# Patient Record
Sex: Female | Born: 1985 | Race: Black or African American | Hispanic: No | Marital: Single | Smoking: Never smoker
Health system: Southern US, Community
[De-identification: ages and names within clinical notes are randomized; demographics above are authoritative.]

## PROBLEM LIST (undated history)

## (undated) DIAGNOSIS — F419 Anxiety disorder, unspecified: Secondary | ICD-10-CM

## (undated) DIAGNOSIS — R269 Unspecified abnormalities of gait and mobility: Secondary | ICD-10-CM

## (undated) DIAGNOSIS — R7303 Prediabetes: Secondary | ICD-10-CM

## (undated) DIAGNOSIS — R569 Unspecified convulsions: Secondary | ICD-10-CM

## (undated) DIAGNOSIS — G259 Extrapyramidal and movement disorder, unspecified: Secondary | ICD-10-CM

## (undated) DIAGNOSIS — G809 Cerebral palsy, unspecified: Secondary | ICD-10-CM

## (undated) HISTORY — DX: Extrapyramidal and movement disorder, unspecified: G25.9

## (undated) HISTORY — PX: CYSTECTOMY: SUR359

## (undated) HISTORY — DX: Unspecified abnormalities of gait and mobility: R26.9

---
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
## 2011-02-28 DIAGNOSIS — E282 Polycystic ovarian syndrome: Secondary | ICD-10-CM | POA: Insufficient documentation

## 2011-03-30 ENCOUNTER — Ambulatory Visit: Payer: Medicaid Other | Attending: Physical Medicine and Rehabilitation | Admitting: Physical Therapy

## 2011-03-30 DIAGNOSIS — R269 Unspecified abnormalities of gait and mobility: Secondary | ICD-10-CM | POA: Insufficient documentation

## 2011-03-30 DIAGNOSIS — IMO0001 Reserved for inherently not codable concepts without codable children: Secondary | ICD-10-CM | POA: Insufficient documentation

## 2011-04-19 ENCOUNTER — Ambulatory Visit: Payer: Medicaid Other | Attending: Physical Medicine and Rehabilitation | Admitting: Physical Therapy

## 2011-04-19 DIAGNOSIS — IMO0001 Reserved for inherently not codable concepts without codable children: Secondary | ICD-10-CM | POA: Insufficient documentation

## 2011-04-19 DIAGNOSIS — R269 Unspecified abnormalities of gait and mobility: Secondary | ICD-10-CM | POA: Insufficient documentation

## 2011-05-02 DIAGNOSIS — G250 Essential tremor: Secondary | ICD-10-CM | POA: Insufficient documentation

## 2011-05-02 DIAGNOSIS — G809 Cerebral palsy, unspecified: Secondary | ICD-10-CM | POA: Insufficient documentation

## 2011-05-03 ENCOUNTER — Ambulatory Visit: Payer: Medicaid Other | Admitting: Physical Therapy

## 2011-05-15 DIAGNOSIS — Z8659 Personal history of other mental and behavioral disorders: Secondary | ICD-10-CM | POA: Insufficient documentation

## 2011-05-15 DIAGNOSIS — F411 Generalized anxiety disorder: Secondary | ICD-10-CM | POA: Insufficient documentation

## 2011-05-15 DIAGNOSIS — G40A09 Absence epileptic syndrome, not intractable, without status epilepticus: Secondary | ICD-10-CM | POA: Insufficient documentation

## 2011-05-17 ENCOUNTER — Encounter: Payer: Medicaid Other | Admitting: Physical Therapy

## 2011-05-30 ENCOUNTER — Ambulatory Visit: Payer: Medicaid Other | Attending: Physical Medicine and Rehabilitation | Admitting: Physical Therapy

## 2011-05-30 DIAGNOSIS — IMO0001 Reserved for inherently not codable concepts without codable children: Secondary | ICD-10-CM | POA: Insufficient documentation

## 2011-05-30 DIAGNOSIS — R269 Unspecified abnormalities of gait and mobility: Secondary | ICD-10-CM | POA: Insufficient documentation

## 2012-10-23 ENCOUNTER — Emergency Department (HOSPITAL_COMMUNITY)
Admission: EM | Admit: 2012-10-23 | Discharge: 2012-10-23 | Disposition: A | Discharge status: Home / Self Care | Payer: Medicaid Other | Attending: Emergency Medicine | Admitting: Emergency Medicine

## 2012-10-23 ENCOUNTER — Emergency Department (HOSPITAL_COMMUNITY): Payer: Medicaid Other

## 2012-10-23 ENCOUNTER — Encounter (HOSPITAL_COMMUNITY): Payer: Self-pay | Admitting: Emergency Medicine

## 2012-10-23 DIAGNOSIS — Y9301 Activity, walking, marching and hiking: Secondary | ICD-10-CM | POA: Insufficient documentation

## 2012-10-23 DIAGNOSIS — S79919A Unspecified injury of unspecified hip, initial encounter: Secondary | ICD-10-CM | POA: Insufficient documentation

## 2012-10-23 DIAGNOSIS — X500XXA Overexertion from strenuous movement or load, initial encounter: Secondary | ICD-10-CM | POA: Insufficient documentation

## 2012-10-23 DIAGNOSIS — M79652 Pain in left thigh: Secondary | ICD-10-CM

## 2012-10-23 DIAGNOSIS — Y9229 Other specified public building as the place of occurrence of the external cause: Secondary | ICD-10-CM | POA: Insufficient documentation

## 2012-10-23 DIAGNOSIS — Z8669 Personal history of other diseases of the nervous system and sense organs: Secondary | ICD-10-CM | POA: Insufficient documentation

## 2012-10-23 HISTORY — DX: Unspecified convulsions: R56.9

## 2012-10-23 NOTE — ED Notes (Signed)
Notified RN of CBG 79

## 2012-10-23 NOTE — ED Provider Notes (Signed)
History     CSN: [id number]  Arrival date & time 10/23/12  1131   First MD Initiated Contact with Patient 10/23/12 1137      Chief Complaint  Patient presents with  . Fall    (Consider location/radiation/quality/duration/timing/severity/associated sxs/prior treatment) HPI Comments: Patient is a 27 year old female who presents with left thigh pain that started prior to arrival. Patient reports walking down the hall at school and having sudden onset of sharp, severe left thigh pain without radiation. She reports feeling a "pop" and her leg buckled. She caught herself and did not fall. She denies any known injury. No aggravating/alleviating factors. She did not take anything for pain. She denies current pain. Patient denies LOC or head trauma.    Past Medical History  Diagnosis Date  . Seizures     Past Surgical History  Procedure Laterality Date  . Cystectomy Left     leg    History reviewed. No pertinent family history.  History  Substance Use Topics  . Smoking status: Never Smoker   . Smokeless tobacco: Not on file  . Alcohol Use: No    OB History   Grav Para Term Preterm Abortions TAB SAB Ect Mult Living                  Review of Systems  Musculoskeletal: Positive for myalgias.  All other systems reviewed and are negative.    Allergies  Review of patient's allergies indicates no known allergies.  Home Medications  No current outpatient prescriptions on file.  BP 103/63  Pulse 51  Temp(Src) 98.3 F (36.8 C) (Oral)  Resp 18  SpO2 96%  Physical Exam  Nursing note and vitals reviewed. Constitutional: She is oriented to person, place, and time. She appears well-developed and well-nourished. No distress.  HENT:  Head: Normocephalic and atraumatic.  Eyes: Conjunctivae are normal.  Neck: Normal range of motion.  Cardiovascular: Normal rate, regular rhythm and intact distal pulses.  Exam reveals no gallop and no friction rub.   No murmur  heard. Pulmonary/Chest: Effort normal and breath sounds normal. She has no wheezes. She has no rales. She exhibits no tenderness.  Abdominal: Soft. She exhibits no distension. There is no tenderness. There is no rebound and no guarding.  Musculoskeletal: Normal range of motion.  No left thigh tenderness to palpation or obvious deformity.   Neurological: She is alert and oriented to person, place, and time. Coordination normal.  Strength and sensation equal and intact bilaterally. Speech is goal-oriented. Moves limbs without ataxia.   Skin: Skin is warm and dry.  Psychiatric: She has a normal mood and affect. Her behavior is normal.    ED Course  Procedures (including critical care time)  Labs Reviewed - No data to display Dg Hip Complete Left  10/23/2012  *RADIOLOGY REPORT*  Clinical Data: Fall, pain.  LEFT HIP - COMPLETE 2+ VIEW  Comparison: None.  Findings: Imaged bones, joints and soft tissues appear normal.  IMPRESSION: Negative exam.   Original Report Authenticated By: Holley Dexter, M.D.    Dg Femur Left  10/23/2012  *RADIOLOGY REPORT*  Clinical Data: Fall, pain.  LEFT FEMUR - 2 VIEW  Comparison: None.  Findings: Imaged bones, joints and soft tissues appear normal.  IMPRESSION: Normal study.   Original Report Authenticated By: Holley Dexter, M.D.      1. Left thigh pain       MDM  12:03 PM Xray pending. Patient declines pain medication at this time.  1:44 PM Xrays unremarkable for acute changes. Patient will be discharged without further evaluation. Patient denies current pain. Patient instructed to follow up with PCP for further evaluation as needed. Patient instructed to return to the ED with worsening or concerning symptoms. No signs of neurovascular compromise, no calf or popliteal tenderness.         Emilia Beck, PA-C 10/23/12 1356

## 2012-10-23 NOTE — ED Notes (Signed)
Patient transported to X-ray 

## 2012-10-23 NOTE — ED Notes (Signed)
BIB [facility]. Patient was at school [employer]), walking out of restroom when she felt her left leg "pop", causing her to buckle and loose her gait. Patient did not completely fall to ground. Hx of seizures (on Keppra), patient states she does not feel like she had a seizure.

## 2012-10-25 NOTE — ED Provider Notes (Signed)
Medical screening examination/treatment/procedure(s) were performed by non-physician practitioner and as supervising physician I was immediately available for consultation/collaboration.   Laray Anger, DO 10/25/12 1159

## 2012-12-03 ENCOUNTER — Ambulatory Visit (INDEPENDENT_AMBULATORY_CARE_PROVIDER_SITE_OTHER): Payer: Medicaid Other | Admitting: Nurse Practitioner

## 2012-12-03 ENCOUNTER — Encounter: Payer: Self-pay | Admitting: Nurse Practitioner

## 2012-12-03 VITALS — BP 111/76 | HR 87 | Ht 67.75 in | Wt 268.0 lb

## 2012-12-03 DIAGNOSIS — G250 Essential tremor: Secondary | ICD-10-CM | POA: Insufficient documentation

## 2012-12-03 DIAGNOSIS — G40209 Localization-related (focal) (partial) symptomatic epilepsy and epileptic syndromes with complex partial seizures, not intractable, without status epilepticus: Secondary | ICD-10-CM | POA: Insufficient documentation

## 2012-12-03 DIAGNOSIS — R269 Unspecified abnormalities of gait and mobility: Secondary | ICD-10-CM | POA: Insufficient documentation

## 2012-12-03 DIAGNOSIS — G252 Other specified forms of tremor: Secondary | ICD-10-CM

## 2012-12-03 MED ORDER — LEVETIRACETAM 1000 MG PO TABS: 1000.0000 mg | ORAL_TABLET | Freq: Two times a day (BID) | ORAL | Status: DC

## 2012-12-03 NOTE — Progress Notes (Signed)
HPI: Patient returns for followup after last visit with Dr. Anne Hahn 06/06/2012. She has a history of gait disorder and some cognitive slowing. EEG studies have been abnormal suggesting partial complex seizures. Patient is presently on Keppra thousand milligrams twice daily tolerating the medication without side effects or irritability. Patient is still somewhat tremulous, particularly of the right arm. Patient is not operating a motor vehicle. She had an episode of severe pain in the left leg back in April, went to the emergency room and x-ray of the hip and femur were both normal. She has not had recurrent episodes. She has no new neurologic complaints    ROS:   joint pain, tremor   Physical Exam General: well developed, obese female, seated, in no evident distress Head: head normocephalic and atraumatic. Oropharynx benign Neck: supple with no carotid  bruits Cardiovascular: regular rate and rhythm, no murmurs  Neurologic Exam Mental Status: Awake and fully alert. Oriented to place and time. Follows all commands. Speech is normal no aphasia or dysarthria is noted .  Cranial Nerves: Pupils equal, briskly reactive to light. Extraocular movements full without nystagmus. Visual fields full to confrontation. Hearing intact and symmetric to finger snap. Facial sensation intact. Face, tongue, palate move normally and symmetrically. Neck flexion and extension normal.  Motor: Normal bulk and tone. Normal strength in all tested extremity muscles. Sensory.: intact to touch and pinprick and vibratory.  Coordination: Rapid alternating movements normal in all extremities. Finger-to-nose and heel-to-shin performed accurately bilaterally. The patient is somewhat tremulous with arms Gait and Station: Arises from chair without difficulty. Stance is normal.  Able to heel, toe and slightly unsteady with tandem walk   Reflexes: 2+ and symmetric. Toes downgoing.     ASSESSMENT: Seizure disorder well  controlled Mild cognitive slowing     PLAN: Continue Keppra thousand twice daily Will renew. The patient is not operating a vehicle Call for any seizure activity Encouraged weight loss, nutritious diet, and exercise program for overall general health Followup in 6 months  Nilda Riggs, GNP-BC APRN

## 2012-12-03 NOTE — Progress Notes (Signed)
I have read the note, and I agree with the clinical assessment and plan.  

## 2012-12-03 NOTE — Patient Instructions (Addendum)
Will continue Keppra 1000 mg twice daily, we will renew Followup in 6 months Call for any seizure activity

## 2013-06-10 ENCOUNTER — Encounter (INDEPENDENT_AMBULATORY_CARE_PROVIDER_SITE_OTHER): Payer: Self-pay

## 2013-06-10 ENCOUNTER — Ambulatory Visit (INDEPENDENT_AMBULATORY_CARE_PROVIDER_SITE_OTHER): Payer: Medicaid Other | Admitting: Nurse Practitioner

## 2013-06-10 ENCOUNTER — Encounter: Payer: Self-pay | Admitting: Nurse Practitioner

## 2013-06-10 VITALS — BP 110/75 | HR 86 | Ht 68.0 in | Wt 255.0 lb

## 2013-06-10 DIAGNOSIS — G250 Essential tremor: Secondary | ICD-10-CM

## 2013-06-10 DIAGNOSIS — G40209 Localization-related (focal) (partial) symptomatic epilepsy and epileptic syndromes with complex partial seizures, not intractable, without status epilepticus: Secondary | ICD-10-CM

## 2013-06-10 DIAGNOSIS — R269 Unspecified abnormalities of gait and mobility: Secondary | ICD-10-CM

## 2013-06-10 MED ORDER — LEVETIRACETAM 1000 MG PO TABS: 1000.0000 mg | ORAL_TABLET | Freq: Two times a day (BID) | ORAL | Status: DC

## 2013-06-10 NOTE — Progress Notes (Signed)
I have read the note, and I agree with the clinical assessment and plan.  WILLIS,CHARLES KEITH   

## 2013-06-10 NOTE — Progress Notes (Signed)
[facility]  PATIENT: Shannon Travis DOB: [birth year]   REASON FOR VISIT: follow up for seizure disorder    HISTORY OF PRESENT ILLNESS:Shannon Travis, 27 year old returns for followup. She was last seen in the office 5/ 21/ 2014 areas it is no more than a year since she had seizure activity. She is currently well controlled on Keppra thousand milligrams twice daily. Patient is not operating a motor vehicle and has never learned to drive. She has no new neurologic complaints   HISTORY: She has a history of gait disorder and some cognitive slowing. EEG studies have been abnormal suggesting partial complex seizures. Patient is presently on Keppra thousand milligrams twice daily tolerating the medication without side effects or irritability. Patient is still somewhat tremulous, particularly of the right arm. Patient is not operating a motor vehicle. She had an episode of severe pain in the left leg back in April, went to the emergency room and x-ray of the hip and femur were both normal. She has not had recurrent episodes. She has no new neurologic complaints. 2012  EEG recording is abnormal secondary to bursts of sharp and slow-wave complexes consistent with generalized seizure discharges. 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     REVIEW OF SYSTEMS: Full 14 system review of systems performed and notable only for: all negative Constitutional: N/A  Cardiovascular: N/A  Ear/Nose/Throat: N/A  Skin: N/A  Eyes: N/A  Respiratory: N/A  Gastroitestinal: N/A  Hematology/Lymphatic: N/A  Endocrine: N/A Musculoskeletal:N/A  Allergy/Immunology: N/A  Neurological: N/A Psychiatric: N/A   ALLERGIES: No Known Allergies  HOME MEDICATIONS: Outpatient Prescriptions Prior to Visit  Medication Sig Dispense Refill  . levETIRAcetam  (KEPPRA) 1000 MG tablet Take 1 tablet (1,000 mg total) by mouth 2 (two) times daily.  60 tablet  6  . MedroxyPROGESTERone Acetate (DEPO-PROVERA IM) Inject into the muscle every 3 (three) months.       No facility-administered medications prior to visit.    PAST MEDICAL HISTORY: Past Medical History  Diagnosis Date  . Seizures   . Abnormality of gait   . Movement disorder     PAST SURGICAL HISTORY: Past Surgical History  Procedure Laterality Date  . Cystectomy Left     leg    FAMILY HISTORY: History reviewed. No pertinent family history.  SOCIAL HISTORY: History   Social History  . Marital Status: Single    Spouse Name: N/A    Number of Children: 0  . Years of Education: 12   Occupational History  . Not on file.   Social History Main Topics  . Smoking status: Never Smoker   . Smokeless tobacco: Never Used  . Alcohol Use: No  . Drug Use: No  . Sexual Activity: Not on file   Other Topics Concern  . Not on file   Social History Narrative   Patient lives at home with her mother Shannon Travis.    Patient is currently working part time.    Patient is single.    Patient has no children.    Patient has high school education.      PHYSICAL EXAM  Filed Vitals:   06/10/13 0938  BP: 110/75  Pulse: 86  Height: 5\' 8"  (1.727 m)  Weight: 255 lb (115.667 kg)   Body mass index is 38.78 kg/(m^2).  Generalized: Well developed, in no  acute distress   Neurological examination   Mentation: Alert oriented to time, place, history taking. Follows all commands speech and language fluent  Cranial nerve II-XII: Pupils were equal round reactive to light extraocular movements were full, visual field were full on confrontational test. Facial sensation and strength were normal. hearing was intact to finger rubbing bilaterally. Uvula tongue midline. head turning and shoulder shrug and were normal and symmetric.Tongue protrusion into cheek strength was normal. Motor: normal  bulk and tone, full strength in the BUE, BLE, fine finger movements normal, no pronator drift. No focal weakness Sensory: normal and symmetric to light touch, pinprick, and  vibration  Coordination: finger-nose-finger, heel-to-shin bilaterally, she is somewhat tremulous with the use of her right arm Reflexes: Brachioradialis 2/2, biceps 2/2, triceps 2/2, patellar 2/2, Achilles 2/2, plantar responses were flexor bilaterally. Gait and Station: Rising up from seated position without assistance, normal stance,  moderate stride, good arm swing, smooth turning, able to perform tiptoe, and heel walking without difficulty. Slightly unsteady with tandem  DIAGNOSTIC DATA (LABS, IMAGING, TESTING)  none to review  ASSESSMENT AND PLAN  27 y.o. year old female  has a past medical history of Seizures; Abnormality of gait; and Movement disorder. here to followup for seizure disorder. Currently well controlled on Keppra thousand twice daily.  Continue Keppra at current dose, will refill for 1 year F/U yearly and prn Shannon Travis, [facility], [facility], APRN  [facility] [address] [place], [place] [address] [phone]

## 2013-06-10 NOTE — Patient Instructions (Signed)
Continue Keppra at current dose, will refill for 1 year F/U yearly and prn

## 2013-09-07 ENCOUNTER — Telehealth: Payer: Self-pay | Admitting: Nurse Practitioner

## 2013-09-07 NOTE — Telephone Encounter (Signed)
[facility] year [facility]  I called back.  Mom said she did nt speak with the pharmacy, but will call them now and have them call us back if needed.

## 2013-09-07 NOTE — Telephone Encounter (Signed)
Pt's mom called, pt needs her levETIRAcetam (KEPPRA) 1000 MG tablet refilled please contact mom when called in pt is in school per mom.

## 2014-06-11 ENCOUNTER — Ambulatory Visit: Payer: Medicaid Other | Admitting: Nurse Practitioner

## 2014-06-17 ENCOUNTER — Encounter: Payer: Self-pay | Admitting: Nurse Practitioner

## 2014-06-28 ENCOUNTER — Other Ambulatory Visit: Payer: Self-pay | Admitting: Nurse Practitioner

## 2014-06-28 NOTE — Telephone Encounter (Signed)
Has appt in Feb

## 2014-08-11 ENCOUNTER — Ambulatory Visit: Payer: Medicaid Other | Admitting: Nurse Practitioner

## 2014-08-26 ENCOUNTER — Ambulatory Visit (INDEPENDENT_AMBULATORY_CARE_PROVIDER_SITE_OTHER): Payer: Medicaid Other | Admitting: Nurse Practitioner

## 2014-08-26 ENCOUNTER — Encounter: Payer: Self-pay | Admitting: Nurse Practitioner

## 2014-08-26 VITALS — BP 133/84 | HR 105 | Ht 68.0 in | Wt 275.0 lb

## 2014-08-26 DIAGNOSIS — R269 Unspecified abnormalities of gait and mobility: Secondary | ICD-10-CM

## 2014-08-26 DIAGNOSIS — G40209 Localization-related (focal) (partial) symptomatic epilepsy and epileptic syndromes with complex partial seizures, not intractable, without status epilepticus: Secondary | ICD-10-CM

## 2014-08-26 MED ORDER — LEVETIRACETAM 1000 MG PO TABS: 1000.0000 mg | ORAL_TABLET | Freq: Two times a day (BID) | ORAL | Status: DC

## 2014-08-26 NOTE — Patient Instructions (Signed)
Continue Keppra at current dose will refill for 1 year F/U yearly and when necessary

## 2014-08-26 NOTE — Progress Notes (Signed)
[facility]  PATIENT: Shannon Travis DOB: 04/16/1986   REASON FOR VISIT: follow up for seizure disorder HISTORY FROM:patient, MOM    HISTORY OF PRESENT ILLNESS:Shannon Travis, 29 year old returns for followup. She was last seen in the office 06/10/13. She has a history of a seizure disorder but has not had seizures in several years.  She is currently well controlled on Keppra thousand milligrams twice daily. Patient is not operating a motor vehicle and has never learned to drive. She has no new neurologic complaints   HISTORY: She has a history of gait disorder and some cognitive slowing. EEG studies have been abnormal suggesting partial complex seizures. Patient is presently on Keppra thousand milligrams twice daily tolerating the medication without side effects or irritability. Patient is still somewhat tremulous, particularly of the right arm. Patient is not operating a motor vehicle. She had an episode of severe pain in the left leg back in April, went to the emergency room and x-ray of the hip and femur were both normal. She has not had recurrent episodes. She has no new neurologic complaints. 2012 EEG recording is abnormal secondary to bursts of sharp and slow-wave complexes consistent with generalized seizure discharges. 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   REVIEW OF SYSTEMS: Full 14 system review of systems performed and notable only for those listed, all others are neg:  Constitutional: neg  Cardiovascular: neg Ear/Nose/Throat: neg  Skin: neg Eyes: neg Respiratory: neg Gastroitestinal: neg  Hematology/Lymphatic: neg  Endocrine: neg Musculo skeletal Walking difficulty Allergy/Immunology: neg Neurological Seizure disorder, tremors Psychiatric: neg Sleep : neg   ALLERGIES: No Known Allergies  HOME  MEDICATIONS: Outpatient Prescriptions Prior to Visit  Medication Sig Dispense Refill  . levETIRAcetam (KEPPRA) 1000 MG tablet TAKE ONE TABLET BY MOUTH TWICE DAILY 60 tablet 1  . MedroxyPROGESTERone Acetate (DEPO-PROVERA IM) Inject into the muscle every 3 (three) months.     No facility-administered medications prior to visit.    PAST MEDICAL HISTORY: Past Medical History  Diagnosis Date  . Seizures   . Abnormality of gait   . Movement disorder     PAST SURGICAL HISTORY: Past Surgical History  Procedure Laterality Date  . Cystectomy Left     leg    FAMILY HISTORY: Family History  Problem Relation Age of Onset  . Healthy Mother     SOCIAL HISTORY: History   Social History  . Marital Status: Single    Spouse Name: N/A  . Number of Children: 0  . Years of Education: 12   Occupational History  . Not on file.   Social History Main Topics  . Smoking status: Never Smoker   . Smokeless tobacco: Never Used  . Alcohol Use: No  . Drug Use: No  . Sexual Activity: Not on file   Other Topics Concern  . Not on file   Social History Narrative   Patient lives at home with her mother Shannon Travis.    Patient is currently working part time.    Patient is single.    Patient has no children.    Patient has high school education.      PHYSICAL EXAM  Filed Vitals:   08/26/14 1517  BP: 133/84  Pulse: 105  Height:  (1.727 m)  Weight: 275 lb (124.739 kg)   Body mass index is  41.82 kg/(m^2).  Generalized: Well developed, in no acute distress  Musculoskeletal: No deformity   Neurological examination  Mentation: Alert oriented to time, place, history taking. Attention span and concentration appropriate. Recent and remote memory intact.  Follows all commands speech and language fluent.  Cranial nerve II-XII:  Pupils were equal round reactive to light extraocular movements were full, visual field were full on confrontational test. Facial sensation and strength  were normal. hearing was intact to finger rubbing bilaterally. Uvula tongue midline. head turning and shoulder shrug were normal and symmetric.Tongue protrusion into cheek strength was normal. Motor: normal bulk and tone, full strength in the BUE, BLE, fine finger movements normal, no pronator drift. No focal weakness Sensory: normal and symmetric to light touch, pinprick, and  Vibration, proprioception  Coordination: finger-nose-finger, heel-to-shin bilaterally, no dysmetria, she is somewhat tremulous with the use of her right arm  Reflexes: Brachioradialis 2/2, biceps 2/2, triceps 2/2, patellar 2/2, Achilles 2/2, plantar responses were flexor bilaterally. Gait and Station: Rising up from seated position without assistance, normal stance,  moderate stride, good arm swing, smooth turning, able to perform tiptoe, and heel walking without difficulty. Tandem gait is mildly unsteady  DIAGNOSTIC DATA (LABS, IMAGING, TESTING) - ASSESSMENT AND PLAN  29 y.o. year old female  has a past medical history of Seizures; Abnormality of gait; and Movement disorder. here  to follow-up. Last seizure was several years ago currently well-controlled on Keppra thousand milligrams twice daily.  Continue Keppra at current dose will refill for 1 year F/U yearly and when necessary Nilda RiggsNancy Carolyn Martin, [facility]NP, [facility], APRN  [facility] [address], [address] [place], [place] [address] [phone]

## 2014-08-26 NOTE — Progress Notes (Signed)
I have read the note, and I agree with the clinical assessment and plan.  WILLIS,CHARLES KEITH   

## 2014-12-20 DIAGNOSIS — Z0289 Encounter for other administrative examinations: Secondary | ICD-10-CM | POA: Diagnosis not present

## 2015-08-23 ENCOUNTER — Telehealth: Payer: Self-pay | Admitting: *Deleted

## 2015-08-23 NOTE — Telephone Encounter (Signed)
Form,[place] [employer] assistant, radio received from front desk sent to [place] and Eber Jones 08/23/15.

## 2015-08-25 NOTE — Telephone Encounter (Signed)
Pt has appt 08-30-15 0800 will address when in.

## 2015-08-29 ENCOUNTER — Ambulatory Visit: Payer: Medicaid Other | Admitting: Nurse Practitioner

## 2015-08-30 ENCOUNTER — Ambulatory Visit (INDEPENDENT_AMBULATORY_CARE_PROVIDER_SITE_OTHER): Payer: Medicaid Other | Admitting: Nurse Practitioner

## 2015-08-30 ENCOUNTER — Encounter: Payer: Self-pay | Admitting: Nurse Practitioner

## 2015-08-30 VITALS — BP 116/80 | HR 92 | Ht 68.0 in | Wt 270.8 lb

## 2015-08-30 DIAGNOSIS — Z8659 Personal history of other mental and behavioral disorders: Secondary | ICD-10-CM | POA: Diagnosis not present

## 2015-08-30 DIAGNOSIS — G40209 Localization-related (focal) (partial) symptomatic epilepsy and epileptic syndromes with complex partial seizures, not intractable, without status epilepticus: Secondary | ICD-10-CM

## 2015-08-30 DIAGNOSIS — G809 Cerebral palsy, unspecified: Secondary | ICD-10-CM | POA: Diagnosis not present

## 2015-08-30 DIAGNOSIS — R269 Unspecified abnormalities of gait and mobility: Secondary | ICD-10-CM | POA: Diagnosis not present

## 2015-08-30 MED ORDER — LEVETIRACETAM 1000 MG PO TABS: 1000.0000 mg | ORAL_TABLET | Freq: Two times a day (BID) | ORAL | Status: DC

## 2015-08-30 NOTE — Progress Notes (Signed)
[facility]  PATIENT: Shannon Travis DOB: 1985/11/07   REASON FOR VISIT: Follow-up for epilepsy, abnormality of gait, cognitive slowing HISTORY FROM: Patient, mom    HISTORY OF PRESENT ILLNESS:Shannon Travis, 30 year old returns for followup. She was last seen in the office 08/31/14.  She has a history of a seizure disorder but has not had seizures in several years. She is currently well controlled on Keppra  twice daily. Patient is not operating a motor vehicle and has never learned to drive. Patient is somewhat tremulous intermittently. She has no new neurologic complaints   HISTORY: She has a history of gait disorder and some cognitive slowing. EEG studies have been abnormal suggesting partial complex seizures. Patient is presently on Keppra thousand milligrams twice daily tolerating the medication without side effects or irritability. Patient is still somewhat tremulous, particularly of the right arm. Patient is not operating a motor vehicle. She had an episode of severe pain in the left leg back in April, went to the emergency room and x-ray of the hip and femur were both normal. She has not had recurrent episodes. She has no new neurologic complaints. 2012 EEG recording is abnormal secondary to bursts of sharp and slow-wave complexes consistent with generalized seizure discharges. 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    REVIEW OF SYSTEMS: Full 14 system review of systems performed and notable only for those listed, all others are neg:  Constitutional: neg  Cardiovascular: neg Ear/Nose/Throat: neg  Skin: neg Eyes: neg Respiratory: neg Gastroitestinal: neg  Hematology/Lymphatic: neg  Endocrine: neg Musculoskeletal: Walking difficulty Allergy/Immunology: neg Neurological: History of seizure Psychiatric:  neg Sleep : Snoring   ALLERGIES: No Known Allergies  HOME MEDICATIONS: Outpatient Prescriptions Prior to Visit  Medication Sig Dispense Refill  . Cholecalciferol (VITAMIN D3) 400 UNITS CAPS Take 1 tablet by mouth daily.    Marland Kitchen levETIRAcetam (KEPPRA) 1000 MG tablet Take 1 tablet (1,000 mg total) by mouth 2 (two) times daily. 60 tablet 11  . MedroxyPROGESTERone Acetate (DEPO-PROVERA IM) Inject into the muscle every 3 (three) months.     No facility-administered medications prior to visit.    PAST MEDICAL HISTORY: Past Medical History  Diagnosis Date  . Seizures (HCC)   . Abnormality of gait   . Movement disorder     PAST SURGICAL HISTORY: Past Surgical History  Procedure Laterality Date  . Cystectomy Left     leg    FAMILY HISTORY: Family History  Problem Relation Age of Onset  . Healthy Mother     SOCIAL HISTORY: Social History   Social History  . Marital Status: Single    Spouse Name: N/A  . Number of Children: 0  . Years of Education: 12   Occupational History  . Not on file.   Social History Main Topics  . Smoking status: Never Smoker   . Smokeless tobacco: Never Used  . Alcohol Use: No  . Drug Use: No  . Sexual Activity: Not on file   Other Topics Concern  . Not on file   Social History Narrative   Patient lives at home with her mother Shannon Travis.    Patient is currently working part time.    Patient is single.    Patient has no children.    Patient has high school education.      PHYSICAL EXAM  Filed Vitals:   08/30/15 0757  BP: 116/80  Pulse: 92  Height:  (1.727 m)  Weight: 270 lb 12.8 oz (122.834 kg)   Body mass index is 41.18 kg/(m^2). Generalized: Well developed, in no acute distress  Musculoskeletal: No deformity   Neurological examination  Mentation: Alert oriented to time, place, history taking. Attention span and concentration appropriate. Recent and remote memory intact. Follows all commands speech and language  fluent.  Cranial nerve II-XII: Pupils were equal round reactive to light extraocular movements were full, visual field were full on confrontational test. Facial sensation and strength were normal. hearing was intact to finger rubbing bilaterally. Uvula tongue midline. head turning and shoulder shrug were normal and symmetric.Tongue protrusion into cheek strength was normal. Motor: normal bulk and tone, full strength in the BUE, BLE, fine finger movements normal, no pronator drift. No focal weakness Sensory: normal and symmetric to light touch, pinprick, and Vibration, proprioception  Coordination: finger-nose-finger, heel-to-shin bilaterally, no dysmetria, she is somewhat tremulous with the use of her right arm  Reflexes: Brachioradialis 2/2, biceps 2/2, triceps 2/2, patellar 2/2, Achilles 2/2, plantar responses were flexor bilaterally. Gait and Station: Rising up from seated position without assistance, normal stance, moderate stride, good arm swing, smooth turning, able to perform tiptoe, and heel walking without difficulty. Tandem gait is unsteady  DIAGNOSTIC DATA (LABS, IMAGING, TESTING) - ASSESSMENT AND PLAN 30 y.o. year old female  has a past medical history of Seizures; Abnormality of gait; and Movement disorder and cognitive slowing here to follow-up. Last seizure was several years ago currently well-controlled on Keppra  twice daily.  Continue Keppra at current dose will refill for 1 year Call for any seizure activity Labs are followed by primary care provider F/U yearly and when necessary SCAT form filled out Nilda Riggs, [place], [facility], APRN  [facility] [address], [address] [place], [place] [address] [phone]

## 2015-08-30 NOTE — Patient Instructions (Signed)
Continue Keppra at current dose will refill for 1 year F/U yearly and when necessary Call for seizure activity SCAT form filled out

## 2015-08-30 NOTE — Progress Notes (Signed)
I have read the note, and I agree with the clinical assessment and plan.  Chesney Klimaszewski KEITH   

## 2015-08-30 NOTE — Telephone Encounter (Signed)
Scat form completed.  Review and signed by CM/NP.   To MR.

## 2015-08-31 ENCOUNTER — Telehealth: Payer: Self-pay | Admitting: *Deleted

## 2015-08-31 NOTE — Telephone Encounter (Signed)
Form,[employer] received,completed by Eber Jones and Dex[place] she gave to patient at appointment 08/30/15.

## 2016-06-04 ENCOUNTER — Encounter: Payer: Self-pay | Admitting: Nurse Practitioner

## 2016-08-23 ENCOUNTER — Encounter: Payer: Self-pay | Admitting: Nurse Practitioner

## 2016-08-23 ENCOUNTER — Ambulatory Visit (INDEPENDENT_AMBULATORY_CARE_PROVIDER_SITE_OTHER): Payer: Medicaid Other | Admitting: Nurse Practitioner

## 2016-08-23 VITALS — BP 110/70 | Ht 68.0 in | Wt 260.6 lb

## 2016-08-23 DIAGNOSIS — G809 Cerebral palsy, unspecified: Secondary | ICD-10-CM | POA: Diagnosis not present

## 2016-08-23 DIAGNOSIS — R269 Unspecified abnormalities of gait and mobility: Secondary | ICD-10-CM

## 2016-08-23 DIAGNOSIS — G40209 Localization-related (focal) (partial) symptomatic epilepsy and epileptic syndromes with complex partial seizures, not intractable, without status epilepticus: Secondary | ICD-10-CM | POA: Diagnosis not present

## 2016-08-23 DIAGNOSIS — Z8659 Personal history of other mental and behavioral disorders: Secondary | ICD-10-CM

## 2016-08-23 MED ORDER — LEVETIRACETAM 1000 MG PO TABS: 1000.0000 mg | ORAL_TABLET | Freq: Two times a day (BID) | ORAL | 11 refills | Status: DC

## 2016-08-23 NOTE — Progress Notes (Signed)
[facility]  PATIENT: Shannon Travis DOB: [birth year]   REASON FOR VISIT: Follow-up for epilepsy, abnormality of gait, cognitive slowing HISTORY FROM: Patient, mom    HISTORY OF PRESENT ILLNESS:Shannon Travis, 31 year old female  returns for yearly followup.   She has a history of a seizure disorder but has not had seizures in several years. She is currently well controlled on Keppra 1000mg  twice daily. She denies any side effects to the drug any irritability Patient is not operating a motor vehicle and has never learned to drive. Patient is somewhat tremulous intermittently. 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 .MRI of the brain that shows punctate white matter changes that are chronic in nature.She has no new neurologic complaints  REVIEW OF SYSTEMS: Full 14 system review of systems performed and notable only for those listed, all others are neg:  Constitutional: neg  Cardiovascular: neg Ear/Nose/Throat: neg  Skin: neg Eyes: neg Respiratory: neg Gastroitestinal: neg  Hematology/Lymphatic: neg  Endocrine: neg Musculoskeletal: Walking difficulty Allergy/Immunology: neg Neurological: History of seizure Psychiatric: neg Sleep : Snoring   ALLERGIES: No Known Allergies  HOME MEDICATIONS: Outpatient Medications Prior to Visit  Medication Sig Dispense Refill  . Cholecalciferol (VITAMIN D3) 400 UNITS CAPS Take 1 tablet by mouth daily.    Marland Kitchen levETIRAcetam (KEPPRA) 1000 MG tablet Take 1 tablet (1,000 mg total) by mouth 2 (two) times daily. 60 tablet 11  . MedroxyPROGESTERone Acetate (DEPO-PROVERA IM) Inject into the muscle every 3 (three) months.     No facility-administered medications prior to visit.     PAST MEDICAL HISTORY: Past Medical History:  Diagnosis Date  . Abnormality of gait   . Movement disorder   . Seizures (HCC)     PAST SURGICAL  HISTORY: Past Surgical History:  Procedure Laterality Date  . CYSTECTOMY Left    leg    FAMILY HISTORY: Family History  Problem Relation Age of Onset  . Healthy Mother     SOCIAL HISTORY: Social History   Social History  . Marital status: Single    Spouse name: N/A  . Number of children: 0  . Years of education: 12   Occupational History  . Not on file.   Social History Main Topics  . Smoking status: Never Smoker  . Smokeless tobacco: Never Used  . Alcohol use No  . Drug use: No  . Sexual activity: Not on file   Other Topics Concern  . Not on file   Social History Narrative   Patient lives at home with her mother Hollie Beach.    Patient is currently working part time.    Patient is single.    Patient has no children.    Patient has high school education.      PHYSICAL EXAM  Vitals:   08/23/16 1338  Weight: 260 lb 9.6 oz (118.2 kg)  Height: 5\' 8"  (1.727 m)   Body mass index is 39.62 kg/m.  Generalized: Well developed,Obese female in no acute distress  Head: normocephalic and atraumatic,. Oropharynx benign  Neck: Supple,   Musculoskeletal: No deformity   Neurological examination   Mentation: Alert oriented to time, place, history taking. Attention span and concentration appropriate. Recent and remote memory intact.  Follows all commands speech and language fluent.   Cranial nerve II-XII:.Pupils were equal round reactive to light extraocular movements were full, visual field were full on confrontational  test. Facial sensation and strength were normal. hearing was intact to finger rubbing bilaterally. Uvula tongue midline. head turning and shoulder shrug were normal and symmetric.Tongue protrusion into cheek strength was normal. Motor: normal bulk and tone, full strength in the BUE, BLE, fine finger movements normal, no pronator drift. No focal weakness Sensory: normal and symmetric to light touch, pinprick, and  Vibration, in the upper and lower  extremities Coordination: finger-nose-finger, heel-to-shin bilaterally, no dysmetria, she is somewhat tremulous with the use of her right arm Reflexes: Brachioradialis 2/2, biceps 2/2, triceps 2/2, patellar 2/2, Achilles 2/2, plantar responses were flexor bilaterally. Gait and Station: Rising up from seated position without assistance, normal stance,  moderate stride, good arm swing, smooth turning, able to perform tiptoe, and heel walking without difficulty. Tandem gait is unsteady. No assistive device  DIAGNOSTIC DATA (LABS, IMAGING, TESTING) - ASSESSMENT AND PLAN 31 y.o. year old female  has a past medical history of Seizures; Abnormality of gait; and Movement disorder and cognitive slowing here to follow-up. Last seizure was more than 4 years ago according to mother.Currently well-controlled on Keppra 1000mg  twice daily.  Continue Keppra at current dose will refill for 1 year Call for any seizure activity Labs are followed by primary care provider F/U yearly and when necessary I spent 15 minutes in total face to face time with the patient more than 50% of which was spent counseling and coordination of care, reviewing test results reviewing medications and discussing and reviewing the diagnosis of seizure disorder and gait abnormality and answering additional questions Nilda RiggsNancy Carolyn Dierdra Salameh, [facility]NP, [facility], APRN  [facility] [address], [address] [place], [place] [address] [phone]

## 2016-08-23 NOTE — Patient Instructions (Signed)
Continue Keppra at current dose will refill for 1 year Call for any seizure activity Labs are followed by primary care provider F/U yearly and when necessary

## 2016-08-25 NOTE — Progress Notes (Signed)
I have read the note, and I agree with the clinical assessment and plan.  Odel Schmid KEITH   

## 2016-08-29 ENCOUNTER — Ambulatory Visit: Payer: Medicaid Other | Admitting: Nurse Practitioner

## 2017-08-05 ENCOUNTER — Encounter: Payer: Self-pay | Admitting: Nurse Practitioner

## 2017-08-23 ENCOUNTER — Ambulatory Visit: Payer: Medicaid Other | Admitting: Nurse Practitioner

## 2017-08-25 ENCOUNTER — Other Ambulatory Visit: Payer: Self-pay | Admitting: Nurse Practitioner

## 2017-10-21 NOTE — Progress Notes (Signed)
[facility]  PATIENT: Shannon Travis DOB: 1986/04/25   REASON FOR VISIT: Follow-up for epilepsy, abnormality of gait, cognitive slowing HISTORY FROM: Patient, mom    HISTORY OF PRESENT ILLNESS:Shannon Travis, 32 year old female  returns for yearly followup.   She has a history of a seizure disorder but has not had seizures in 5 years. She is currently well controlled on Keppra 1000mg  twice daily. She denies any side effects to the drug any irritability Patient is not operating a motor vehicle and has never learned to drive. Patient is somewhat tremulous intermittently. 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 She has no new neurologic complaints.   REVIEW OF SYSTEMS: Full 14 system review of systems performed and notable only for those listed, all others are neg:  Constitutional: neg  Cardiovascular: neg Ear/Nose/Throat: neg  Skin: neg Eyes: neg Respiratory: neg Gastroitestinal: neg  Hematology/Lymphatic: neg  Endocrine: neg Musculoskeletal: Walking difficulty Allergy/Immunology: neg Neurological: History of seizure, tremors Psychiatric: neg Sleep : Snoring   ALLERGIES: No Known Allergies  HOME MEDICATIONS: Outpatient Medications Prior to Visit  Medication Sig Dispense Refill  . Cholecalciferol (VITAMIN D3) 400 UNITS CAPS Take 1 tablet by mouth daily.    Marland Kitchen levETIRAcetam (KEPPRA) 1000 MG tablet TAKE 1 TABLET BY MOUTH TWICE DAILY 60 tablet 11  . MedroxyPROGESTERone Acetate (DEPO-PROVERA IM) Inject into the muscle every 3 (three) months.     No facility-administered medications prior to visit.     PAST MEDICAL HISTORY: Past Medical History:  Diagnosis Date  . Abnormality of gait   . Movement disorder   . Seizures (HCC)     PAST SURGICAL  HISTORY: Past Surgical History:  Procedure Laterality Date  . CYSTECTOMY Left    leg    FAMILY HISTORY: Family History  Problem Relation Age of Onset  . Healthy Mother     SOCIAL HISTORY: Social History   Socioeconomic History  . Marital status: Single    Spouse name: Not on file  . Number of children: 0  . Years of education: 51  . Highest education level: Not on file  Occupational History  . Not on file  Social Needs  . Financial resource strain: Not on file  . Food insecurity:    Worry: Not on file    Inability: Not on file  . Transportation needs:    Medical: Not on file    Non-medical: Not on file  Tobacco Use  . Smoking status: Never Smoker  . Smokeless tobacco: Never Used  Substance and Sexual Activity  . Alcohol use: No  . Drug use: No  . Sexual activity: Not on file  Lifestyle  . Physical activity:    Days per week: Not on file    Minutes per session: Not on file  . Stress: Not on file  Relationships  . Social connections:    Talks on phone: Not on file    Gets together: Not on file    Attends religious service: Not on file    Active member of club or organization: Not on file    Attends meetings of clubs or organizations: Not on file    Relationship status: Not on file  . Intimate partner violence:    Fear of current or ex partner:  Not on file    Emotionally abused: Not on file    Physically abused: Not on file    Forced sexual activity: Not on file  Other Topics Concern  . Not on file  Social History Narrative   Patient lives at home with her mother Shannon Travis.    Patient is currently working part time.    Patient is single.    Patient has no children.    Patient has high school education.      PHYSICAL EXAM  Vitals:   10/22/17 1021  BP: 120/81  Pulse: 91  Weight: 253 lb 12.8 oz (115.1 kg)  Height: 5\' 8"  (1.727 m)   Body mass index is 38.59 kg/m.  Generalized: Well developed,Obese female in no acute distress  Head:  normocephalic and atraumatic,. Oropharynx benign  Neck: Supple,   Musculoskeletal: No deformity   Neurological examination   Mentation: Alert oriented to time, place, history taking. Attention span and concentration appropriate. Recent and remote memory intact.  Follows all commands speech and language fluent.   Cranial nerve II-XII:.Pupils were equal round reactive to light extraocular movements were full, visual field were full on confrontational test. Facial sensation and strength were normal. hearing was intact to finger rubbing bilaterally. Uvula tongue midline. head turning and shoulder shrug were normal and symmetric.Tongue protrusion into cheek strength was normal. Motor: normal bulk and tone, full strength in the BUE, BLE, fine finger movements normal,  Sensory: normal and symmetric to light touch, pinprick, and  Vibration, in the upper and lower extremities Coordination: finger-nose-finger, heel-to-shin bilaterally, no dysmetria, she is somewhat tremulous with the use of her right arm Reflexes: Brachioradialis 2/2, biceps 2/2, triceps 2/2, patellar 2/2, Achilles 2/2, plantar responses were flexor bilaterally. Gait and Station: Rising up from seated position without assistance, normal stance,  moderate stride, good arm swing, smooth turning, able to perform tiptoe, and heel walking without difficulty. Tandem gait is unsteady. No assistive device  DIAGNOSTIC DATA (LABS, IMAGING, TESTING) - ASSESSMENT AND PLAN 32 y.o. year old female  has a past medical history of Seizures; Abnormality of gait; and Movement disorder and cognitive slowing here to follow-up. Last seizure was more than 5 years ago..Currently well-controlled on Keppra 1000mg  twice daily.  Continue Keppra at current dose will refill for 1 year Call for any seizure activity Labs are followed by primary care provider F/U yearly and when necessary I spent 15 minutes in total face to face time with the patient more than 50% of  which was spent counseling and coordination of care, reviewing test results reviewing medications and discussing and reviewing the diagnosis of seizure disorder and gait abnormality and answering additional questions Nilda RiggsNancy Carolyn Jerred Zaremba, [facility]GNP, [facility]BC, APRN  [facility] [address], [address] [place], [place] [address] [phone]

## 2017-10-22 ENCOUNTER — Encounter: Payer: Self-pay | Admitting: Nurse Practitioner

## 2017-10-22 ENCOUNTER — Ambulatory Visit: Payer: Medicaid Other | Admitting: Nurse Practitioner

## 2017-10-22 VITALS — BP 120/81 | HR 91 | Ht 68.0 in | Wt 253.8 lb

## 2017-10-22 DIAGNOSIS — G809 Cerebral palsy, unspecified: Secondary | ICD-10-CM | POA: Diagnosis not present

## 2017-10-22 DIAGNOSIS — G40209 Localization-related (focal) (partial) symptomatic epilepsy and epileptic syndromes with complex partial seizures, not intractable, without status epilepticus: Secondary | ICD-10-CM | POA: Diagnosis not present

## 2017-10-22 DIAGNOSIS — R269 Unspecified abnormalities of gait and mobility: Secondary | ICD-10-CM

## 2017-10-22 MED ORDER — LEVETIRACETAM 1000 MG PO TABS: 1000.0000 mg | ORAL_TABLET | Freq: Two times a day (BID) | ORAL | 3 refills | Status: DC

## 2017-10-22 NOTE — Progress Notes (Signed)
I have read the note, and I agree with the clinical assessment and plan.  Pleas Carneal K Tron Flythe   

## 2017-10-22 NOTE — Patient Instructions (Signed)
Continue Keppra at current dose will refill for 1 year Call for any seizure activity Labs are followed by primary care provider F/U yearly and when necessary

## 2018-09-10 ENCOUNTER — Other Ambulatory Visit: Payer: Self-pay | Admitting: Neurology

## 2018-09-17 ENCOUNTER — Telehealth: Payer: Self-pay | Admitting: Neurology

## 2018-09-17 MED ORDER — LEVETIRACETAM 1000 MG PO TABS: 1000.0000 mg | ORAL_TABLET | Freq: Two times a day (BID) | ORAL | 3 refills | Status: DC

## 2018-09-17 NOTE — Telephone Encounter (Signed)
I contacted pt per request and lvm advising rx has been sent.

## 2018-09-17 NOTE — Telephone Encounter (Signed)
Pt is needing a refill on her levETIRAcetam (KEPPRA) 1000 MG tablet sent to [facility] on [address]  Pt requested a call back to be informed that it has been called in.

## 2018-10-23 ENCOUNTER — Telehealth: Payer: Self-pay

## 2018-10-23 NOTE — Telephone Encounter (Signed)
I was able to speak with the patient to convert her office visit into a webex. Patient didn't want to make decision until she spoke with there mother.

## 2018-10-27 NOTE — Telephone Encounter (Signed)
10-27-2018 pt has called and given verbal consent for a virtual visit on04-14-2020 @8 :45am pt email: [email or web address] pt asked to download [employer] webex app/program [email or web address]

## 2018-10-27 NOTE — Progress Notes (Signed)
Virtual Visit via Video Note  I connected with Shannon Travis on 10/27/18 at  8:45 AM EDT by a video enabled telemedicine application and verified that I am speaking with the correct person using two identifiers.   I discussed the limitations of evaluation and management by telemedicine and the availability of in person appointments. The patient expressed understanding and agreed to proceed.  History of Present Illness: 10/28/2018 SS: Shannon Travis is a 33 year old female with a history of seizures, taking Keppra 1000 mg twice daily. She has history of gait disorder and cognitive slowing. Her seizures are under good control on current dose of Keppra. She does not operate a motor vehicle.  She has not had any recurrent seizures, does not remember when her last seizure was.  She currently lives at home with her mom, she has no work-related school.  She reports Monday she will watch TV, clean the house.  She denies any changes to her medications or medical history.  She is tolerating Keppra well, denies any episodes of irritability.  She reports a tremor occasionally in both hands when doing activity.  This does not affect her daily life.  She reports her gait has been doing well, she denies any falls.  She denies any problems or concerns. In the past she has had abnormal EEG suggesting complex partial seizures.   10/22/2017 CM: Shannon Travis, 33 year old female  returns for yearly followup.   She has a history of a seizure disorder but has not had seizures in 5 years. She is currently well controlled on Keppra 1000mg  twice daily. She denies any side effects to the drug any irritability Patient is not operating a motor vehicle and has never learned to drive. Patient is somewhat tremulous intermittently. 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 She has no new neurologic complaints.    Observations/Objective: Alert, speech is clear, answers questions, fair knowledge of health condition, cognitive slowing  Assessment and Plan: 1.  Seizures 2.  Abnormality of gait 3.  Cognitive slowing  Overall she appears to be doing well.  Her mom is at work during the visit.  She denies any recent seizures, does not remember last seizure.  She will continue taking Keppra 1000 mg twice daily.  She reports she does not need a refill.  She is tolerating medication well.  She will continue follow-up with primary care yearly, who checks routine lab work.  She should let us know of any recurrent seizure activity.  She does not drive a car.  She will come see [place] in the office in about 6 to 9 months.  Follow Up Instructions: 6-9 months for revisit    I discussed the assessment and treatment plan with the patient. The patient was provided an opportunity to ask questions and all were answered. The patient agreed with the plan and demonstrated an understanding of the instructions.   The patient was advised to call back or seek an in-person evaluation if the symptoms worsen or if the condition fails to improve as anticipated.  I provided 25 minutes of non-face-to-face time during this encounter.  Otila Kluver, DNP  [facility] [address], [address] [place], [place] [address] [phone]

## 2018-10-27 NOTE — Telephone Encounter (Signed)
Patient was sent a email with information on how to setup her webex meeting.

## 2018-10-28 ENCOUNTER — Other Ambulatory Visit: Payer: Self-pay

## 2018-10-28 ENCOUNTER — Ambulatory Visit (INDEPENDENT_AMBULATORY_CARE_PROVIDER_SITE_OTHER): Payer: Medicaid Other | Admitting: Neurology

## 2018-10-28 ENCOUNTER — Encounter: Payer: Self-pay | Admitting: Neurology

## 2018-10-28 DIAGNOSIS — G40209 Localization-related (focal) (partial) symptomatic epilepsy and epileptic syndromes with complex partial seizures, not intractable, without status epilepticus: Secondary | ICD-10-CM | POA: Diagnosis not present

## 2018-10-28 DIAGNOSIS — Z8659 Personal history of other mental and behavioral disorders: Secondary | ICD-10-CM

## 2018-10-28 DIAGNOSIS — R269 Unspecified abnormalities of gait and mobility: Secondary | ICD-10-CM | POA: Diagnosis not present

## 2018-10-28 NOTE — Progress Notes (Signed)
I have read the note, and I agree with the clinical assessment and plan.  Boneta Standre K Katina Remick   

## 2019-01-31 ENCOUNTER — Other Ambulatory Visit: Payer: Self-pay

## 2019-01-31 DIAGNOSIS — Z20822 Contact with and (suspected) exposure to covid-19: Secondary | ICD-10-CM

## 2019-02-04 LAB — NOVEL CORONAVIRUS, NAA: SARS-CoV-2, NAA: NOT DETECTED

## 2019-04-28 NOTE — Progress Notes (Deleted)
PATIENT: Shannon Travis DOB: 20-Jul-1985  REASON FOR VISIT: follow up HISTORY FROM: patient  HISTORY OF PRESENT ILLNESS: Today 04/28/19  HISTORY 10/28/2018 SS: Ms. Quevedo is a 33 year old female with a history of seizures, taking Keppra 1000 mg twice daily. She has history of gait disorder and cognitive slowing. Her seizures are under good control on current dose of Keppra. She does not operate a motor vehicle.  She has not had any recurrent seizures, does not remember when her last seizure was.  She currently lives at home with her mom, she has no work-related school.  She reports Monday she will watch TV, clean the house.  She denies any changes to her medications or medical history.  She is tolerating Keppra well, denies any episodes of irritability.  She reports a tremor occasionally in both hands when doing activity.  This does not affect her daily life.  She reports her gait has been doing well, she denies any falls.  She denies any problems or concerns. In the past she has had abnormal EEG suggesting complex partial seizures.    REVIEW OF SYSTEMS: Out of a complete 14 system review of symptoms, the patient complains only of the following symptoms, and all other reviewed systems are negative.  ALLERGIES: No Known Allergies  HOME MEDICATIONS: Outpatient Medications Prior to Visit  Medication Sig Dispense Refill  . Cholecalciferol (VITAMIN D3) 400 UNITS CAPS Take 1 tablet by mouth daily.    Marland Kitchen levETIRAcetam (KEPPRA) 1000 MG tablet Take 1 tablet (1,000 mg total) by mouth 2 (two) times daily. 180 tablet 3  . MedroxyPROGESTERone Acetate (DEPO-PROVERA IM) Inject into the muscle every 3 (three) months.     No facility-administered medications prior to visit.     PAST MEDICAL HISTORY: Past Medical History:  Diagnosis Date  . Abnormality of gait   . Movement disorder   . Seizures (HCC)     PAST SURGICAL HISTORY: Past Surgical History:  Procedure Laterality Date  . CYSTECTOMY  Left    leg    FAMILY HISTORY: Family History  Problem Relation Age of Onset  . Healthy Mother     SOCIAL HISTORY: Social History   Socioeconomic History  . Marital status: Single    Spouse name: Not on file  . Number of children: 0  . Years of education: 36  . Highest education level: Not on file  Occupational History  . Not on file  Social Needs  . Financial resource strain: Not on file  . Food insecurity    Worry: Not on file    Inability: Not on file  . Transportation needs    Medical: Not on file    Non-medical: Not on file  Tobacco Use  . Smoking status: Never Smoker  . Smokeless tobacco: Never Used  Substance and Sexual Activity  . Alcohol use: No  . Drug use: No  . Sexual activity: Not on file  Lifestyle  . Physical activity    Days per week: Not on file    Minutes per session: Not on file  . Stress: Not on file  Relationships  . Social Musician on phone: Not on file    Gets together: Not on file    Attends religious service: Not on file    Active member of club or organization: Not on file    Attends meetings of clubs or organizations: Not on file    Relationship status: Not on file  . Intimate partner  violence    Fear of current or ex partner: Not on file    Emotionally abused: Not on file    Physically abused: Not on file    Forced sexual activity: Not on file  Other Topics Concern  . Not on file  Social History Narrative   Patient lives at home with her mother Nicolette Bang.    Patient is currently working part time.    Patient is single.    Patient has no children.    Patient has high school education.       PHYSICAL EXAM  There were no vitals filed for this visit. There is no height or weight on file to calculate BMI.  Generalized: Well developed, in no acute distress   Neurological examination  Mentation: Alert oriented to time, place, history taking. Follows all commands speech and language fluent Cranial nerve  II-XII: Pupils were equal round reactive to light. Extraocular movements were full, visual field were full on confrontational test. Facial sensation and strength were normal. Uvula tongue midline. Head turning and shoulder shrug  were normal and symmetric. Motor: The motor testing reveals 5 over 5 strength of all 4 extremities. Good symmetric motor tone is noted throughout.  Sensory: Sensory testing is intact to soft touch on all 4 extremities. No evidence of extinction is noted.  Coordination: Cerebellar testing reveals good finger-nose-finger and heel-to-shin bilaterally.  Gait and station: Gait is normal. Tandem gait is normal. Romberg is negative. No drift is seen.  Reflexes: Deep tendon reflexes are symmetric and normal bilaterally.   DIAGNOSTIC DATA (LABS, IMAGING, TESTING) - I reviewed patient records, labs, notes, testing and imaging myself where available.  No results found for: WBC, HGB, HCT, MCV, PLT No results found for: NA, K, CL, CO2, GLUCOSE, BUN, CREATININE, CALCIUM, PROT, ALBUMIN, AST, ALT, ALKPHOS, BILITOT, GFRNONAA, GFRAA No results found for: CHOL, HDL, LDLCALC, LDLDIRECT, TRIG, CHOLHDL No results found for: HGBA1C No results found for: VITAMINB12 No results found for: TSH    ASSESSMENT AND PLAN 33 y.o. year old female  has a past medical history of Abnormality of gait, Movement disorder, and Seizures ([place]). here with ***   I spent 15 minutes with the patient. 50% of this time was spent   Butler Denmark, [place], DNP 04/28/2019, 4:29 PM [facility] [address] [place], [place] [address] [phone]

## 2019-04-29 ENCOUNTER — Telehealth: Payer: Self-pay | Admitting: Neurology

## 2019-04-29 ENCOUNTER — Encounter: Payer: Self-pay | Admitting: Neurology

## 2019-04-29 ENCOUNTER — Ambulatory Visit: Payer: Medicaid Other | Admitting: Neurology

## 2019-04-29 NOTE — Telephone Encounter (Signed)
Pt mother(on DPR)has called to inform that pt's legs locked up on her and she fell.  This is the 1st fall in a very long time.  Pt mother is asking for a call to discuss

## 2019-04-29 NOTE — Telephone Encounter (Signed)
I called the patient.  She reports this morning she was coming inside and her legs locked up, she fell on her foot.  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  She indicates her foot is swollen and she cannot bear weight.  I have suggested she apply ice, rest, and elevation.  She may need to seek evaluation by her primary care doctor or urgent care.  Please reschedule her appointment that she was not able to attend today.  She has not had recurrent seizure.

## 2019-04-29 NOTE — Telephone Encounter (Signed)
Spoke with the patient's mother and she stated that the patient's legs have been locking up a lot lately, she would be walking her daughter and next thing she knows her daughter would be standing there saying her legs aren't acting right. She mentioned that there are times were the patient almost most fell but didn't. She stated that fall happened yesterday when the patient was walking into the house. Please advise.

## 2019-04-30 NOTE — Telephone Encounter (Signed)
Spoke with the patient's mother and was able to schedule her an appt for 05-04-2019 at 10:45 am. Patient's mother is aware of appt date and time.

## 2019-05-03 NOTE — Progress Notes (Signed)
PATIENT: Shannon Travis DOB: Jun 24, 1986  REASON FOR VISIT: follow up HISTORY FROM: patient  HISTORY OF PRESENT ILLNESS: Today 05/04/19  Shannon Travis is a 33 year old female with history of seizures, gait disorder, and cognitive slowing.  Her mother indicates she has not had a seizure in many years.  Her last seizure was characterized by staring off.  She remains on Keppra 1000 mg twice daily and is tolerating medication without side effect.  She continues to live with her mother.  She is not very active.  She says during the day she likes to watch TV. She is not in any kind of work or school program. She also has underlying tremor to both hands that is worsened when she gets nervous.  She reports with her gait disorder, her legs will lock up.  This often happens if she is crossing the street or walking in a public place.  Her mother thinks this is likely related to anxiety and part of her cognitive slowing.  Last week, she had a fall coming inside at her home where her legs locked up and she landed on her right foot.  The swelling to her right foot has subsided, but it is still painful to bear weight.  She has had EEG in the past that has been suggestive of complex partial seizures.  Prior MRI of the brain has shown punctate white matter changes that are chronic in nature.  She presents today for follow-up accompanied by her mother.  HISTORY  10/28/2018 SS: Shannon Travis is a 33 year old female with a history of seizures, taking Keppra 1000 mg twice daily. She has history of gait disorder and cognitive slowing. Her seizures are under good control on current dose of Keppra. She does not operate a motor vehicle.  She has not had any recurrent seizures, does not remember when her last seizure was.  She currently lives at home with her mom, she has no work-related school.  She reports Monday she will watch TV, clean the house.  She denies any changes to her medications or medical history.  She is tolerating  Keppra well, denies any episodes of irritability.  She reports a tremor occasionally in both hands when doing activity.  This does not affect her daily life.  She reports her gait has been doing well, she denies any falls.  She denies any problems or concerns. In the past she has had abnormal EEG suggesting complex partial seizures.   10/22/2017 CM: Shannon Travis, 33year old female returns for yearly followup. She has a history of a seizure disorder but has not had seizures in 5years. She is currently well controlled on Keppra 1000mg  twice daily. She denies any side effects to the drug any irritability Patient is not operating a motor vehicle and has never learned to drive. Patient is somewhat tremulous intermittently. 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 She has no new neurologic complaints  REVIEW OF SYSTEMS: Out of a complete 14 system review of symptoms, the patient complains only of the following symptoms, and all other reviewed systems are negative.  Fall, seizures, gait abnormality  ALLERGIES: No Known Allergies  HOME MEDICATIONS: Outpatient Medications Prior to Visit  Medication Sig Dispense Refill  . Cholecalciferol (VITAMIN D3) 400 UNITS CAPS Take 1 tablet by  mouth daily.    Marland Kitchen levETIRAcetam (KEPPRA) 1000 MG tablet Take 1 tablet (1,000 mg total) by mouth 2 (two) times daily. 180 tablet 3  . MedroxyPROGESTERone Acetate (DEPO-PROVERA IM) Inject into the muscle every 3 (three) months.    Marland Kitchen VITAMIN D, CHOLECALCIFEROL, PO Take by mouth.     No facility-administered medications prior to visit.     PAST MEDICAL HISTORY: Past Medical History:  Diagnosis Date  . Abnormality of gait   . Movement disorder   . Seizures ([place])     PAST SURGICAL HISTORY: Past Surgical  History:  Procedure Laterality Date  . CYSTECTOMY Left    leg    FAMILY HISTORY: Family History  Problem Relation Age of Onset  . Healthy Mother     SOCIAL HISTORY: Social History   Socioeconomic History  . Marital status: Single    Spouse name: Not on file  . Number of children: 0  . Years of education: 79  . Highest education level: Not on file  Occupational History  . Not on file  Social Needs  . Financial resource strain: Not on file  . Food insecurity    Worry: Not on file    Inability: Not on file  . Transportation needs    Medical: Not on file    Non-medical: Not on file  Tobacco Use  . Smoking status: Never Smoker  . Smokeless tobacco: Never Used  Substance and Sexual Activity  . Alcohol use: No  . Drug use: No  . Sexual activity: Not on file  Lifestyle  . Physical activity    Days per week: Not on file    Minutes per session: Not on file  . Stress: Not on file  Relationships  . Social Herbalist on phone: Not on file    Gets together: Not on file    Attends religious service: Not on file    Active member of club or organization: Not on file    Attends meetings of clubs or organizations: Not on file    Relationship status: Not on file  . Intimate partner violence    Fear of current or ex partner: Not on file    Emotionally abused: Not on file    Physically abused: Not on file    Forced sexual activity: Not on file  Other Topics Concern  . Not on file  Social History Narrative   Patient lives at home with her mother Shannon Travis.    Patient is currently working part time.    Patient is single.    Patient has no children.    Patient has high school education.     PHYSICAL EXAM  Vitals:   05/04/19 1053  BP: (!) 178/89  Pulse: (!) 131  Temp: 97.8 F (36.6 C)  Weight: 297 lb 6.4 oz (134.9 kg)  Height: 5\' 7"  (1.702 m)   Body mass index is 46.58 kg/m.  Generalized: Well developed, in no acute distress, manual BP check was  130/70, HR 90  Neurological examination  Mentation: Alert oriented to time, place, history taking. Follows all commands speech and language fluent Cranial nerve II-XII: Pupils were equal round reactive to light. Extraocular movements were full, visual field were full on confrontational test. Facial sensation and strength were normal.  Head turning and shoulder shrug  were normal and symmetric. Motor: The motor testing reveals 5 over 5 strength of all 4 extremities. Good symmetric motor tone is noted throughout.  Sensory: Sensory  testing is intact to soft touch on all 4 extremities. No evidence of extinction is noted.  Coordination: Cerebellar testing reveals good finger-nose-finger and heel-to-shin bilaterally.  Intention tremor to bilateral hands noted Gait and station: Gait is antalgic on the right, slow to rise from seated position.  Reflexes: Deep tendon reflexes are symmetric and normal bilaterally.   DIAGNOSTIC DATA (LABS, IMAGING, TESTING) - I reviewed patient records, labs, notes, testing and imaging myself where available.  No results found for: WBC, HGB, HCT, MCV, PLT No results found for: NA, K, CL, CO2, GLUCOSE, BUN, CREATININE, CALCIUM, PROT, ALBUMIN, AST, ALT, ALKPHOS, BILITOT, GFRNONAA, GFRAA No results found for: CHOL, HDL, LDLCALC, LDLDIRECT, TRIG, CHOLHDL No results found for: [id number] No results found for: VITAMINB12 No results found for: TSH   ASSESSMENT AND PLAN 33 y.o. year old female  has a past medical history of Abnormality of gait, Movement disorder, and Seizures (HCC). here with:  1.  Seizure 2.  Gait abnormality  She has not had recurrent seizure in many years.  She will continue taking Keppra 1000 mg twice a day.  She had a fall last week that occurred while she was walking inside, her legs locked up, and she fell landing on her right foot.  She continues to have trouble bearing weight, along with bruising.  I suggested she see her primary doctor for further  evaluation.  She has a chronic gait disorder.  She says over time her gait has worsened, her legs frequently lock up, especially in public places, likely due to her cognitive slowing issues.  She has not been sent for physical therapy.  When she recovers from her foot injury, we may consider a referral to physical therapy to determine if any techniques may be helpful to promote gait stability.  She will follow-up in 6 months or sooner if needed.  I did advise her symptoms worsen or she develops any new symptoms she should let us know.  I spent 15 minutes with the patient. 50% of this time was spent discussing her plan of care.   Margie EgeSarah Doni Bacha, AGNP-C, DNP 05/04/2019, 11:56 AM [facility] [address], [address] [place], [place] [id number] [phone]

## 2019-05-04 ENCOUNTER — Ambulatory Visit: Payer: Medicaid Other | Admitting: Neurology

## 2019-05-04 ENCOUNTER — Other Ambulatory Visit: Payer: Self-pay

## 2019-05-04 ENCOUNTER — Encounter: Payer: Self-pay | Admitting: Neurology

## 2019-05-04 VITALS — BP 178/89 | HR 131 | Temp 97.8°F | Ht 67.0 in | Wt 297.4 lb

## 2019-05-04 DIAGNOSIS — G40209 Localization-related (focal) (partial) symptomatic epilepsy and epileptic syndromes with complex partial seizures, not intractable, without status epilepticus: Secondary | ICD-10-CM

## 2019-05-04 DIAGNOSIS — R269 Unspecified abnormalities of gait and mobility: Secondary | ICD-10-CM

## 2019-05-04 NOTE — Progress Notes (Signed)
I have read the note, and I agree with the clinical assessment and plan.  Jenille Laszlo K Yolette Hastings   

## 2019-05-04 NOTE — Patient Instructions (Signed)
1. Continue Keppra  2. Follow-up with PCP about right foot swelling  3. We can discuss PT in the future

## 2019-11-02 NOTE — Progress Notes (Signed)
PATIENT: Shannon Travis DOB: [birth year]  REASON FOR VISIT: follow up HISTORY FROM: patient  HISTORY OF PRESENT ILLNESS: Today 11/03/19  Shannon Travis 34 year old female with history of seizures, gait disorder, and cognitive slowing.  Her last seizure occurred many years ago, it was characterized as staring off.  She remains on Keppra.  She lives with her mother.  She continues to have difficulty with her balance, will freeze up when walking into stores if she looks at other cars, like her legs lock up. Could be part related to her anxiety. She has intermittent tremors in both hands, doesn't affect eating, has chronically been this way.  She had a fall a few weeks ago when she was coming back from the mailbox, was not injured.  She is not in any kind of work or school program.  She is not very active.  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  She presents today for follow-up accompanied by her mother.  HISTORY  05/04/2019 SS: Shannon Travis is a 34 year old female with history of seizures, gait disorder, and cognitive slowing.  Her mother indicates she has not had a seizure in many years.  Her last seizure was characterized by staring off.  She remains on Keppra 1000 mg twice daily and is tolerating medication without side effect.  She continues to live with her mother.  She is not very active.  She says during the day she likes to watch TV. She is not in any kind of work or school program. She also has underlying tremor to both hands that is worsened when she gets nervous.  She reports with her gait disorder, her legs will lock up.  This often happens if she is crossing the street or walking in a public place.  Her mother thinks this is likely related to anxiety and part of her cognitive slowing.  Last week, she had a fall coming inside at her home where her legs locked up and she landed on her right foot.  The swelling to her right foot has subsided, but it is still painful to  bear weight.  She has had EEG in the past that has been suggestive of complex partial seizures.  Prior MRI of the brain has shown punctate white matter changes that are chronic in nature.  She presents today for follow-up accompanied by her mother.  REVIEW OF SYSTEMS: Out of a complete 14 system review of symptoms, the patient complains only of the following symptoms, and all other reviewed systems are negative.  Recent fall, seizure, walking difficulty  ALLERGIES: No Known Allergies  HOME MEDICATIONS: Outpatient Medications Prior to Visit  Medication Sig Dispense Refill  . Cholecalciferol (VITAMIN D3) 400 UNITS CAPS Take 1 tablet by mouth daily.    . MedroxyPROGESTERone Acetate (DEPO-PROVERA IM) Inject into the muscle every 3 (three) months.    Marland Kitchen VITAMIN D, CHOLECALCIFEROL, PO Take by mouth.    . levETIRAcetam (KEPPRA) 1000 MG tablet Take 1 tablet (1,000 mg total) by mouth 2 (two) times daily. 180 tablet 3   No facility-administered medications prior to visit.    PAST MEDICAL HISTORY: Past Medical History:  Diagnosis Date  . Abnormality of gait   . Movement disorder   . Seizures ([place])     PAST SURGICAL HISTORY: Past Surgical History:  Procedure Laterality Date  . CYSTECTOMY Left    leg    FAMILY HISTORY: Family History  Problem Relation Age of Onset  . Healthy  Mother     SOCIAL HISTORY: Social History   Socioeconomic History  . Marital status: Single    Spouse name: Not on file  . Number of children: 0  . Years of education: 24  . Highest education level: Not on file  Occupational History  . Not on file  Tobacco Use  . Smoking status: Never Smoker  . Smokeless tobacco: Never Used  Substance and Sexual Activity  . Alcohol use: No  . Drug use: No  . Sexual activity: Not on file  Other Topics Concern  . Not on file  Social History Narrative   Patient lives at home with her mother Hollie Beach.    Patient is currently working part time.    Patient is  single.    Patient has no children.    Patient has high school education.    Social Determinants of Health   Financial Resource Strain:   . Difficulty of Paying Living Expenses:   Food Insecurity:   . Worried About Programme researcher, broadcasting/film/video in the Last Year:   . Barista in the Last Year:   Transportation Needs:   . Freight forwarder (Medical):   Marland Kitchen Lack of Transportation (Non-Medical):   Physical Activity:   . Days of Exercise per Week:   . Minutes of Exercise per Session:   Stress:   . Feeling of Stress :   Social Connections:   . Frequency of Communication with Friends and Family:   . Frequency of Social Gatherings with Friends and Family:   . Attends Religious Services:   . Active Member of Clubs or Organizations:   . Attends Banker Meetings:   Marland Kitchen Marital Status:   Intimate Partner Violence:   . Fear of Current or Ex-Partner:   . Emotionally Abused:   Marland Kitchen Physically Abused:   . Sexually Abused:    PHYSICAL EXAM  Vitals:   11/03/19 0801  BP: 134/84  Pulse: 100  Temp: (!) 97.5 F (36.4 C)  Weight: 291 lb (132 kg)  Height: 5\' 7"  (1.702 m)   Body mass index is 45.58 kg/m.  Generalized: Well developed, in no acute distress   Neurological examination  Mentation: Alert oriented to time, place, history is equally provided by the patient and her mother. Follows all commands speech and language fluent Cranial nerve II-XII: Pupils were equal round reactive to light. Extraocular movements were full, visual field were full on confrontational test. Facial sensation and strength were normal. Head turning and shoulder shrug were normal and symmetric. Motor: The motor testing reveals 5 over 5 strength of all 4 extremities. Good symmetric motor tone is noted throughout.  Postural tremor noted bilaterally in upper extremities. Sensory: Sensory testing is intact to soft touch on all 4 extremities. No evidence of extinction is noted.  Coordination: Cerebellar  testing reveals good finger-nose-finger and heel-to-shin bilaterally.  Mild intention tremor noted with finger-nose-finger bilaterally Gait and station: Has to push off from seated position, slow to rise, movements of positioning are slowed, gait is wide-based, cautious Reflexes: Deep tendon reflexes are symmetric and normal bilaterally.   DIAGNOSTIC DATA (LABS, IMAGING, TESTING) - I reviewed patient records, labs, notes, testing and imaging myself where available.  No results found for: WBC, HGB, HCT, MCV, PLT No results found for: NA, K, CL, CO2, GLUCOSE, BUN, CREATININE, CALCIUM, PROT, ALBUMIN, AST, ALT, ALKPHOS, BILITOT, GFRNONAA, GFRAA No results found for: CHOL, HDL, LDLCALC, LDLDIRECT, TRIG, CHOLHDL No results found for:  No results found for: VITAMINB12 No results found for: TSH    ASSESSMENT AND PLAN 34 y.o. year old female  has a past medical history of Abnormality of gait, Movement disorder, and Seizures (HCC). here with:  1.  Seizure 2.  Gait abnormality  She has not had recurrent seizure in many years.  She will remain on Keppra 1000 mg twice a day.  I will refer her for physical therapy for gait and balance training.  She will follow-up in 6 months or sooner if needed.  I spent 20 minutes of face-to-face and non-face-to-face time with patient.  This included previsit chart review, lab review, study review, order entry, electronic health record documentation, patient education.  Margie Ege, AGNP-C, DNP 11/03/2019, 8:38 AM [facility] [address] [place], [place] [address] [phone]

## 2019-11-03 ENCOUNTER — Encounter: Payer: Self-pay | Admitting: Neurology

## 2019-11-03 ENCOUNTER — Other Ambulatory Visit: Payer: Self-pay

## 2019-11-03 ENCOUNTER — Ambulatory Visit: Payer: Medicaid Other | Admitting: Neurology

## 2019-11-03 VITALS — BP 134/84 | HR 100 | Temp 97.5°F | Ht 67.0 in | Wt 291.0 lb

## 2019-11-03 DIAGNOSIS — G40209 Localization-related (focal) (partial) symptomatic epilepsy and epileptic syndromes with complex partial seizures, not intractable, without status epilepticus: Secondary | ICD-10-CM

## 2019-11-03 DIAGNOSIS — R269 Unspecified abnormalities of gait and mobility: Secondary | ICD-10-CM | POA: Diagnosis not present

## 2019-11-03 MED ORDER — LEVETIRACETAM 1000 MG PO TABS: 1000.0000 mg | ORAL_TABLET | Freq: Two times a day (BID) | ORAL | 3 refills | Status: DC

## 2019-11-03 NOTE — Patient Instructions (Signed)
It was nice to see you today! I'll send you for physical therapy for gait and balance training  See you back 6 months, or sooner if needed, call for seizure

## 2019-11-04 NOTE — Progress Notes (Signed)
I have read the note, and I agree with the clinical assessment and plan.  Nicci Vaughan K Jorryn Casagrande   

## 2019-11-16 DIAGNOSIS — M51369 Other intervertebral disc degeneration, lumbar region without mention of lumbar back pain or lower extremity pain: Secondary | ICD-10-CM | POA: Insufficient documentation

## 2019-11-16 DIAGNOSIS — M25551 Pain in right hip: Secondary | ICD-10-CM | POA: Insufficient documentation

## 2019-11-16 DIAGNOSIS — M5136 Other intervertebral disc degeneration, lumbar region: Secondary | ICD-10-CM | POA: Insufficient documentation

## 2019-11-24 DIAGNOSIS — M8589 Other specified disorders of bone density and structure, multiple sites: Secondary | ICD-10-CM | POA: Insufficient documentation

## 2019-11-25 DIAGNOSIS — Z8249 Family history of ischemic heart disease and other diseases of the circulatory system: Secondary | ICD-10-CM | POA: Insufficient documentation

## 2019-12-17 DIAGNOSIS — E782 Mixed hyperlipidemia: Secondary | ICD-10-CM | POA: Insufficient documentation

## 2019-12-17 DIAGNOSIS — Z3041 Encounter for surveillance of contraceptive pills: Secondary | ICD-10-CM | POA: Insufficient documentation

## 2020-02-02 ENCOUNTER — Encounter: Payer: Self-pay | Admitting: Neurology

## 2020-02-02 ENCOUNTER — Ambulatory Visit: Payer: Medicaid Other | Admitting: Neurology

## 2020-02-02 VITALS — BP 110/70 | Ht 67.0 in | Wt 292.0 lb

## 2020-02-02 DIAGNOSIS — G40209 Localization-related (focal) (partial) symptomatic epilepsy and epileptic syndromes with complex partial seizures, not intractable, without status epilepticus: Secondary | ICD-10-CM | POA: Diagnosis not present

## 2020-02-02 DIAGNOSIS — R269 Unspecified abnormalities of gait and mobility: Secondary | ICD-10-CM | POA: Diagnosis not present

## 2020-02-02 NOTE — Progress Notes (Signed)
I have read the note, and I agree with the clinical assessment and plan.  Mychal Durio K Elpidio Thielen   

## 2020-02-02 NOTE — Patient Instructions (Addendum)
Continue Keppra at current dosing Check keppra level today  Adjust dosing if necessary  Keep next appointment

## 2020-02-02 NOTE — Progress Notes (Signed)
PATIENT: Shannon Travis DOB: 1986-04-30  REASON FOR VISIT: follow up HISTORY FROM: patient  HISTORY OF PRESENT ILLNESS: Today 02/02/20  Shannon Travis is a 34 year old female with history of seizures, gait disorder, and cognitive slowing.  Her previous seizures have been characterized as staring off.  She is on Keppra, lives with her mother.  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  Reports on Saturday, was lying in the bed getting ready to go to sleep, says that she had possible seizure, described as her arms and legs shaking uncontrollably, she was aware the entire time, her mom heard her yelling, found her in the bed, alert but her arms and legs were shaking.  She was able to calm her down, and the shaking subsided.  There was no loss of consciousness, oral injury, or urinary incontinence.  She has been compliant with Keppra, no recent illness, nothing out of the ordinary for that day.  Her mother has not witnessed this type of benefit.  She is tremulous at baseline to her head, arms, and legs.  Has been taken off Depo due to low calcium levels, is now on oral birth control.  She has been on a stable dose of Keppra for several years.  She is in physical therapy, has been helpful for her gait.  Presents today for follow-up accompanied by her mother.  HISTORY 11/03/2019 SS: Shannon Travis 34 year old female with history of seizures, gait disorder, and cognitive slowing.  Her last seizure occurred many years ago, it was characterized as staring off.  She remains on Keppra.  She lives with her mother.  She continues to have difficulty with her balance, will freeze up when walking into stores if she looks at other cars, like her legs lock up. Could be part related to her anxiety. She has intermittent tremors in both hands, doesn't affect eating, has chronically been this way.  She had a fall a few weeks ago when she was coming back from the mailbox, was not injured.  She is not in  any kind of work or school program.  She is not very active.  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  She presents today for follow-up accompanied by her mother.   REVIEW OF SYSTEMS: Out of a complete 14 system review of symptoms, the patient complains only of the following symptoms, and all other reviewed systems are negative.  Seizure  ALLERGIES: No Known Allergies  HOME MEDICATIONS: Outpatient Medications Prior to Visit  Medication Sig Dispense Refill  . calcium carbonate (OS-CAL - DOSED IN MG OF ELEMENTAL CALCIUM) 1250 (500 Ca) MG tablet Take by mouth.    . Cholecalciferol (VITAMIN D3) 400 UNITS CAPS Take 1 tablet by mouth daily.    Marland Kitchen levETIRAcetam (KEPPRA) 1000 MG tablet Take 1 tablet (1,000 mg total) by mouth 2 (two) times daily. 180 tablet 3  . meloxicam (MOBIC) 15 MG tablet Take 15 mg by mouth as needed.    . norethindrone (MICRONOR) 0.35 MG tablet Take by mouth.    Marland Kitchen VITAMIN D, CHOLECALCIFEROL, PO Take by mouth.    . MedroxyPROGESTERone Acetate (DEPO-PROVERA IM) Inject into the muscle every 3 (three) months.     No facility-administered medications prior to visit.    PAST MEDICAL HISTORY: Past Medical History:  Diagnosis Date  . Abnormality of gait   . Movement disorder   . Seizures (HCC)     PAST SURGICAL HISTORY: Past Surgical History:  Procedure Laterality Date  . CYSTECTOMY Left    leg    FAMILY HISTORY: Family History  Problem Relation Age of Onset  . Healthy Mother     SOCIAL HISTORY: Social History   Socioeconomic History  . Marital status: Single    Spouse name: Not on file  . Number of children: 0  . Years of education: 30  . Highest education level: Not on file  Occupational History  . Not on file  Tobacco Use  . Smoking status: Never Smoker  . Smokeless tobacco: Never Used  Substance and Sexual Activity  . Alcohol use: No  . Drug use: No  . Sexual activity: Not on file  Other Topics Concern  . Not on file    Social History Narrative   Patient lives at home with her mother Hollie Beach.    Patient is currently working part time.    Patient is single.    Patient has no children.    Patient has high school education.    Social Determinants of Health   Financial Resource Strain:   . Difficulty of Paying Living Expenses:   Food Insecurity:   . Worried About Programme researcher, broadcasting/film/video in the Last Year:   . Barista in the Last Year:   Transportation Needs:   . Freight forwarder (Medical):   Marland Kitchen Lack of Transportation (Non-Medical):   Physical Activity:   . Days of Exercise per Week:   . Minutes of Exercise per Session:   Stress:   . Feeling of Stress :   Social Connections:   . Frequency of Communication with Friends and Family:   . Frequency of Social Gatherings with Friends and Family:   . Attends Religious Services:   . Active Member of Clubs or Organizations:   . Attends Banker Meetings:   Marland Kitchen Marital Status:   Intimate Partner Violence:   . Fear of Current or Ex-Partner:   . Emotionally Abused:   Marland Kitchen Physically Abused:   . Sexually Abused:    PHYSICAL EXAM  Vitals:   02/02/20 1016  Weight: 292 lb (132.5 kg)  Height: 5\' 7"  (1.702 m)   Body mass index is 45.73 kg/m.  Generalized: Well developed, in no acute distress   Neurological examination  Mentation: Alert oriented to time, place, history taking. Follows all commands speech and language fluent Cranial nerve II-XII: Pupils were equal round reactive to light. Extraocular movements were full, visual field were full on confrontational test. Facial sensation and strength were normal. Head turning and shoulder shrug  were normal and symmetric.  Head tremor noted Motor: The motor testing reveals 5 over 5 strength of all 4 extremities. Good symmetric motor tone is noted throughout.  Postural tremor to both hands mild to moderate. Sensory: Sensory testing is intact to soft touch on all 4 extremities. No  evidence of extinction is noted.  Coordination: Cerebellar testing reveals good finger-nose-finger and heel-to-shin bilaterally.  Tremor noted with finger-nose-finger mild to moderate. Gait and station: Gait is normal, somewhat wide-based. Reflexes: Deep tendon reflexes are symmetric and normal  DIAGNOSTIC DATA (LABS, IMAGING, TESTING) - I reviewed patient records, labs, notes, testing and imaging myself where available.  No results found for: WBC, HGB, HCT, MCV, PLT No results found for: NA, K, CL, CO2, GLUCOSE, BUN, CREATININE, CALCIUM, PROT, ALBUMIN, AST, ALT, ALKPHOS, BILITOT, GFRNONAA, GFRAA No results found for: CHOL, HDL, LDLCALC, LDLDIRECT, TRIG, CHOLHDL No results found for: No results found  for: VITAMINB12 No results found for: TSH  ASSESSMENT AND PLAN 34 y.o. year old female  has a past medical history of Abnormality of gait, Movement disorder, and Seizures (HCC). here with:  1.  Seizure 2.  Gait abnormality  Historically, her seizures have been quite well controlled, described as staring episodes.  She describes a recent incident of shaking in her arms and legs, with awareness during the entire time, subsided with her mother calming her down.  This does not sound like true seizure event, has been compliant with Keppra.  I will check Keppra level today.  EEG has previously been abnormal suggesting partial complex seizures.  If Keppra level is therapeutic, will continue to monitor.  She will keep her regular follow-up appointment in October.  I spent 30 minutes of face-to-face and non-face-to-face time with patient.  This included previsit chart review, lab review, study review, order entry, electronic health record documentation, patient education.  Margie Ege, AGNP-C, DNP 02/02/2020, 10:43 AM [facility] [address] [place], [place] [address] [phone]

## 2020-02-04 LAB — LEVETIRACETAM LEVEL: Levetiracetam Lvl: 29.8 ug/mL (ref 10.0–40.0)

## 2020-04-10 ENCOUNTER — Other Ambulatory Visit: Payer: Self-pay

## 2020-04-10 ENCOUNTER — Ambulatory Visit (HOSPITAL_COMMUNITY)
Admission: EM | Admit: 2020-04-10 | Discharge: 2020-04-10 | Disposition: A | Discharge status: Home / Self Care | Payer: Medicaid Other | Attending: Family Medicine | Admitting: Family Medicine

## 2020-04-10 ENCOUNTER — Encounter (HOSPITAL_COMMUNITY): Payer: Self-pay | Admitting: *Deleted

## 2020-04-10 DIAGNOSIS — R519 Headache, unspecified: Secondary | ICD-10-CM | POA: Insufficient documentation

## 2020-04-10 DIAGNOSIS — Z79899 Other long term (current) drug therapy: Secondary | ICD-10-CM | POA: Diagnosis not present

## 2020-04-10 DIAGNOSIS — Z791 Long term (current) use of non-steroidal anti-inflammatories (NSAID): Secondary | ICD-10-CM | POA: Diagnosis not present

## 2020-04-10 DIAGNOSIS — E282 Polycystic ovarian syndrome: Secondary | ICD-10-CM | POA: Insufficient documentation

## 2020-04-10 DIAGNOSIS — G40909 Epilepsy, unspecified, not intractable, without status epilepticus: Secondary | ICD-10-CM | POA: Diagnosis not present

## 2020-04-10 DIAGNOSIS — Z20822 Contact with and (suspected) exposure to covid-19: Secondary | ICD-10-CM | POA: Insufficient documentation

## 2020-04-10 MED ORDER — KETOROLAC TROMETHAMINE 30 MG/ML IJ SOLN
30.0000 mg | Freq: Once | INTRAMUSCULAR | Status: AC
Start: 2020-04-10 — End: 2020-04-10
  Administered 2020-04-10: 30 mg via INTRAMUSCULAR

## 2020-04-10 MED ORDER — KETOROLAC TROMETHAMINE 30 MG/ML IJ SOLN
INTRAMUSCULAR | Status: AC
  Filled 2020-04-10: qty 1

## 2020-04-10 NOTE — ED Notes (Signed)
Mother called to room per request of patient

## 2020-04-10 NOTE — ED Triage Notes (Signed)
PT reports she was in [place] a a sharp ,sever HA started . Pt denies any vision changes , no N/V , Pt  Reports Hx of seizures and took meds this AM. Pt reports forehead hurts LT to RT side . Pt observed hands shaking . Gaylyn Rong is 8/10 at this time.

## 2020-04-10 NOTE — ED Provider Notes (Signed)
[facility]    CSN: [id number] Arrival date & time: 04/10/20  1135      History   Chief Complaint Chief Complaint  Patient presents with  . Headache    HPI Shannon Travis is a 34 y.o. female.  Patient complains of frontal headache that occurred suddenly while she was in church today. She denies any visual symptoms or aura. She denies any weakness or numbness in her extremities. She does have a seizure disorder and is on anticonvulsant.  HPI  Past Medical History:  Diagnosis Date  . Abnormality of gait   . Movement disorder   . Seizures [facility])     Patient Active Problem List   Diagnosis Date Noted  . Partial epilepsy with impairment of consciousness (HCC) 12/03/2012  . Abnormality of gait 12/03/2012  . Essential and other specified forms of tremor 12/03/2012  . H/O mental disorder 05/15/2011  . Cerebral palsy (HCC) 05/02/2011  . Benign essential tremor 05/02/2011  . Bilateral polycystic ovarian syndrome 02/28/2011    Past Surgical History:  Procedure Laterality Date  . CYSTECTOMY Left    leg    OB History   No obstetric history on file.      Home Medications    Prior to Admission medications   Medication Sig Start Date End Date Taking? Authorizing Provider  calcium carbonate (OS-CAL - DOSED IN MG OF ELEMENTAL CALCIUM) 1250 (500 Ca) MG tablet Take by mouth. 12/18/19  Yes [provider]  Cholecalciferol (VITAMIN D3) 400 UNITS CAPS Take 1 tablet by mouth daily.   Yes [provider]  levETIRAcetam (KEPPRA) 1000 MG tablet Take 1 tablet (1,000 mg total) by mouth 2 (two) times daily. 11/03/19  Yes Glean Salvo, NP  norethindrone (MICRONOR) 0.35 MG tablet Take by mouth. 11/25/19  Yes [provider]  VITAMIN D, CHOLECALCIFEROL, PO Take by mouth.   Yes [provider]  meloxicam (MOBIC) 15 MG tablet Take 15 mg by mouth as needed. 12/17/19   [provider]    Family History Family History  Problem Relation  Age of Onset  . Healthy Mother     Social History Social History   Tobacco Use  . Smoking status: Never Smoker  . Smokeless tobacco: Never Used  Substance Use Topics  . Alcohol use: No  . Drug use: No     Allergies   Patient has no known allergies.   Review of Systems Review of Systems  Neurological: Positive for headaches.  All other systems reviewed and are negative.    Physical Exam Triage Vital Signs ED Triage Vitals  Enc Vitals Group     BP 04/10/20 1146 (!) 103/57     Pulse Rate 04/10/20 1146 88     Resp 04/10/20 1146 18     Temp 04/10/20 1146 98.8 F (37.1 C)     Temp Source 04/10/20 1146 Oral     SpO2 04/10/20 1146 99 %     Weight 04/10/20 1148 286 lb (129.7 kg)     Height 04/10/20 1148 5\' 7"  (1.702 m)     Head Circumference --      Peak Flow --      Pain Score 04/10/20 1147 8     Pain Loc --      Pain Edu? --      Excl. in GC? --    No data found.  Updated Vital Signs BP (!) 103/57 (BP Location: Left Arm)   Pulse 88  Temp 98.8 F (37.1 C) (Oral)   Resp 18   Ht 5\' 7"  (1.702 m)   Wt 129.7 kg   LMP 02/08/2020 Comment: PT started Birth control pill 2 months a go  and has not had a cycle yet.   SpO2 99%   BMI 44.79 kg/m   Visual Acuity Right Eye Distance:   Left Eye Distance:   Bilateral Distance:    Right Eye Near:   Left Eye Near:    Bilateral Near:     Physical Exam Vitals and nursing note reviewed.  Constitutional:      Appearance: She is well-developed. She is obese.  Eyes:     General: No visual field deficit.    Extraocular Movements: Extraocular movements intact.     Pupils: Pupils are equal, round, and reactive to light.  Cardiovascular:     Rate and Rhythm: Normal rate and regular rhythm.     Heart sounds: Normal heart sounds.  Pulmonary:     Effort: Pulmonary effort is normal.     Breath sounds: Normal breath sounds.  Musculoskeletal:     Cervical back: Normal range of motion.  Neurological:     Mental Status: She  is alert and oriented to person, place, and time.     Cranial Nerves: No cranial nerve deficit, dysarthria or facial asymmetry.     Sensory: No sensory deficit.     Motor: No weakness.     Coordination: Coordination abnormal.     Gait: Gait normal.     Deep Tendon Reflexes: Reflexes normal.  Psychiatric:        Mood and Affect: Mood is anxious.        Behavior: Behavior normal.      UC Treatments / Results  Labs (all labs ordered are listed, but only abnormal results are displayed) Labs Reviewed  SARS CORONAVIRUS 2 (TAT 6-24 HRS)    EKG   Radiology No results found.  Procedures Procedures (including critical care time)  Medications Ordered in UC Medications  ketorolac (TORADOL) 30 MG/ML injection 30 mg (has no administration in time range)    Initial Impression / Assessment and Plan / UC Course  I have reviewed the triage vital signs and the nursing notes.  Pertinent labs & imaging results that were available during my care of the patient were reviewed by me and considered in my medical decision making (see chart for details).     Headache of uncertain etiology. Clinically her there is no evidence of CVA. I discussed findings with patient and her mother. I think if symptoms persist she deserves a head scan. For now I will treat pain. Patient's mom also request Covid test although fully immunized Final Clinical Impressions(s) / UC Diagnoses   Final diagnoses:  None   Discharge Instructions   None    ED Prescriptions    None     PDMP not reviewed this encounter.   02/10/2020, MD 04/10/20 [phone]

## 2020-04-11 LAB — SARS CORONAVIRUS 2 (TAT 6-24 HRS): SARS Coronavirus 2: NEGATIVE

## 2020-05-04 ENCOUNTER — Ambulatory Visit: Payer: Medicaid Other | Admitting: Neurology

## 2020-05-04 ENCOUNTER — Encounter: Payer: Self-pay | Admitting: Neurology

## 2020-05-04 ENCOUNTER — Other Ambulatory Visit: Payer: Self-pay

## 2020-05-04 VITALS — BP 124/82 | HR 79 | Ht 67.0 in | Wt 289.6 lb

## 2020-05-04 DIAGNOSIS — G40209 Localization-related (focal) (partial) symptomatic epilepsy and epileptic syndromes with complex partial seizures, not intractable, without status epilepticus: Secondary | ICD-10-CM | POA: Diagnosis not present

## 2020-05-04 DIAGNOSIS — R269 Unspecified abnormalities of gait and mobility: Secondary | ICD-10-CM

## 2020-05-04 MED ORDER — LEVETIRACETAM 1000 MG PO TABS: 1000.0000 mg | ORAL_TABLET | Freq: Two times a day (BID) | ORAL | 3 refills | Status: DC

## 2020-05-04 NOTE — Progress Notes (Signed)
PATIENT: Shannon Travis DOB: 08-16-1985  REASON FOR VISIT: follow up HISTORY FROM: patient  HISTORY OF PRESENT ILLNESS: Today 05/04/20 Shannon Travis is a 34 year old female with history of seizures, gait disorder, and cognitive slowing.  Her seizures have been characterized as staring off.  She is on Keppra, lives with her mother.  On 9/26, went to urgent care complaining of left frontal headache occurred suddenly while at church, given Toradol shot, it went away.  No further headache.  No recent falls, still has fear of walking in crosswalks, sometimes freezes, but is doing better.  She is going to [employer] working on weight loss, considering getting a Psychologist, educational.  She is not very active, enjoys watching TV.  No recent seizure.  She manages Keppra, takes consistently every day.  Here today accompanied by her mother.  HISTORY 02/02/2020 SS: Shannon Travis is a 34 year old female with history of seizures, gait disorder, and cognitive slowing.  Her previous seizures have been characterized as staring off.  She is on Keppra, lives with her mother.  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  Reports on Saturday, was lying in the bed getting ready to go to sleep, says that she had possible seizure, described as her arms and legs shaking uncontrollably, she was aware the entire time, her mom heard her yelling, found her in the bed, alert but her arms and legs were shaking.  She was able to calm her down, and the shaking subsided.  There was no loss of consciousness, oral injury, or urinary incontinence.  She has been compliant with Keppra, no recent illness, nothing out of the ordinary for that day.  Her mother has not witnessed this type of benefit.  She is tremulous at baseline to her head, arms, and legs.  Has been taken off Depo due to low calcium levels, is now on oral birth control.  She has been on a stable dose of Keppra for several years.  She is in physical therapy, has been  helpful for her gait.  Presents today for follow-up accompanied by her mother.   REVIEW OF SYSTEMS: Out of a complete 14 system review of symptoms, the patient complains only of the following symptoms, and all other reviewed systems are negative.  N/A  ALLERGIES: No Known Allergies  HOME MEDICATIONS: Outpatient Medications Prior to Visit  Medication Sig Dispense Refill  . calcium carbonate (OS-CAL - DOSED IN MG OF ELEMENTAL CALCIUM) 1250 (500 Ca) MG tablet Take by mouth.    . Cholecalciferol (VITAMIN D3) 400 UNITS CAPS Take 1 tablet by mouth daily.    . meloxicam (MOBIC) 15 MG tablet Take 15 mg by mouth as needed.    . norethindrone (MICRONOR) 0.35 MG tablet Take by mouth.    Marland Kitchen VITAMIN D, CHOLECALCIFEROL, PO Take by mouth.    . levETIRAcetam (KEPPRA) 1000 MG tablet Take 1 tablet (1,000 mg total) by mouth 2 (two) times daily. 180 tablet 3   No facility-administered medications prior to visit.    PAST MEDICAL HISTORY: Past Medical History:  Diagnosis Date  . Abnormality of gait   . Movement disorder   . Seizures (HCC)     PAST SURGICAL HISTORY: Past Surgical History:  Procedure Laterality Date  . CYSTECTOMY Left    leg    FAMILY HISTORY: Family History  Problem Relation Age of Onset  . Healthy Mother     SOCIAL HISTORY: Social History   Socioeconomic History  . Marital  status: Single    Spouse name: Not on file  . Number of children: 0  . Years of education: 51  . Highest education level: Not on file  Occupational History  . Not on file  Tobacco Use  . Smoking status: Never Smoker  . Smokeless tobacco: Never Used  Substance and Sexual Activity  . Alcohol use: No  . Drug use: No  . Sexual activity: Not on file  Other Topics Concern  . Not on file  Social History Narrative   Patient lives at home with her mother Hollie Beach.    Patient is currently working part time.    Patient is single.    Patient has no children.    Patient has high school  education.    Social Determinants of Health   Financial Resource Strain:   . Difficulty of Paying Living Expenses: Not on file  Food Insecurity:   . Worried About Programme researcher, broadcasting/film/video in the Last Year: Not on file  . Ran Out of Food in the Last Year: Not on file  Transportation Needs:   . Lack of Transportation (Medical): Not on file  . Lack of Transportation (Non-Medical): Not on file  Physical Activity:   . Days of Exercise per Week: Not on file  . Minutes of Exercise per Session: Not on file  Stress:   . Feeling of Stress : Not on file  Social Connections:   . Frequency of Communication with Friends and Family: Not on file  . Frequency of Social Gatherings with Friends and Family: Not on file  . Attends Religious Services: Not on file  . Active Member of Clubs or Organizations: Not on file  . Attends Banker Meetings: Not on file  . Marital Status: Not on file  Intimate Partner Violence:   . Fear of Current or Ex-Partner: Not on file  . Emotionally Abused: Not on file  . Physically Abused: Not on file  . Sexually Abused: Not on file   PHYSICAL EXAM  Vitals:   05/04/20 0808  BP: 124/82  Pulse: 79  Weight: 289 lb 9.6 oz (131.4 kg)  Height: 5\' 7"  (1.702 m)   Body mass index is 45.36 kg/m.  Generalized: Well developed, in no acute distress  Neurological examination  Mentation: Alert oriented to time, place, history taking. Follows all commands speech and language fluent, cognitive slowing Cranial nerve II-XII: Pupils were equal round reactive to light. Extraocular movements were full, visual field were full on confrontational test. Facial sensation and strength were normal. Head turning and shoulder shrug were normal and symmetric. Motor: Good strength all extremities, has bilateral upper extremity postural tremor, mild Sensory: Sensory testing is intact to soft touch on all 4 extremities. No evidence of extinction is noted.  Coordination: Cerebellar testing  reveals good finger-nose-finger and heel-to-shin bilaterally.  Bilateral mild intention tremor Gait and station: Gait is wide-based, cautious Reflexes: Deep tendon reflexes are symmetric and normal bilaterally.   DIAGNOSTIC DATA (LABS, IMAGING, TESTING) - I reviewed patient records, labs, notes, testing and imaging myself where available.  No results found for: WBC, HGB, HCT, MCV, PLT No results found for: NA, K, CL, CO2, GLUCOSE, BUN, CREATININE, CALCIUM, PROT, ALBUMIN, AST, ALT, ALKPHOS, BILITOT, GFRNONAA, GFRAA No results found for: CHOL, HDL, LDLCALC, LDLDIRECT, TRIG, CHOLHDL No results found for: No results found for: VITAMINB12 No results found for: TSH  ASSESSMENT AND PLAN 34 y.o. year old female  has a past medical history of  Abnormality of gait, Movement disorder, and Seizures (HCC). here with:  1. Seizure  2.  Gait abnormality -No recurrent seizure -Continue Keppra 1000 mg twice a day -Call for seizure activity or headaches -Follow-up in 1 year or sooner if needed  I spent 20 minutes of face-to-face and non-face-to-face time with patient.  This included previsit chart review, lab review, study review, order entry, electronic health record documentation, patient education.  Margie Ege, AGNP-C, DNP 05/04/2020, 8:35 AM [facility] [address], [address] [place], [place] [address] [phone]

## 2020-05-04 NOTE — Patient Instructions (Signed)
Continue Keppra at current dosing Call for seizure activity  See you back in 1 year  

## 2020-05-05 NOTE — Progress Notes (Signed)
I have read the note, and I agree with the clinical assessment and plan.  Ethelwyn Gilbertson K Anupama Piehl   

## 2020-07-19 NOTE — Progress Notes (Signed)
PATIENT: Shannon Travis DOB: 02-18-1986  REASON FOR VISIT: follow up HISTORY FROM: patient  HISTORY OF PRESENT ILLNESS: Today 07/20/20  Shannon Travis is a 35 year old female with history of seizures, gait disorder, and cognitive slowing.  Her seizures have been characterized as staring off.  She is on Keppra, lives with her mother. 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 C/o for "seizure activity" events.   In October, on the couch asleep, woke up, her whole body was shaking, she called her, when her mom talked to her, calmed her down, it resolved.  She was anxious.  1st episode in December: [place] beach, coming [place] of an elevator, her legs froze, started shaking all over, she did not fall, she was nervous.  2nd episode in December: Most recent, was sitting in the pew at church, rocked back and forth, then shaking all over, church members got her mother, called 911, she was alert the whole time, no oral injury, or incontinence, complained of chest pain, EMS checked her out, everything was fine.  She was not transported to the hospital.  Still has spells with walking, where her legs will lock up.  Gets very anxious. Did PT in the summer was helpful.  Is currently seeing a counselor for anxiety once a week.  Is going to [employer] working on weight loss, is a slow process.  She is tremulous at baseline in her hands, since she was a child, worsens when nervous.  Here today for evaluation accompanied by her mother.  HISTORY  05/04/2020 SS: [name] is a 35 year old female with history of seizures, gait disorder, and cognitive slowing.  Her seizures have been characterized as staring off.  She is on Keppra, lives with her mother.  On 9/26, went to urgent care complaining of left frontal headache occurred suddenly while at church, given Toradol shot, it went away.  No further headache.  No recent falls, still has fear of walking in crosswalks, sometimes freezes, but is  doing better.  She is going to [employer] working on weight loss, considering getting a Psychologist, educational.  She is not very active, enjoys watching TV.  No recent seizure.  She manages Keppra, takes consistently every day.  Here today accompanied by her mother.  REVIEW OF SYSTEMS: Out of a complete 14 system review of symptoms, the patient complains only of the following symptoms, and all other reviewed systems are negative.  Walking difficulty, anxiety  ALLERGIES: No Known Allergies  HOME MEDICATIONS: Outpatient Medications Prior to Visit  Medication Sig Dispense Refill  . calcium carbonate (OS-CAL - DOSED IN MG OF ELEMENTAL CALCIUM) 1250 (500 Ca) MG tablet Take by mouth.    . levETIRAcetam (KEPPRA) 1000 MG tablet Take 1 tablet (1,000 mg total) by mouth 2 (two) times daily. 180 tablet 3  . meloxicam (MOBIC) 15 MG tablet Take 15 mg by mouth as needed.    . metFORMIN (GLUCOPHAGE) 500 MG tablet Take 500 mg by mouth daily.    . norethindrone (MICRONOR) 0.35 MG tablet Take by mouth.    Marland Kitchen VITAMIN D, CHOLECALCIFEROL, PO Take by mouth.    . Cholecalciferol (VITAMIN D3) 400 UNITS CAPS Take 1 tablet by mouth daily. (Patient not taking: Reported on 07/20/2020)     No facility-administered medications prior to visit.    PAST MEDICAL HISTORY: Past Medical History:  Diagnosis Date  . Abnormality of gait   . Movement disorder   . Seizures (HCC)  PAST SURGICAL HISTORY: Past Surgical History:  Procedure Laterality Date  . CYSTECTOMY Left    leg    FAMILY HISTORY: Family History  Problem Relation Age of Onset  . Healthy Mother     SOCIAL HISTORY: Social History   Socioeconomic History  . Marital status: Single    Spouse name: Not on file  . Number of children: 0  . Years of education: 59  . Highest education level: Not on file  Occupational History  . Not on file  Tobacco Use  . Smoking status: Never Smoker  . Smokeless tobacco: Never Used  Substance and Sexual Activity  . Alcohol  use: No  . Drug use: No  . Sexual activity: Not on file  Other Topics Concern  . Not on file  Social History Narrative   Patient lives at home with her mother Hollie Beach.    Patient is currently working part time.    Patient is single.    Patient has no children.    Patient has high school education.    Social Determinants of Health   Financial Resource Strain: Not on file  Food Insecurity: Not on file  Transportation Needs: Not on file  Physical Activity: Not on file  Stress: Not on file  Social Connections: Not on file  Intimate Partner Violence: Not on file   PHYSICAL EXAM  Vitals:   07/20/20 0755  BP: (!) 142/92  Pulse: 99  Weight: 288 lb 9.6 oz (130.9 kg)  Height: 5\' 7"  (1.702 m)   Body mass index is 45.2 kg/m.  Generalized: Well developed, in no acute distress   Neurological examination  Mentation: Alert oriented to time, place, history taking. Follows all commands speech and language fluent but cognitive slowing Cranial nerve II-XII: Pupils were equal round reactive to light. Extraocular movements were full, visual field were full on confrontational test. Facial sensation and strength were normal. Head turning and shoulder shrug  were normal and symmetric. Motor: Good strength all extremities Sensory: Sensory testing is intact to soft touch on all 4 extremities. No evidence of extinction is noted.  Coordination: Cerebellar testing reveals good finger-nose-finger and heel-to-shin bilaterally. Mild to moderate tremor with finger-nose-finger bilaterally. Gait and station: Gait is normal, but is cautious, is very slow intentional when getting up on the exam table, navigating the stool Reflexes: Deep tendon reflexes are symmetric and normal bilaterally.   DIAGNOSTIC DATA (LABS, IMAGING, TESTING) - I reviewed patient records, labs, notes, testing and imaging myself where available.  No results found for: WBC, HGB, HCT, MCV, PLT No results found for: NA, K, CL,  CO2, GLUCOSE, BUN, CREATININE, CALCIUM, PROT, ALBUMIN, AST, ALT, ALKPHOS, BILITOT, GFRNONAA, GFRAA No results found for: CHOL, HDL, LDLCALC, LDLDIRECT, TRIG, CHOLHDL No results found for: No results found for: VITAMINB12 No results found for: TSH  ASSESSMENT AND PLAN 35 y.o. year old female  has a past medical history of Abnormality of gait, Movement disorder, and Seizures (HCC). here with:  1.  Seizure 2.  Gait abnormality 3. Tremor  4. Cognitive Delay   I am not clear that the recent reported " seizure-like" events represent true seizures.  Historically, her seizures have been characterized as staring off.  During the most recent reported events, she is alert, able to converse, and improved when consoled.  There is never any oral injury or incontinence.  I will however go ahead and recheck an EEG.  For now, remain on Keppra 1000 mg twice a day.  We could potentially do a trial of a dose increase.  In July, Keppra level was therapeutic.  Suggest she discuss better management of her anxiety with psychiatry/counselor. Will see her back in 6 months, let me know if spells continue. Scat bus papers were filled out today.   I spent 30 minutes of face-to-face and non-face-to-face time with patient.  This included previsit chart review, lab review, study review, order entry, electronic health record documentation, patient education.  Butler Denmark, AGNP-C, DNP 07/20/2020, 8:48 AM [facility] [address] [place], [place] [address] [phone]

## 2020-07-20 ENCOUNTER — Ambulatory Visit: Payer: Medicaid Other | Admitting: Neurology

## 2020-07-20 ENCOUNTER — Encounter: Payer: Self-pay | Admitting: Neurology

## 2020-07-20 VITALS — BP 142/92 | HR 99 | Ht 67.0 in | Wt 288.6 lb

## 2020-07-20 DIAGNOSIS — G40209 Localization-related (focal) (partial) symptomatic epilepsy and epileptic syndromes with complex partial seizures, not intractable, without status epilepticus: Secondary | ICD-10-CM | POA: Diagnosis not present

## 2020-07-20 DIAGNOSIS — R269 Unspecified abnormalities of gait and mobility: Secondary | ICD-10-CM | POA: Diagnosis not present

## 2020-07-20 NOTE — Progress Notes (Signed)
I have read the note, and I agree with the clinical assessment and plan.  Abigayl Hor K Leyah Bocchino   

## 2020-07-20 NOTE — Patient Instructions (Signed)
Let's check EEG  Discuss with your counselor about anxiety  Continue the Keppra at current dose for now See you back in 6 months

## 2020-07-25 ENCOUNTER — Ambulatory Visit: Payer: Medicaid Other | Admitting: Neurology

## 2020-07-25 ENCOUNTER — Other Ambulatory Visit: Payer: Self-pay

## 2020-07-25 DIAGNOSIS — G40209 Localization-related (focal) (partial) symptomatic epilepsy and epileptic syndromes with complex partial seizures, not intractable, without status epilepticus: Secondary | ICD-10-CM

## 2020-07-27 ENCOUNTER — Other Ambulatory Visit: Payer: Medicaid Other

## 2020-08-08 ENCOUNTER — Telehealth: Payer: Self-pay | Admitting: Neurology

## 2020-08-08 NOTE — Telephone Encounter (Signed)
Pt's mother is wanting to know when she will be called with her EEG results. Please advise.

## 2020-08-08 NOTE — Telephone Encounter (Signed)
Patient had her EEG on July 25, 2020.  I will send to Dr. Terrace Arabia for review.

## 2020-08-09 NOTE — Telephone Encounter (Signed)
Sent message to [employer], EEG tech to see if he can print for Dr. Terrace Arabia to read.

## 2020-08-10 NOTE — Telephone Encounter (Signed)
Please call patient, EEG showed mild background slowing, indicating mild bihemispheric malfunction, there was no epileptiform discharge

## 2020-08-10 NOTE — Procedures (Signed)
   HISTORY: 35 year old female, presented with seizure cognitive slowing,  TECHNIQUE:  This is a routine 16 channel EEG recording with one channel devoted to a limited EKG recording.  It was performed during wakefulness, drowsiness and asleep.  Hyperventilation and photic stimulation were performed as activating procedures.  There are frequent eye movement and electrode artifact artifact noted.  Upon maximum arousal, posterior dominant waking rhythm consistent of rhythmic alpha range activity, with frequency of 7 hz. Activities are symmetric over the bilateral posterior derivations and attenuated with eye opening.  Hyperventilation produced mild/moderate buildup with higher amplitude and the slower activities noted.  Photic stimulation did not alter the tracing.  During EEG recording, patient developed drowsiness and but no deeper stage of sleep was achieved During EEG recording, there was no epileptiform discharge noted.  EKG demonstrate sinus rhythm  CONCLUSION: This is a mild abnormal EEG.  There is mild background slowing, indicating mild bi-hemisphere malfunction, there was no evidence of epileptiform discharge.  Levert Feinstein, M.D. Ph.D.  [facility] [address] [place], [place] [address] Phone: [phone] Fax:      [phone]

## 2020-08-10 NOTE — Telephone Encounter (Signed)
Called pt and let her know that EEG did not show seizures.  She appreciated call back.  She asked if had message about SCAT form.  I relayed I did not see anything concerning scat forms.  She said ok.

## 2020-08-11 ENCOUNTER — Telehealth: Payer: Self-pay

## 2020-08-11 NOTE — Telephone Encounter (Signed)
I spoke to mother of pt and relayed the EEG results.  No changes in therapy they will call back as needed.

## 2020-08-11 NOTE — Telephone Encounter (Signed)
Pt. called & stated that her mom has been trying to reach [place] & would like a call back. Please advise.

## 2020-08-11 NOTE — Telephone Encounter (Signed)
-----   Message from Glean Salvo, NP sent at 08/11/2020  6:04 AM EST ----- Sent my chart message and Andrey Campanile called:  Shannon Travis, No seizure activity seen on EEG. If spells continue to occur please let me know. Hang in there!  Sarah   CONCLUSION: This is a mild abnormal EEG.  There is mild background slowing, indicating mild bi-hemisphere malfunction, there was no evidence of epileptiform discharge.

## 2020-09-30 DIAGNOSIS — E559 Vitamin D deficiency, unspecified: Secondary | ICD-10-CM | POA: Insufficient documentation

## 2020-12-19 ENCOUNTER — Emergency Department (HOSPITAL_COMMUNITY): Payer: Medicaid Other

## 2020-12-19 ENCOUNTER — Encounter (HOSPITAL_COMMUNITY): Payer: Self-pay | Admitting: *Deleted

## 2020-12-19 ENCOUNTER — Other Ambulatory Visit: Payer: Self-pay

## 2020-12-19 ENCOUNTER — Emergency Department (HOSPITAL_COMMUNITY)
Admission: EM | Admit: 2020-12-19 | Discharge: 2020-12-20 | Disposition: A | Discharge status: Home / Self Care | Payer: Medicaid Other | Attending: Emergency Medicine | Admitting: Emergency Medicine

## 2020-12-19 DIAGNOSIS — R569 Unspecified convulsions: Secondary | ICD-10-CM | POA: Diagnosis not present

## 2020-12-19 DIAGNOSIS — M25551 Pain in right hip: Secondary | ICD-10-CM

## 2020-12-19 DIAGNOSIS — W1830XA Fall on same level, unspecified, initial encounter: Secondary | ICD-10-CM | POA: Insufficient documentation

## 2020-12-19 DIAGNOSIS — M25561 Pain in right knee: Secondary | ICD-10-CM | POA: Diagnosis not present

## 2020-12-19 DIAGNOSIS — R109 Unspecified abdominal pain: Secondary | ICD-10-CM | POA: Insufficient documentation

## 2020-12-19 DIAGNOSIS — M545 Low back pain, unspecified: Secondary | ICD-10-CM | POA: Insufficient documentation

## 2020-12-19 DIAGNOSIS — W19XXXA Unspecified fall, initial encounter: Secondary | ICD-10-CM

## 2020-12-19 DIAGNOSIS — Y9301 Activity, walking, marching and hiking: Secondary | ICD-10-CM | POA: Diagnosis not present

## 2020-12-19 DIAGNOSIS — M25571 Pain in right ankle and joints of right foot: Secondary | ICD-10-CM

## 2020-12-19 LAB — I-STAT BETA HCG BLOOD, ED (MC, WL, AP ONLY): I-stat hCG, quantitative: <5 m[IU]/mL (ref <5)

## 2020-12-19 MED ORDER — LORAZEPAM 1 MG PO TABS
1.0000 mg | ORAL_TABLET | Freq: Once | ORAL | Status: AC
  Administered 2020-12-19: 1 mg via ORAL
  Filled 2020-12-19: qty 1

## 2020-12-19 MED ORDER — IBUPROFEN 400 MG PO TABS
800.0000 mg | ORAL_TABLET | Freq: Once | ORAL | Status: AC
  Administered 2020-12-19: 800 mg via ORAL
  Filled 2020-12-19: qty 2

## 2020-12-19 NOTE — ED Provider Notes (Signed)
Emergency Medicine Provider Triage Evaluation Note  Shannon Travis , a 35 y.o. female  was evaluated in triage.  Pt complains of fall.  History of mild cerebral palsy, tremors, has issues with ambulation.  Fell walking to [employer] today.  Complains of right knee, low back and right ankle pain.  Pain is 9 out of 10..  Review of Systems  Positive: Fall Negative: Loss of consciousness  Physical Exam  BP (!) 143/95 (BP Location: Left Arm)   Pulse (!) 105   Temp 99 F (37.2 C) (Oral)   Resp 19   SpO2 92%  Gen:   Awake, no distress   Resp:  Normal effort  MSK:   Moves extremities without difficulty  Other:  Tremulous  Medical Decision Making  Medically screening exam initiated at 7:15 PM.  Appropriate orders placed.  ENRICA CORLISS was informed that the remainder of the evaluation will be completed by another provider, this initial triage assessment does not replace that evaluation, and the importance of remaining in the ED until their evaluation is complete.  Patient here with fall.  Images placed.  Motrin offered.   Arthor Captain, PA-C 12/19/20 1916    Maia Plan, MD 12/19/20 2015

## 2020-12-19 NOTE — ED Provider Notes (Signed)
[facility] EMERGENCY DEPARTMENT Provider Note   CSN: [id number] Arrival date & time: 12/19/20  1758     History Chief Complaint  Patient presents with  . Fall    Seizure at waiting area    Shannon Travis is a 35 y.o. female with history of seizures, gait disorder on Keppra, presents to the ED for evaluation of a fall that occurred immediately prior to arrival.  States that she usually use a cane to walk but this time she walked to her mailbox without her cane.  Her right leg "locked up" and this caused her to fall mostly onto her right side.  She screamed for help and some neighbors heard her and assisted her up.  She stood up but was not really able to put weight on her right hip.  Reports severe pain in her right low back, right buttock, right groin, right knee and right ankle.  No head injury, loss of consciousness.  No neck pain.  No chest pain.  No abdominal pain.  No bladder or bowel incontinence.  No groin numbness.  Denies extremity tingling, numbness.  No oral anticoagulants.  Of note, patient was noted to have seizure-like episode in the waiting room.  She was wheeled back to a hall bed.  When asked about this patient states that she has "tremors" that worsen when she is in pain or under stress.  She states she takes Keppra for this.  HPI     Past Medical History:  Diagnosis Date  . Abnormality of gait   . Movement disorder   . Seizures [facility])     Patient Active Problem List   Diagnosis Date Noted  . Partial epilepsy with impairment of consciousness (HCC) 12/03/2012  . Abnormality of gait 12/03/2012  . Essential and other specified forms of tremor 12/03/2012  . H/O mental disorder 05/15/2011  . Cerebral palsy (HCC) 05/02/2011  . Benign essential tremor 05/02/2011  . Bilateral polycystic ovarian syndrome 02/28/2011    Past Surgical History:  Procedure Laterality Date  . CYSTECTOMY Left    leg     OB History   No obstetric history on file.      Family History  Problem Relation Age of Onset  . Healthy Mother     Social History   Tobacco Use  . Smoking status: Never Smoker  . Smokeless tobacco: Never Used  Substance Use Topics  . Alcohol use: No  . Drug use: No    Home Medications Prior to Admission medications   Medication Sig Start Date End Date Taking? Authorizing Provider  calcium carbonate (OS-CAL - DOSED IN MG OF ELEMENTAL CALCIUM) 1250 (500 Ca) MG tablet Take by mouth. 12/18/19   [provider]  Cholecalciferol (VITAMIN D3) 400 UNITS CAPS Take 1 tablet by mouth daily. Patient not taking: Reported on 07/20/2020    [provider]  levETIRAcetam (KEPPRA) 1000 MG tablet Take 1 tablet (1,000 mg total) by mouth 2 (two) times daily. 05/04/20   Glean Salvo, NP  meloxicam (MOBIC) 15 MG tablet Take 15 mg by mouth as needed. 12/17/19   [provider]  metFORMIN (GLUCOPHAGE) 500 MG tablet Take 500 mg by mouth daily. 06/01/20   [provider]  norethindrone (MICRONOR) 0.35 MG tablet Take by mouth. 11/25/19   [provider]  VITAMIN D, CHOLECALCIFEROL, PO Take by mouth.    [provider]    Allergies    Patient has no known allergies.  Review  of Systems   Review of Systems  Musculoskeletal: Positive for arthralgias, gait problem and myalgias.  Neurological: Positive for tremors.  All other systems reviewed and are negative.   Physical Exam Updated Vital Signs BP 129/79 (BP Location: Left Arm)   Pulse 90   Temp 99 F (37.2 C) (Oral)   Resp 15   SpO2 98%   Physical Exam Vitals and nursing note reviewed.  Constitutional:      General: She is not in acute distress.    Appearance: She is well-developed.     Comments: Awake.  In hall bed.  HENT:     Head: Normocephalic and atraumatic.     Comments: No signs of facial or scalp trauma, tenderness    Right Ear: External ear normal.     Left Ear: External ear normal.     Nose: Nose normal.  Eyes:      General: No scleral icterus.    Conjunctiva/sclera: Conjunctivae normal.  Neck:     Comments: No midline or paraspinal cervical tenderness.  No cervical collar in place. Cardiovascular:     Rate and Rhythm: Normal rate and regular rhythm.     Heart sounds: Normal heart sounds. No murmur heard.     Comments: 1+ radial and DP pulses bilaterally.  No lower extremity edema. Pulmonary:     Effort: Pulmonary effort is normal.     Breath sounds: Normal breath sounds. No wheezing.  Abdominal:     Palpations: Abdomen is soft.     Tenderness: There is no abdominal tenderness.  Musculoskeletal:        General: No deformity. Normal range of motion.     Cervical back: Normal range of motion and neck supple.     Comments: Patient unable to sit up due to pain.  She was able to roll to her left for exam.  T-spine: No midline or paraspinal tenderness.  No contusions, ecchymosis  L-spine: Right-sided paraspinal, buttock and hip tenderness.  No midline or left-sided tenderness.  Pain in the right hip/inguinal crease with leg roll.  I attempted to flex patient's hip but she did not allow me to do this due to the pain.  Diffuse right great trochanteric, right inguinal and right proximal thigh tenderness.  Diffuse right ankle and knee tenderness, both medially laterally and posteriorly.  No obvious edema, skin abnormalities, abrasions.  No calf tenderness. She was able to lift right leg off bed a few inches for a few seconds due to pain.   Skin:    General: Skin is warm and dry.     Capillary Refill: Capillary refill takes less than 2 seconds.  Neurological:     Mental Status: She is alert and oriented to person, place, and time.     Comments: Sensation in the right lower extremities intact and symmetric.  Normal strength in the left leg.  Normal strength with ankle flexion and extension.  Patient cannot lift her right leg off the bed for strength testing at the hip and knee.  She was able to lift the right  leg off the bed a couple of inches for a few seconds, limited due to pain.  Psychiatric:        Behavior: Behavior normal.        Thought Content: Thought content normal.        Judgment: Judgment normal.     ED Results / Procedures / Treatments   Labs (all labs ordered are listed, but only abnormal results  are displayed) Labs Reviewed  I-STAT BETA HCG BLOOD, ED ([facility], [facility], [facility] ONLY)    EKG None  Radiology DG Lumbar Spine Complete  Result Date: 12/19/2020 CLINICAL DATA:  Post fall. Lumbosacral, right hip, right knee, and right ankle pain. EXAM: LUMBAR SPINE - COMPLETE 4+ VIEW COMPARISON:  None. FINDINGS: Five non-rib-bearing lumbar vertebra. The alignment is maintained. Vertebral body heights are normal. There is no listhesis. The posterior elements are intact. Minor L5-S1 disc space narrowing, remaining disc spaces are preserved. No visualized pars defects. No fracture. Sacroiliac joints are symmetric and normal. IMPRESSION: No fracture.  Minor L5-S1 disc space narrowing. Electronically Signed   By: Narda RutherfordMelanie  Sanford M.D.   On: 12/19/2020 23:17   DG Ankle Complete Right  Result Date: 12/19/2020 CLINICAL DATA:  Post fall. Lumbosacral, right hip, right knee, and right ankle pain. EXAM: RIGHT ANKLE - COMPLETE 3+ VIEW COMPARISON:  None. FINDINGS: There is no evidence of fracture, dislocation, or joint effusion. Ankle mortise is preserved. There is no evidence of arthropathy or other focal bone abnormality. Soft tissues are unremarkable. IMPRESSION: Negative radiographs of the right ankle. Electronically Signed   By: Narda RutherfordMelanie  Sanford M.D.   On: 12/19/2020 23:19   DG Knee Complete 4 Views Right  Result Date: 12/19/2020 CLINICAL DATA:  Post fall. Lumbosacral, right hip, right knee, and right ankle pain. EXAM: RIGHT KNEE - COMPLETE 4+ VIEW COMPARISON:  None. FINDINGS: No evidence of fracture, dislocation, or joint effusion. Normal joint spaces and alignment. No evidence of arthropathy or other focal  bone abnormality. Soft tissues are unremarkable. IMPRESSION: Negative radiographs of the right knee. Electronically Signed   By: Narda RutherfordMelanie  Sanford M.D.   On: 12/19/2020 23:17   DG Hip Unilat W or Wo Pelvis 2-3 Views Right  Result Date: 12/19/2020 CLINICAL DATA:  Post fall. Lumbosacral, right hip, right knee, and right ankle pain. EXAM: DG HIP (WITH OR WITHOUT PELVIS) 2-3V RIGHT COMPARISON:  Pelvis and left hip radiograph 10/23/2012 FINDINGS: The cortical margins of the bony pelvis and right hip are intact. No fracture. Right hip joint space is preserved. Femoral head is well seated. Intact pubic rami. Mild chronic widening of the pubic symphysis, stable from prior. IMPRESSION: No fracture or dislocation of the pelvis or right hip. Electronically Signed   By: Narda RutherfordMelanie  Sanford M.D.   On: 12/19/2020 23:19    Procedures Procedures   Medications Ordered in ED Medications  ibuprofen (ADVIL) tablet 800 mg (800 mg Oral Given 12/19/20 1937)  LORazepam (ATIVAN) tablet 1 mg (1 mg Oral Given 12/19/20 2154)    ED Course  I have reviewed the triage vital signs and the nursing notes.  Pertinent labs & imaging results that were available during my care of the patient were reviewed by me and considered in my medical decision making (see chart for details).    MDM Rules/Calculators/A&P                          35 year old female with reported history of cerebral palsy, unsteady gait at baseline who ambulates with a cane presents to the ED for mechanical fall.  She walked to her mailbox without her cane and her right leg locked up and she fell on her right side.  Reports history of her right leg locking up in the past.  The majority of her pain is in her right lumbar musculature, right buttocks, right hip right inguinal crease as well as her right knee and ankle.  Exam is somewhat limited due to body habitus and pain.  No obvious leg shortening or malrotation, distal neuro pulse deficits.  X-rays reviewed and  interpreted by me.  No acute findings.  I reevaluated patient who has had improvement in pain after ibuprofen.  Slight improvement in range of motion of the right hip and leg.  We will plan to place in a right ankle brace, right knee sleeve and attempt to sit up and stand.  If able, patient can be discharged.  She has a cane at home that she can use to ambulate.  I discussed high-dose NSAIDs, ice, rest and PCP follow-up as needed.  If unable to stand up or pain becomes severe, will need CT scans to rule out occult fracture.  Patient care transferred to oncoming ED PA Muthersbaugh who will follow up and reassess patient as needed.  Final Clinical Impression(s) / ED Diagnoses Final diagnoses:  Fall  Right hip pain  Acute pain of right knee  Acute right ankle pain    Rx / DC Orders ED Discharge Orders    None       Liberty Handy, PA-C 12/20/20 0005    Wynetta Fines, MD 12/22/20 [phone]

## 2020-12-19 NOTE — ED Provider Notes (Signed)
Patient began shaking all over in her wheelchair as if having a seizure however was immediately able to talk to me and tell me it was because of pain.  This did not appear to be a true tonic-clonic seizure and I do not feel that her activity warrants immediate evaluation.  She was sit in the triage area until she is called back and seen as there are far more emergent cases who are waiting.   Arthor Captain, PA-C 12/19/20 2029    Maia Plan, MD 12/19/20 2326

## 2020-12-19 NOTE — ED Notes (Signed)
Seizure episode while at waiting area , evaluated by PA at triage , charge nurse notified.

## 2020-12-19 NOTE — Discharge Instructions (Addendum)
Imaging done today did not show any injuries, fractures, dislocations  Your pain is likely from a contusion or bruise, or pulled muscles  For pain and soreness you may take ibuprofen or acetaminophen, separately or combined for maximal pain control.    Take [phone] mg acetaminophen (tylenol) every 6 hours or 600 mg ibuprofen (advil, motrin) every 6 hours.  You can take these separately or combine them every 6 hours for maximum pain control. Do not exceed 4,000 mg acetaminophen or 2,400 mg ibuprofen in a 24 hour period.  Do not take ibuprofen containing products if you have history of kidney disease, ulcers, GI bleeding, severe acid reflux, take a blood thinner or may be pregnant.  Do not take acetaminophen if you have liver disease.   Return for worsening or severe pain or new concerns  Follow up with your primary care doctor in 1 week if symptoms are not improving

## 2020-12-19 NOTE — ED Triage Notes (Signed)
Pt arrived by [facility]. Pt was walking to mailbox and had a fall. Has right knee pain and lower back pain. Pt was very anxious on ems arrival but is not calm at triage.

## 2021-01-18 ENCOUNTER — Other Ambulatory Visit: Payer: Self-pay

## 2021-01-18 ENCOUNTER — Encounter: Payer: Self-pay | Admitting: Neurology

## 2021-01-18 ENCOUNTER — Ambulatory Visit (INDEPENDENT_AMBULATORY_CARE_PROVIDER_SITE_OTHER): Payer: Medicaid Other | Admitting: Neurology

## 2021-01-18 VITALS — BP 141/76 | HR 94 | Ht 67.0 in | Wt 303.0 lb

## 2021-01-18 DIAGNOSIS — R269 Unspecified abnormalities of gait and mobility: Secondary | ICD-10-CM

## 2021-01-18 DIAGNOSIS — E538 Deficiency of other specified B group vitamins: Secondary | ICD-10-CM | POA: Insufficient documentation

## 2021-01-18 DIAGNOSIS — G809 Cerebral palsy, unspecified: Secondary | ICD-10-CM

## 2021-01-18 DIAGNOSIS — G40209 Localization-related (focal) (partial) symptomatic epilepsy and epileptic syndromes with complex partial seizures, not intractable, without status epilepticus: Secondary | ICD-10-CM

## 2021-01-18 DIAGNOSIS — R29898 Other symptoms and signs involving the musculoskeletal system: Secondary | ICD-10-CM | POA: Insufficient documentation

## 2021-01-18 MED ORDER — FLUOXETINE HCL 10 MG PO CAPS: ORAL_CAPSULE | ORAL | 3 refills | Status: DC

## 2021-01-18 NOTE — Patient Instructions (Signed)
We will start prozac for the anxiety, working up to 30 mg a day. If tolerating the 30 mg dose, call our office and I will call in a 40 mg capsule.

## 2021-01-18 NOTE — Progress Notes (Signed)
Reason for visit: Seizures, cerebral palsy, gait disorder, anxiety disorder  Shannon Travis is an 35 y.o. 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  The patient has staring episodes as her typical seizures.  The patient however has also had generalized tremor events unassociated with loss of consciousness that are usually associated with pain or being emotionally upset.  The patient had a fall on 19 December 2020 when she was going out to [employer].  The patient injured her right leg, x-rays were all negative.  The patient has recovered from this.  During that emergency room visit, when she was upset due to the pain she had another typical generalized shaking event unassociated with loss of consciousness.  The typical staring events that represent her seizures have not been observed by her mother in many years.  The patient does not operate a motor vehicle currently.  She is followed through psychiatry for her anxiety, but she has not been on any medications for anxiety.  The patient remains on Keppra 1000 mg twice daily, it is not clear whether or not the Keppra dosing has worsened the anxiety.  The patient appears to have a lot of fear of falling, she will freeze up frequently when she has to go up on the curb or go into an elevator.  She was to have physical therapy on her last visit but this never occurred.  The patient uses a cane for ambulation.  She does fall with some regularity.  Past Medical History:  Diagnosis Date   Abnormality of gait    Movement disorder    Seizures (HCC)     Past Surgical History:  Procedure Laterality Date   CYSTECTOMY Left    leg    Family History  Problem Relation Age of Onset   Healthy Mother     Social history:  reports that she has never smoked. She has never used smokeless tobacco. She reports that she does not drink alcohol  and does not use drugs.   No Known Allergies  Medications:  Prior to Admission medications   Medication Sig Start Date End Date Taking? Authorizing Provider  calcium carbonate (OS-CAL - DOSED IN MG OF ELEMENTAL CALCIUM) 1250 (500 Ca) MG tablet Take by mouth. 12/18/19  Yes [provider]  Cholecalciferol (VITAMIN D3) 400 UNITS CAPS Take 1 tablet by mouth daily.   Yes [provider]  levETIRAcetam (KEPPRA) 1000 MG tablet Take 1 tablet (1,000 mg total) by mouth 2 (two) times daily. 05/04/20  Yes Glean Salvo, NP  meloxicam (MOBIC) 15 MG tablet Take 15 mg by mouth as needed. 12/17/19  Yes [provider]  metFORMIN (GLUCOPHAGE) 500 MG tablet Take 500 mg by mouth daily. 06/01/20  Yes [provider]  norethindrone (MICRONOR) 0.35 MG tablet Take by mouth. 11/25/19  Yes [provider]  VITAMIN D, CHOLECALCIFEROL, PO Take by mouth.   Yes [provider]    ROS:  Out of a complete 14 system review of symptoms, the patient complains only of the following symptoms, and all other reviewed systems are negative.  Multiple falls Anxiety History of seizures  Blood pressure (!) 141/76, pulse 94, height 5\' 7"  (1.702 m), weight (!) 303 lb (137.4 kg).  Physical Exam  General: The patient is alert and cooperative at the time of the examination.  The patient is markedly  obese.  Skin: No significant peripheral edema is noted.   Neurologic Exam  Mental status: The patient is alert and oriented x 3 at the time of the examination. The patient has apparent normal recent and remote memory, with an apparently normal attention span and concentration ability.   Cranial nerves: Facial symmetry is present. Speech is normal, no aphasia or dysarthria is noted. Extraocular movements are full. Visual fields are full.  Motor: The patient has good strength in all 4 extremities.  Sensory examination: Soft touch sensation is symmetric on the face, arms, and  legs.  Coordination: The patient has good finger-nose-finger and heel-to-shin bilaterally.  The patient has athetoid movements of the head and neck, and the arms.  Gait and station: The patient walks with a cane, gait is slightly wide-based, there is a mild circumduction pattern to the gait on the left.  Tandem gait was not attempted.  Romberg is negative but is slightly unsteady.  Reflexes: Deep tendon reflexes are symmetric.   EEG 07/25/20:  CONCLUSION: This is a mild abnormal EEG.  There is mild background slowing, indicating mild bi-hemisphere malfunction, there was no evidence of epileptiform discharge.   Assessment/Plan:  1.  Cerebral palsy, athetoid  2.  Gait disorder  3.  Anxiety disorder  4.  Seizure disorder, under good control  The patient will continue on Keppra for now.  I will add Prozac to the regimen working up to 30 mg at night, if she does well on this they are to contact our office and I will convert her to the 40 mg capsule.  The patient will be sent for physical therapy.  A prescription was given for a rolling walker.  Hopefully, physical therapy can increase her confidence with walking and safety and reduce the anxiety that she has with ambulation.  She will follow-up here in 6 months.  In the future, this patient can be followed by our new epileptologist.  Marlan Palau MD 01/18/2021 8:37 AM  [facility] [address] [place], [place] [address]  Phone [phone] Fax [phone]

## 2021-01-20 ENCOUNTER — Other Ambulatory Visit: Payer: Self-pay

## 2021-01-20 ENCOUNTER — Encounter: Payer: Self-pay | Admitting: Physical Therapy

## 2021-01-20 ENCOUNTER — Ambulatory Visit: Payer: Medicaid Other | Attending: Neurology | Admitting: Physical Therapy

## 2021-01-20 DIAGNOSIS — R2689 Other abnormalities of gait and mobility: Secondary | ICD-10-CM | POA: Diagnosis not present

## 2021-01-20 DIAGNOSIS — R2681 Unsteadiness on feet: Secondary | ICD-10-CM | POA: Diagnosis present

## 2021-01-20 DIAGNOSIS — M6281 Muscle weakness (generalized): Secondary | ICD-10-CM | POA: Diagnosis present

## 2021-01-20 DIAGNOSIS — R293 Abnormal posture: Secondary | ICD-10-CM | POA: Diagnosis present

## 2021-01-20 DIAGNOSIS — R29818 Other symptoms and signs involving the nervous system: Secondary | ICD-10-CM | POA: Diagnosis present

## 2021-01-20 NOTE — Therapy (Signed)
[facility] [address] [place], [place], [address] Phone: [phone]   Fax:  [phone]  Physical Therapy Evaluation  Patient Details  Name: Shannon Travis MRN: [id number] Date of Birth: 10-Apr-1986 Referring Provider (PT): Stephanie Acre   Encounter Date: 01/20/2021   PT End of Session - 01/20/21 0849     Visit Number 1    Number of Visits 20    Date for PT Re-Evaluation 04/14/21    Authorization Type Medicaid-submitted Auth upon completion of eval    PT Start Time 0849    PT Stop Time 0932    PT Time Calculation (min) 43 min    Activity Tolerance Patient tolerated treatment well   With busy environment, she has more ataxia, more tremors   Behavior During Therapy [facility] for tasks assessed/performed             Past Medical History:  Diagnosis Date   Abnormality of gait    Movement disorder    Seizures (HCC)     Past Surgical History:  Procedure Laterality Date   CYSTECTOMY Left    leg    There were no vitals filed for this visit.    Subjective Assessment - 01/20/21 0853     Subjective Pt reports coming in for therapy because legs "lock up" and she has fallen several times.  Want to get some help for my legs.  Recent fall was going to the mailbox, legs locked up and fell backwards.  Reports having 3 falls total.  Uses cane for mobility-for about 6 months.    Patient is accompained by: Family member   mom   Pertinent History PMH:  anxiety, seizure    How long can you stand comfortably? depends on situation; a few minutes    How long can you walk comfortably? depends, more difficulty outdoor /unlevel surfaces, thresholds    Patient Stated Goals Pt's goals for therapy are to focus on legs-get them better and stronger, help with balance, stop falling.    Currently in Pain? No/denies                [facility] PT Assessment - 01/20/21 0859       Assessment   Medical Diagnosis CP, gait abnormality     Referring Provider (PT) Stephanie Acre    Onset Date/Surgical Date 01/18/21   MD visit/PT order   Prior Therapy 2021      Precautions   Precautions Fall    Precaution Comments Hx of siezures      Balance Screen   Has the patient fallen in the past 6 months Yes    How many times? 3    Has the patient had a decrease in activity level because of a fear of falling?  Yes    Is the patient reluctant to leave their home because of a fear of falling?  Yes      Home Environment   Living Environment Private residence    Living Arrangements Parent    Available Help at Discharge Family    Type of Home House    Home Access Stairs to enter    Entrance Stairs-Number of Steps 3    Entrance Stairs-Rails Right;Cannot reach both   uses cane   Home Layout One level    Home Equipment [place] - single point      Prior Function   Level of Independence Independent    Leisure Enjoys going walking, going to grocery store (stays  in car now), shopping      Observation/Other Assessments   Focus on Therapeutic Outcomes (FOTO)  NA      Posture/Postural Control   Posture/Postural Control Postural limitations    Posture Comments Tremors/ataxic movements noted in UEs      Tone   Assessment Location Right Lower Extremity;Left Lower Extremity      ROM / Strength   AROM / PROM / Strength AROM;Strength      AROM   Overall AROM  Within functional limits for tasks performed      Strength   Strength Assessment Site Hip;Knee;Ankle    Right/Left Hip Right;Left    Right Hip Flexion 5/5    Left Hip Flexion 5/5    Right/Left Knee Right;Left    Right Knee Flexion 4/5    Right Knee Extension 5/5    Left Knee Flexion 5/5    Left Knee Extension 5/5    Right/Left Ankle Right;Left    Right Ankle Dorsiflexion 5/5    Left Ankle Dorsiflexion 5/5      Transfers   Transfers Sit to Stand;Stand to Sit    Sit to Stand 5: Supervision;Without upper extremity assist;From chair/3-in-1    Five time sit to stand  comments  15.16    Stand to Sit Without upper extremity assist;To chair/3-in-1;5: Supervision      Ambulation/Gait   Ambulation/Gait Yes    Ambulation/Gait Assistance 5: Supervision;4: Min guard    Ambulation/Gait Assistance Details trialed rollator with therapist assist to control    Ambulation Distance (Feet) 60 Feet   x 2   Assistive device Straight cane   small tripod tip   Gait Pattern Step-to pattern;Step-through pattern;Decreased arm swing - left;Ataxic;Wide base of support    Ambulation Surface Level;Indoor    Gait velocity 26.69 sec = 1.23 ft/sec    Gait Comments With gait in busier surroundings of gym, pt has increased ataxic/tremor type movements and slower pace wiht gait.  She tends to look down at floor.      Standardized Balance Assessment   Standardized Balance Assessment Timed Up and Go Test      Timed Up and Go Test   Normal TUG (seconds) 44.31   with cane; stops to complete turn     High Level Balance   High Level Balance Comments STanding on solid surface EO and EC x 30 seconds with increased sway EC.  Attempted standing on foam, and pt unable to stand on foam wtihout significant forward lean and mod assist of therapist.                        Objective measurements completed on examination: See above findings.               PT Education - 01/20/21 1511     Education Details PT POC and eval results    Person(s) Educated Patient;Parent(s)    Methods Explanation    Comprehension Verbalized understanding              PT Short Term Goals - 01/20/21 1518       PT SHORT TERM GOAL #1   Title Pt will perform HEP with family supervision for strength, balance, gait for improved mobility.  TARGET 02/17/2021    Baseline has HEP from previous therapy last year (at another facility); needs updateing to HEP    Time 3    Period Weeks    Status New  PT SHORT TERM GOAL #2   Title Pt will perform 5x sit<>stand in less than or equal to 13  seconds for improved functional lower extremity strength.    Baseline 15.16 sec    Time 3    Period Weeks    Status New      PT SHORT TERM GOAL #3   Title Pt will demonstrate ability to safey ambulate 100-200 ft using rollator device with supervision, for improved safety with gait.    Baseline Currently uses cane; initial increase in ataxia with rollator, with min assist    Time 3    Period Weeks    Status New               PT Long Term Goals - 01/20/21 1521       PT LONG TERM GOAL #1   Title Pt will perform progression of HEP for improved strength, transfers, balance, gait.  TARGET 04/21/2021    Baseline has HEP from previous therapy last year (at another facility); needs updateing to HEP    Time 12    Period Weeks    Status New      PT LONG TERM GOAL #2   Title Pt will improve 5x sit<>stand to less than or equal to 11.5 sec to demonstrate improved functional strength and transfer efficiency.    Baseline 15.16 sec at eval    Time 12    Period Weeks    Status New      PT LONG TERM GOAL #3   Title Pt will improve TUG score to less than or equal to 30 sec for decreased fall risk.    Baseline TUG 44.31 sec (scores >30 sec indicate increased falls, difficulty with ADLs in home)    Time 12    Period Weeks    Status New      PT LONG TERM GOAL #4   Title Pt will improve gait velocity to at least 1.8 ft/sec with appropriate assistive device, for improved gait efficiency and safety.    Baseline 1.23 ft/sec with cane    Time 12    Period Weeks    Status New      PT LONG TERM GOAL #5   Title Pt will verbalize understanding of fall prevention in home environment.    Baseline hx of 3 falls in past 6 months    Time 12    Period Weeks    Status New                    Plan - 01/20/21 1513     Clinical Impression Statement Pt is a 35 yo female who presents to OPPT with history of cerebral palsy and falls.  She has had at least 3 falls in past 6 months; she  reports increased fear of falling to the point it is limiting her community and household mobility.  She presents with decreased functional strength, decreased balance, likely decreased vestibular system use for balance, decreased stability, posture, timing and coordination of gait.  She demonstrates upper/lower body tremors that increase with stressor of busy environment.  She will benefit from skill PT to furtehr address the above stated deficits to decrease fall risk and improve overall functional mobility.    Personal Factors and Comorbidities Comorbidity 2    Comorbidities PMH:  anxiety, seizure    Examination-Activity Limitations Locomotion Level;Transfers;Stairs;Stand    Examination-Participation Restrictions Community Activity;Shop    Stability/Clinical Decision Making Evolving/Moderate complexity  Clinical Decision Making Moderate    Rehab Potential Good    PT Frequency Other (comment)   1x/wk for 3 weeks, then 2x/wk for 8 weeks   PT Duration Other (comment)   11 weeks total POC, 12 wk POC written to capture weeks authorizing Medicaid   PT Treatment/Interventions ADLs/Self Care Home Management;DME Instruction;Neuromuscular re-education;Balance training;Therapeutic exercise;Therapeutic activities;Functional mobility training;Stair training;Gait training;Patient/family education;Aquatic Therapy    PT Next Visit Plan Initiate HEP (see what pt is doing from previous therapy in 2021); work on lower extremity/trunk stability, balance on solid, working towards compliant surfaces; gait training with rollator (pt already has order on chart, but needs practice)    Consulted and Agree with Plan of Care Patient;Family member/caregiver    Family Member Consulted mother             Patient will benefit from skilled therapeutic intervention in order to improve the following deficits and impairments:  Abnormal gait, Difficulty walking, Impaired tone, Decreased balance, Decreased mobility, Decreased  strength, Postural dysfunction  Visit Diagnosis: Other abnormalities of gait and mobility  Unsteadiness on feet  Muscle weakness (generalized)  Abnormal posture  Other symptoms and signs involving the nervous system     Problem List Patient Active Problem List   Diagnosis Date Noted   Partial epilepsy with impairment of consciousness (HCC) 12/03/2012   Abnormality of gait 12/03/2012   Essential and other specified forms of tremor 12/03/2012   H/O mental disorder 05/15/2011   Cerebral palsy (HCC) 05/02/2011   Benign essential tremor 05/02/2011   Bilateral polycystic ovarian syndrome 02/28/2011    Konnie Noffsinger W. 01/20/2021, 3:27 PM  Gean Maidens., PT  [facility] [address] [place], [place], [address] Phone: [phone]   Fax:  [phone]  Name: Shannon Travis MRN: [id number] Date of Birth: 20-Nov-1985

## 2021-01-30 ENCOUNTER — Other Ambulatory Visit: Payer: Self-pay

## 2021-01-30 ENCOUNTER — Ambulatory Visit: Payer: Medicaid Other | Admitting: Physical Therapy

## 2021-01-30 DIAGNOSIS — R2689 Other abnormalities of gait and mobility: Secondary | ICD-10-CM

## 2021-01-30 DIAGNOSIS — R2681 Unsteadiness on feet: Secondary | ICD-10-CM

## 2021-01-30 DIAGNOSIS — M6281 Muscle weakness (generalized): Secondary | ICD-10-CM

## 2021-01-30 NOTE — Patient Instructions (Signed)
Provided handout for standing hip abduction, 10 reps, 2-3 sets, 1x/day

## 2021-01-30 NOTE — Therapy (Signed)
[facility] [address] [place], [place], [address] Phone: [phone]   Fax:  [phone]  Physical Therapy Treatment  Patient Details  Name: Shannon Travis MRN: [id number] Date of Birth: 08-24-1985 Referring Provider (PT): Stephanie Acre   Encounter Date: 01/30/2021   PT End of Session - 01/30/21 1155     Visit Number 2    Number of Visits 20    Date for PT Re-Evaluation 04/14/21    Authorization Type Medicaid-submitted Auth upon completion of eval (PT forgot to email John at eval; emailed 01/30/21 after internet service restored this am)    PT Start Time 0720    PT Stop Time 0800    PT Time Calculation (min) 40 min    Activity Tolerance Patient tolerated treatment well   With busy environment, she has more ataxia, more tremors   Behavior During Therapy [facility] for tasks assessed/performed             Past Medical History:  Diagnosis Date   Abnormality of gait    Movement disorder    Seizures (HCC)     Past Surgical History:  Procedure Laterality Date   CYSTECTOMY Left    leg    There were no vitals filed for this visit.   Subjective Assessment - 01/30/21 1154     Subjective No changes, no falls.  Previous HEP:  sit to stand, LAQ, seated march, lunges, hamstring curls (standing), walking in the house.  Do these in the house everyday.    Patient is accompained by: Family member   mom   Pertinent History PMH:  anxiety, seizure    How long can you stand comfortably? depends on situation; a few minutes    How long can you walk comfortably? depends, more difficulty outdoor /unlevel surfaces, thresholds    Patient Stated Goals Pt's goals for therapy are to focus on legs-get them better and stronger, help with balance, stop falling.    Currently in Pain? No/denies                 Therapeutic Ex: Ant> posterior pelvic tilt x 5, then neutral spine with abdominal activation:  marching in place x  5, 2 sets;  LAQ 2 sets x 5 reps; opposite arm/leg raises x 5 reps, 2 sets.  Quadruped alt UE lifts x 5 reps, then alt legs lifts x 3 reps (extra time and min assist provided for leg lifts).  Cues for abdominal activation  Standing at counter: Bilat heel raises 2 sets x 10 reps, minisquats 2 sets x 10, hip abduction x 10 reps (added hip abduction to HEP with written handout; pt verbalize/demo understanding) Sidestepping R and L, 3 reps along counter, UE support Single limb stance, with step taps to 4" step, BUE support, 10 reps; stagger stance forward/back rocking at counter 10 reps each foot position.     Gait training:  Gait using rollator, 200 ft, with cues for technique, posture, staying close to rollator.  Gait 80 ft x 2 with cane, 40 ft x 2 with cane.  Cues for posture.                PT Education - 01/30/21 1155     Education Details HEP (written handout as internet was down)    Person(s) Educated Patient;Parent(s)   discussed with mom at end of session   Methods Explanation;Demonstration;Handout    Comprehension Verbalized understanding;Returned demonstration  PT Short Term Goals - 01/20/21 1518       PT SHORT TERM GOAL #1   Title Pt will perform HEP with family supervision for strength, balance, gait for improved mobility.  TARGET 02/17/2021    Baseline has HEP from previous therapy last year (at another facility); needs updateing to HEP    Time 3    Period Weeks    Status New      PT SHORT TERM GOAL #2   Title Pt will perform 5x sit<>stand in less than or equal to 13 seconds for improved functional lower extremity strength.    Baseline 15.16 sec    Time 3    Period Weeks    Status New      PT SHORT TERM GOAL #3   Title Pt will demonstrate ability to safey ambulate 100-200 ft using rollator device with supervision, for improved safety with gait.    Baseline Currently uses cane; initial increase in ataxia with rollator, with min assist     Time 3    Period Weeks    Status New               PT Long Term Goals - 01/20/21 1521       PT LONG TERM GOAL #1   Title Pt will perform progression of HEP for improved strength, transfers, balance, gait.  TARGET 04/21/2021    Baseline has HEP from previous therapy last year (at another facility); needs updateing to HEP    Time 12    Period Weeks    Status New      PT LONG TERM GOAL #2   Title Pt will improve 5x sit<>stand to less than or equal to 11.5 sec to demonstrate improved functional strength and transfer efficiency.    Baseline 15.16 sec at eval    Time 12    Period Weeks    Status New      PT LONG TERM GOAL #3   Title Pt will improve TUG score to less than or equal to 30 sec for decreased fall risk.    Baseline TUG 44.31 sec (scores >30 sec indicate increased falls, difficulty with ADLs in home)    Time 12    Period Weeks    Status New      PT LONG TERM GOAL #4   Title Pt will improve gait velocity to at least 1.8 ft/sec with appropriate assistive device, for improved gait efficiency and safety.    Baseline 1.23 ft/sec with cane    Time 12    Period Weeks    Status New      PT LONG TERM GOAL #5   Title Pt will verbalize understanding of fall prevention in home environment.    Baseline hx of 3 falls in past 6 months    Time 12    Period Weeks    Status New                   Plan - 01/30/21 1156     Clinical Impression Statement Skilled PT session focused on trunk and lower extremity strengthening.  Pt needs extra time transitioning between sets of movement patterns, for coordination, posture, UE support.  Also trialed again rollator walker in today's session, with pt demonstrating improved control from first trial at eval.  She will continue to benefit from skilled PT to further address strength, balance, gait training for optimal safety and independence with gait.    Personal Factors and  Comorbidities Comorbidity 2    Comorbidities PMH:   anxiety, seizure    Examination-Activity Limitations Locomotion Level;Transfers;Stairs;Stand    Examination-Participation Restrictions Community Activity;Shop    Stability/Clinical Decision Making Evolving/Moderate complexity    Rehab Potential Good    PT Frequency Other (comment)   1x/wk for 3 weeks, then 2x/wk for 8 weeks   PT Duration Other (comment)   11 weeks total POC, 12 wk POC written to capture weeks authorizing Medicaid   PT Treatment/Interventions ADLs/Self Care Home Management;DME Instruction;Neuromuscular re-education;Balance training;Therapeutic exercise;Therapeutic activities;Functional mobility training;Stair training;Gait training;Patient/family education;Aquatic Therapy    PT Next Visit Plan Review HEP; continue to work on lower extremity/trunk stability, balance on solid, working towards compliant surfaces; gait training with rollator (pt already has order on chart, but needs additional practice)    Consulted and Agree with Plan of Care Patient;Family member/caregiver    Family Member Consulted mother-at end of session             Patient will benefit from skilled therapeutic intervention in order to improve the following deficits and impairments:  Abnormal gait, Difficulty walking, Impaired tone, Decreased balance, Decreased mobility, Decreased strength, Postural dysfunction  Visit Diagnosis: Muscle weakness (generalized)  Unsteadiness on feet  Other abnormalities of gait and mobility     Problem List Patient Active Problem List   Diagnosis Date Noted   Partial epilepsy with impairment of consciousness (HCC) 12/03/2012   Abnormality of gait 12/03/2012   Essential and other specified forms of tremor 12/03/2012   H/O mental disorder 05/15/2011   Cerebral palsy (HCC) 05/02/2011   Benign essential tremor 05/02/2011   Bilateral polycystic ovarian syndrome 02/28/2011    Deyton Ellenbecker W. 01/30/2021, 11:59 AM Gean Maidens., PT   [facility] [address] [place], [place], [address] Phone: [phone]   Fax:  [phone]  Name: Shannon Travis MRN: [id number] Date of Birth: [birth year]

## 2021-02-13 ENCOUNTER — Other Ambulatory Visit: Payer: Self-pay

## 2021-02-13 ENCOUNTER — Ambulatory Visit: Payer: Medicaid Other | Attending: Neurology | Admitting: Physical Therapy

## 2021-02-13 ENCOUNTER — Encounter: Payer: Self-pay | Admitting: Physical Therapy

## 2021-02-13 DIAGNOSIS — R2689 Other abnormalities of gait and mobility: Secondary | ICD-10-CM | POA: Diagnosis present

## 2021-02-13 DIAGNOSIS — M6281 Muscle weakness (generalized): Secondary | ICD-10-CM | POA: Diagnosis present

## 2021-02-13 DIAGNOSIS — R293 Abnormal posture: Secondary | ICD-10-CM | POA: Diagnosis present

## 2021-02-13 DIAGNOSIS — R2681 Unsteadiness on feet: Secondary | ICD-10-CM | POA: Insufficient documentation

## 2021-02-13 NOTE — Therapy (Signed)
[facility] [address] [place], [place], [address] Phone: [phone]   Fax:  [phone]  Physical Therapy Treatment  Patient Details  Name: Shannon Travis MRN: [id number] Date of Birth: August 28, 1985 Referring Provider (PT): Stephanie Acre   Encounter Date: 02/13/2021   PT End of Session - 02/13/21 0822     Visit Number 3    Number of Visits 20    Date for PT Re-Evaluation 04/14/21    Authorization Type Medicaid-submitted Auth upon completion of eval (PT forgot to email John at eval; emailed 01/30/21 after internet service restored this am; sent message to [facility] 02/13/21 about auth-?)    PT Start Time 0804    PT Stop Time 0846    PT Time Calculation (min) 42 min    Activity Tolerance Patient tolerated treatment well    Behavior During Therapy [facility] for tasks assessed/performed             Past Medical History:  Diagnosis Date   Abnormality of gait    Movement disorder    Seizures (HCC)     Past Surgical History:  Procedure Laterality Date   CYSTECTOMY Left    leg    There were no vitals filed for this visit.   Subjective Assessment - 02/13/21 0807     Subjective No falls.  Did have an episode [place] the beach yesterday at [place] elevator where my legs locked up.  Mom was able to help me and I didn't fall.    Patient is accompained by: Family member   mom   Pertinent History PMH:  anxiety, seizure    How long can you stand comfortably? depends on situation; a few minutes    How long can you walk comfortably? depends, more difficulty outdoor /unlevel surfaces, thresholds    Patient Stated Goals Pt's goals for therapy are to focus on legs-get them better and stronger, help with balance, stop falling.    Currently in Pain? No/denies                  Therapeutic Exercise   Standing at counter: Bilat heel raises 2 sets x 10 reps, minisquats 2 sets x 10  Reviewed hip abduction x 10 reps (pt  verbalize/demo understanding); performed 2nd set alternating legs at counter with support Sidestepping R and L, 3 reps along counter (No UE support)            [facility] Adult PT Treatment/Exercise - 02/13/21 0001       Transfers   Transfers Sit to Stand;Stand to Sit    Sit to Stand 5: Supervision;Without upper extremity assist;From chair/3-in-1    Stand to Sit Without upper extremity assist;To chair/3-in-1;5: Supervision      Ambulation/Gait   Ambulation/Gait Yes    Ambulation/Gait Assistance 5: Supervision;4: Min guard    Ambulation Distance (Feet) 80 Feet   x 4; 60 ft x 2   Assistive device Straight cane    Gait Pattern Step-through pattern;Decreased arm swing - left;Ataxic;Wide base of support    Ambulation Surface Level;Indoor    Pre-Gait Activities Discussed about rollator and pt would like to pursue; discussed with pt and mom (at end of session) where to begin to look for ordering/obtaining rollator walker.    Gait Comments Pt appears steadier with gait today using cane; she reports having good days and bad days.                 Balance Exercises -  02/13/21 0001       Balance Exercises: Standing   Tandem Stance Eyes open;Intermittent upper extremity support;3 reps;15 secs    SLS with Vectors Solid surface;Upper extremity assist 2;Other reps (comment);Limitations    SLS with Vectors Limitations Alt step taps to 4" cabinet shelf    Standing, One Foot on a Step Eyes open;Head turns;Limitations    Standing, One Foot on a Step Limitations Head turns x 5, head nods x 5    Gait with Head Turns Forward;Upper extremity support;4 reps;Limitations    Gait with Head Turns Limitations Head turns x 2, head nods x 2, along counter    Retro Gait 3 reps;Limitations    Retro Gait Limitations Forward/back at counter    Other Standing Exercises Lateral wide BOS weightshifting prior to initiaing gait, 10 ft x 2.  Cues to use lateral weigthshifting and stop/breathe/reset (pt already  using this method) to avoid increased ataxia in busy situations.               PT Education - 02/13/21 1511     Education Details Initiated discussion about where pt can obtain rollator walker    Person(s) Educated Patient;Parent(s)    Methods Explanation;Handout    Comprehension Verbalized understanding              PT Short Term Goals - 01/20/21 1518       PT SHORT TERM GOAL #1   Title Pt will perform HEP with family supervision for strength, balance, gait for improved mobility.  TARGET 02/17/2021    Baseline has HEP from previous therapy last year (at another facility); needs updateing to HEP    Time 3    Period Weeks    Status New      PT SHORT TERM GOAL #2   Title Pt will perform 5x sit<>stand in less than or equal to 13 seconds for improved functional lower extremity strength.    Baseline 15.16 sec    Time 3    Period Weeks    Status New      PT SHORT TERM GOAL #3   Title Pt will demonstrate ability to safey ambulate 100-200 ft using rollator device with supervision, for improved safety with gait.    Baseline Currently uses cane; initial increase in ataxia with rollator, with min assist    Time 3    Period Weeks    Status New               PT Long Term Goals - 01/20/21 1521       PT LONG TERM GOAL #1   Title Pt will perform progression of HEP for improved strength, transfers, balance, gait.  TARGET 04/21/2021    Baseline has HEP from previous therapy last year (at another facility); needs updateing to HEP    Time 12    Period Weeks    Status New      PT LONG TERM GOAL #2   Title Pt will improve 5x sit<>stand to less than or equal to 11.5 sec to demonstrate improved functional strength and transfer efficiency.    Baseline 15.16 sec at eval    Time 12    Period Weeks    Status New      PT LONG TERM GOAL #3   Title Pt will improve TUG score to less than or equal to 30 sec for decreased fall risk.    Baseline TUG 44.31 sec (scores >30 sec  indicate increased falls, difficulty with  ADLs in home)    Time 12    Period Weeks    Status New      PT LONG TERM GOAL #4   Title Pt will improve gait velocity to at least 1.8 ft/sec with appropriate assistive device, for improved gait efficiency and safety.    Baseline 1.23 ft/sec with cane    Time 12    Period Weeks    Status New      PT LONG TERM GOAL #5   Title Pt will verbalize understanding of fall prevention in home environment.    Baseline hx of 3 falls in past 6 months    Time 12    Period Weeks    Status New                   Plan - 02/13/21 3536     Clinical Impression Statement Continued to focus on combination of strengthening and balance at counter.  With sidestepping exericses at counter today, pt able to perform with no support, compared to requiring UE support last visit.  In quiet gym today, pt demonstrates improved posture, control with standing exercises.  Reviewed standing hip abduction as part of initial HEP last visit, and pt return demo understanding.  STG 1 ongoing, as more exercises can be added as apporpriate to HEP.  Pt was on vacation last week, so next visit will be STG check and will need to submit for additional PT visits for POC.    Personal Factors and Comorbidities Comorbidity 2    Comorbidities PMH:  anxiety, seizure    Examination-Activity Limitations Locomotion Level;Transfers;Stairs;Stand    Examination-Participation Restrictions Community Activity;Shop    Stability/Clinical Decision Making Evolving/Moderate complexity    Rehab Potential Good    PT Frequency Other (comment)   1x/wk for 3 weeks, then 2x/wk for 8 weeks   PT Duration Other (comment)   11 weeks total POC, 12 wk POC written to capture weeks authorizing Medicaid   PT Treatment/Interventions ADLs/Self Care Home Management;DME Instruction;Neuromuscular re-education;Balance training;Therapeutic exercise;Therapeutic activities;Functional mobility training;Stair training;Gait  training;Patient/family education;Aquatic Therapy    PT Next Visit Plan *Please check STGs and request additional Medicare visits (likely ask for 2x/wk for 6 weeks).  Add to HEP as appropriate; continue to work on lower extremity/trunk stability, balance on solid, working towards compliant surfaces; gait training with rollator (pt already has order on chart, but needs additional practice and discuss how to obtain)    Consulted and Agree with Plan of Care Patient;Family member/caregiver    Family Member Consulted mother-at end of session             Patient will benefit from skilled therapeutic intervention in order to improve the following deficits and impairments:  Abnormal gait, Difficulty walking, Impaired tone, Decreased balance, Decreased mobility, Decreased strength, Postural dysfunction  Visit Diagnosis: Muscle weakness (generalized)  Unsteadiness on feet  Other abnormalities of gait and mobility     Problem List Patient Active Problem List   Diagnosis Date Noted   Partial epilepsy with impairment of consciousness (HCC) 12/03/2012   Abnormality of gait 12/03/2012   Essential and other specified forms of tremor 12/03/2012   H/O mental disorder 05/15/2011   Cerebral palsy (HCC) 05/02/2011   Benign essential tremor 05/02/2011   Bilateral polycystic ovarian syndrome 02/28/2011    Emiah Pellicano W. 02/13/2021, 3:23 PM Gean Maidens., PT  [facility] [address] [place], [place], [address] Phone: [phone]   Fax:  [phone]  Name: Rayvon CharJessica N Sudbeck MRN: [id number] Date of Birth: 1986/06/26

## 2021-02-22 ENCOUNTER — Other Ambulatory Visit: Payer: Self-pay

## 2021-02-22 ENCOUNTER — Encounter: Payer: Self-pay | Admitting: Physical Therapy

## 2021-02-22 ENCOUNTER — Ambulatory Visit: Payer: Medicaid Other | Admitting: Physical Therapy

## 2021-02-22 DIAGNOSIS — M6281 Muscle weakness (generalized): Secondary | ICD-10-CM | POA: Diagnosis not present

## 2021-02-22 DIAGNOSIS — R2689 Other abnormalities of gait and mobility: Secondary | ICD-10-CM

## 2021-02-22 DIAGNOSIS — R2681 Unsteadiness on feet: Secondary | ICD-10-CM

## 2021-02-22 NOTE — Therapy (Signed)
[facility] [address] [place], [name], [address] Phone: [phone]   Fax:  [phone]  Physical Therapy Treatment  Patient Details  Name: Shannon Travis MRN: [id number] Date of Birth: Jan 02, 1986 Referring Provider (PT): Margette Fast   Encounter Date: 02/22/2021   PT End of Session - 02/22/21 0807     Visit Number 4    Number of Visits 20    Date for PT Re-Evaluation 04/14/21    Authorization Type Medicaid    Authorization Time Period 3 visits 02/13/21- 03/05/21;    Authorization - Visit Number 2    Authorization - Number of Visits 3    PT Start Time 0804    PT Stop Time 0845    PT Time Calculation (min) 41 min    Equipment Utilized During Treatment Gait belt    Activity Tolerance Patient tolerated treatment well    Behavior During Therapy WFL for tasks assessed/performed             Past Medical History:  Diagnosis Date   Abnormality of gait    Movement disorder    Seizures ([place])     Past Surgical History:  Procedure Laterality Date   CYSTECTOMY Left    leg    There were no vitals filed for this visit.   Subjective Assessment - 02/22/21 0805     Subjective No new complaints. No falls or pain to report. Has not had a chance to get the rollator as yet, still has the order and places to go to.    Patient is accompained by: Family member   mom in lobby   Pertinent History PMH:  anxiety, seizure    How long can you stand comfortably? depends on situation; a few minutes    How long can you walk comfortably? depends, more difficulty outdoor /unlevel surfaces, thresholds    Patient Stated Goals Pt's goals for therapy are to focus on legs-get them better and stronger, help with balance, stop falling.    Currently in Pain? No/denies                [facility] PT Assessment - 02/22/21 0813       Timed Up and Go Test   TUG Normal TUG    Normal TUG (seconds) 34.96   with rollator, increased  time to get hands on rollator handles/brakes in correct spot                   [facility] Adult PT Treatment/Exercise - 02/22/21 0813       Transfers   Transfers Sit to Stand;Stand to Sit    Sit to Stand 5: Supervision;Without upper extremity assist;From chair/3-in-1;From bed    Five time sit to stand comments  12.82 sec's with no UE support from standard height surface    Stand to Sit 5: Supervision;Without upper extremity assist;To bed;To chair/3-in-1      Ambulation/Gait   Ambulation/Gait Yes    Ambulation/Gait Assistance 5: Supervision;4: Min guard    Ambulation/Gait Assistance Details cues needed for hand placement on rollator to best use brakes as needed and for proximity to rollator with gait.    Ambulation Distance (Feet) 230 Feet   x1, 500 x1   Assistive device Rollator    Gait Pattern Step-through pattern;Decreased arm swing - left;Ataxic;Wide base of support    Ambulation Surface Level;Indoor;Unlevel;Outdoor;Paved    Gait velocity 15.78 sec's= 2.08 ft/sec with rollator  PT Short Term Goals - 02/22/21 4098       PT SHORT TERM GOAL #1   Title Pt will perform HEP with family supervision for strength, balance, gait for improved mobility.  TARGET 02/17/2021    Baseline 02/22/21: met with current HEP    Status Achieved      PT SHORT TERM GOAL #2   Title Pt will perform 5x sit<>stand in less than or equal to 13 seconds for improved functional lower extremity strength.    Baseline 02/22/21: 12.82 sec's no UE support from standard height surface    Time --    Period --    Status Achieved      PT SHORT TERM GOAL #3   Title Pt will demonstrate ability to safey ambulate 100-200 ft using rollator device with supervision, for improved safety with gait.    Baseline 02/22/21: 230 feet with rollator indoors with supervision    Time --    Period --    Status Achieved               PT Long Term Goals - 02/22/21 1921       PT LONG TERM GOAL #1    Title Pt will perform progression of HEP for improved strength, transfers, balance, gait.  TARGET 04/21/2021    Baseline 02/22/21: had updated HEP from clinic, will benefit from further updating as pt progresses    Time 12    Period Weeks    Status On-going      PT LONG TERM GOAL #2   Title Pt will improve 5x sit<>stand to less than or equal to 11.5 sec to demonstrate improved functional strength and transfer efficiency.    Baseline 02/22/21: 12.82 sec's no UE support from standard height surface    Time 12    Period Weeks    Status On-going      PT LONG TERM GOAL #3   Title Pt will improve TUG score to less than or equal to 30 sec for decreased fall risk.    Baseline 02/22/21: 34.96 sec's with rollator, improved from 44.31 sec's on eval    Time 12    Period Weeks    Status On-going      PT LONG TERM GOAL #4   Title Pt will improve gait velocity to at least 1.8 ft/sec with appropriate assistive device, for improved gait efficiency and safety.    Baseline 02/22/21: 2.08 ft/sec's with rollator    Status Achieved      PT LONG TERM GOAL #5   Title Pt will verbalize understanding of fall prevention in home environment.    Baseline hx of 3 falls in past 6 months    Time 12    Period Weeks    Status On-going                   Plan - 02/22/21 0808     Clinical Impression Statement Today's skilled session focused on progress toward STGs with all goals met. Pt has also shown progress toward LTGs with use of rollator. The pt is making progress and should benefit from continued PT to progress toward unmet LTGs by continuing to address strengthening, balance and gait training.    Personal Factors and Comorbidities Comorbidity 2    Comorbidities PMH:  anxiety, seizure    Examination-Activity Limitations Locomotion Level;Transfers;Stairs;Stand    Examination-Participation Restrictions Community Activity;Shop    Stability/Clinical Decision Making Evolving/Moderate complexity    Rehab  Potential Good  PT Frequency Other (comment)   1x/wk for 3 weeks, then 2x/wk for 8 weeks   PT Duration Other (comment)   11 weeks total POC, 12 wk POC written to capture weeks authorizing Medicaid   PT Treatment/Interventions ADLs/Self Care Home Management;DME Instruction;Neuromuscular re-education;Balance training;Therapeutic exercise;Therapeutic activities;Functional mobility training;Stair training;Gait training;Patient/family education;Aquatic Therapy    PT Next Visit Plan Add to HEP as appropriate; continue to work on lower extremity/trunk stability, balance on solid, working towards compliant surfaces; gait training with rollator - pt/mom hoping to get hers before her next appt    Consulted and Agree with Plan of Care Patient;Family member/caregiver    Family Member Consulted mother-at end of session             Patient will benefit from skilled therapeutic intervention in order to improve the following deficits and impairments:  Abnormal gait, Difficulty walking, Impaired tone, Decreased balance, Decreased mobility, Decreased strength, Postural dysfunction  Visit Diagnosis: Muscle weakness (generalized)  Unsteadiness on feet  Other abnormalities of gait and mobility     Problem List Patient Active Problem List   Diagnosis Date Noted   Partial epilepsy with impairment of consciousness ([place]) 12/03/2012   Abnormality of gait 12/03/2012   Essential and other specified forms of tremor 12/03/2012   H/O mental disorder 05/15/2011   Cerebral palsy ([place]) 05/02/2011   Benign essential tremor 05/02/2011   Bilateral polycystic ovarian syndrome 02/28/2011    Willow Ora, PTA, [facility] Outpatient Neuro [facility] [address], [place], [place] [address] [phone] 02/22/21, 7:33 PM   Name: Shannon Travis MRN: [id number] Date of Birth: 1985/12/07

## 2021-02-23 ENCOUNTER — Ambulatory Visit: Payer: Medicaid Other | Admitting: Physical Therapy

## 2021-02-28 ENCOUNTER — Other Ambulatory Visit: Payer: Self-pay

## 2021-02-28 ENCOUNTER — Encounter: Payer: Self-pay | Admitting: Physical Therapy

## 2021-02-28 ENCOUNTER — Ambulatory Visit: Payer: Medicaid Other | Admitting: Physical Therapy

## 2021-02-28 DIAGNOSIS — M6281 Muscle weakness (generalized): Secondary | ICD-10-CM

## 2021-02-28 DIAGNOSIS — R2681 Unsteadiness on feet: Secondary | ICD-10-CM

## 2021-02-28 DIAGNOSIS — R2689 Other abnormalities of gait and mobility: Secondary | ICD-10-CM

## 2021-02-28 NOTE — Patient Instructions (Signed)
Access Code: [email or web address] URL: [email or web address] Date: 02/28/2021 Prepared by: Lonia Blood  Exercises Standing Hip Abduction with Counter Support - 1 x daily - 5 x weekly - 3 sets - 10 reps Standing Hip Extension with Counter Support - 1 x daily - 5 x weekly - 3 sets - 10 reps Standing Knee Flexion with Counter Support - 1 x daily - 5 x weekly - 3 sets - 10 reps Mini Squat with Counter Support - 1 x daily - 5 x weekly - 2 sets - 10 reps

## 2021-02-28 NOTE — Therapy (Signed)
[facility] [address] [place], [name], [address] Phone: [phone]   Fax:  [phone]  Physical Therapy Treatment  Patient Details  Name: Shannon Travis MRN: [id number] Date of Birth: 1985/12/30 Referring Provider (PT): Margette Fast   Encounter Date: 02/28/2021   PT End of Session - 02/28/21 0715     Visit Number 5    Number of Visits 20    Date for PT Re-Evaluation 04/14/21    Authorization Type Medicaid    Authorization Time Period 3 visits 02/13/21- 03/05/21;    Authorization - Visit Number 3    Authorization - Number of Visits 3    PT Start Time [phone]    PT Stop Time 0801    PT Time Calculation (min) 43 min    Equipment Utilized During Treatment Gait belt    Activity Tolerance Patient tolerated treatment well    Behavior During Therapy WFL for tasks assessed/performed             Past Medical History:  Diagnosis Date   Abnormality of gait    Movement disorder    Seizures ([place])     Past Surgical History:  Procedure Laterality Date   CYSTECTOMY Left    leg    There were no vitals filed for this visit.   Subjective Assessment - 02/28/21 0715     Subjective Nothing new.  Haven't gotten the rollator yet.  No falls.    Patient is accompained by: Family member   mom in lobby   Pertinent History PMH:  anxiety, seizure    How long can you stand comfortably? depends on situation; a few minutes    How long can you walk comfortably? depends, more difficulty outdoor /unlevel surfaces, thresholds    Patient Stated Goals Pt's goals for therapy are to focus on legs-get them better and stronger, help with balance, stop falling.    Currently in Pain? No/denies                               [facility] Adult PT Treatment/Exercise - 02/28/21 0001       Ambulation/Gait   Ambulation/Gait Yes    Ambulation/Gait Assistance 5: Supervision    Ambulation/Gait Assistance Details Initial  cues for keeping rollator close to body and for use of brakes for curbs/declines    Ambulation Distance (Feet) 800 Feet    Assistive device Rollator    Gait Pattern Step-through pattern;Decreased arm swing - left;Ataxic;Wide base of support    Ambulation Surface Level;Unlevel;Outdoor    Curb 5: Supervision    Curb Details (indicate cue type and reason) With rollator, cues for use of brake and sequence; performed x 2.      Exercises   Exercises Knee/Hip      Knee/Hip Exercises: Aerobic   Stepper SciFit Seated Stepper, Level 1.5, 4 extremities x 4 minutes, with pt keeping steps/min >100, BLEs only last 30 seconds.      Knee/Hip Exercises: Standing   Heel Raises Both;2 sets;10 reps    Heel Raises Limitations and toe raises    Knee Flexion Strengthening;Right;Left;1 set;10 reps    Hip Abduction Stengthening;Right;Left;1 set;10 reps;Knee straight    Hip Extension Stengthening;Right;Left;1 set;10 reps;Knee straight    Functional Squat 2 sets;10 reps;Limitations    Functional Squat Limitations 2nd set with buttocks touching stool behind    SLS with Vectors Alt step taps to 4" cabinet shelf, x 10  reps, 2 sets.    Other Standing Knee Exercises Tandem stance, 2 reps x 10 seconds each leg.                    PT Education - 02/28/21 0850     Education Details HEP additions-see instructions    Person(s) Educated Patient    Methods Explanation;Demonstration;Handout    Comprehension Verbalized understanding;Returned demonstration              PT Short Term Goals - 02/22/21 0812       PT SHORT TERM GOAL #1   Title Pt will perform HEP with family supervision for strength, balance, gait for improved mobility.  TARGET 02/17/2021    Baseline 02/22/21: met with current HEP    Status Achieved      PT SHORT TERM GOAL #2   Title Pt will perform 5x sit<>stand in less than or equal to 13 seconds for improved functional lower extremity strength.    Baseline 02/22/21: 12.82 sec's no UE  support from standard height surface    Time --    Period --    Status Achieved      PT SHORT TERM GOAL #3   Title Pt will demonstrate ability to safey ambulate 100-200 ft using rollator device with supervision, for improved safety with gait.    Baseline 02/22/21: 230 feet with rollator indoors with supervision    Time --    Period --    Status Achieved               PT Long Term Goals - 02/22/21 1921       PT LONG TERM GOAL #1   Title Pt will perform progression of HEP for improved strength, transfers, balance, gait.  TARGET 04/21/2021    Baseline 02/22/21: had updated HEP from clinic, will benefit from further updating as pt progresses    Time 12    Period Weeks    Status On-going      PT LONG TERM GOAL #2   Title Pt will improve 5x sit<>stand to less than or equal to 11.5 sec to demonstrate improved functional strength and transfer efficiency.    Baseline 02/22/21: 12.82 sec's no UE support from standard height surface    Time 12    Period Weeks    Status On-going      PT LONG TERM GOAL #3   Title Pt will improve TUG score to less than or equal to 30 sec for decreased fall risk.    Baseline 02/22/21: 34.96 sec's with rollator, improved from 44.31 sec's on eval    Time 12    Period Weeks    Status On-going      PT LONG TERM GOAL #4   Title Pt will improve gait velocity to at least 1.8 ft/sec with appropriate assistive device, for improved gait efficiency and safety.    Baseline 02/22/21: 2.08 ft/sec's with rollator    Status Achieved      PT LONG TERM GOAL #5   Title Pt will verbalize understanding of fall prevention in home environment.    Baseline hx of 3 falls in past 6 months    Time 12    Period Weeks    Status On-going                   Plan - 02/28/21 0850     Clinical Impression Statement Skilled PT session today focused on additional gait training on outdoor surfaces,  including sidewalks and curbs with use of (clinic) rollator.  Updated HEP to  focus on lower extremity strengthening.  Pt will continue to benefit from skilled PT to address strength, balance and gait towards LTGs.    Personal Factors and Comorbidities Comorbidity 2    Comorbidities PMH:  anxiety, seizure    Examination-Activity Limitations Locomotion Level;Transfers;Stairs;Stand    Examination-Participation Restrictions Community Activity;Shop    Stability/Clinical Decision Making Evolving/Moderate complexity    Rehab Potential Good    PT Frequency Other (comment)   1x/wk for 3 weeks, then 2x/wk for 8 weeks   PT Duration Other (comment)   11 weeks total POC, 12 wk POC written to capture weeks authorizing Medicaid   PT Treatment/Interventions ADLs/Self Care Home Management;DME Instruction;Neuromuscular re-education;Balance training;Therapeutic exercise;Therapeutic activities;Functional mobility training;Stair training;Gait training;Patient/family education;Aquatic Therapy    PT Next Visit Plan Review HEP; continue to work on lower extremity/trunk stability, balance on solid and compliant surfaces; gait training with rollator -check to see if pt has gotten hers yet    Consulted and Agree with Plan of Care Patient             Patient will benefit from skilled therapeutic intervention in order to improve the following deficits and impairments:  Abnormal gait, Difficulty walking, Impaired tone, Decreased balance, Decreased mobility, Decreased strength, Postural dysfunction  Visit Diagnosis: Other abnormalities of gait and mobility  Muscle weakness (generalized)  Unsteadiness on feet     Problem List Patient Active Problem List   Diagnosis Date Noted   Partial epilepsy with impairment of consciousness ([place]) 12/03/2012   Abnormality of gait 12/03/2012   Essential and other specified forms of tremor 12/03/2012   H/O mental disorder 05/15/2011   Cerebral palsy ([place]) 05/02/2011   Benign essential tremor 05/02/2011   Bilateral polycystic ovarian syndrome  02/28/2011    Payton Moder W. 02/28/2021, 8:54 AM Frazier Butt., PT   [facility] [address] [place], [name], [address] Phone: [phone]   Fax:  [phone]  Name: Shannon Travis MRN: [id number] Date of Birth: Feb 12, 1986

## 2021-03-14 ENCOUNTER — Ambulatory Visit: Payer: Medicaid Other | Admitting: Physical Therapy

## 2021-03-14 ENCOUNTER — Encounter: Payer: Self-pay | Admitting: Physical Therapy

## 2021-03-14 ENCOUNTER — Other Ambulatory Visit: Payer: Self-pay

## 2021-03-14 ENCOUNTER — Telehealth: Payer: Self-pay | Admitting: Neurology

## 2021-03-14 DIAGNOSIS — R269 Unspecified abnormalities of gait and mobility: Secondary | ICD-10-CM

## 2021-03-14 DIAGNOSIS — R2689 Other abnormalities of gait and mobility: Secondary | ICD-10-CM

## 2021-03-14 DIAGNOSIS — R2681 Unsteadiness on feet: Secondary | ICD-10-CM

## 2021-03-14 DIAGNOSIS — R293 Abnormal posture: Secondary | ICD-10-CM

## 2021-03-14 DIAGNOSIS — G801 Spastic diplegic cerebral palsy: Secondary | ICD-10-CM

## 2021-03-14 DIAGNOSIS — M6281 Muscle weakness (generalized): Secondary | ICD-10-CM

## 2021-03-14 NOTE — Therapy (Signed)
[facility] [address] [place], [name], [address] Phone: [phone]   Fax:  [phone]  Physical Therapy Treatment  Patient Details  Name: Shannon Travis MRN: [id number] Date of Birth: [birth year] Referring Provider (PT): Margette Fast   Encounter Date: 03/14/2021   PT End of Session - 03/14/21 0722     Visit Number 6    Number of Visits 20    Date for PT Re-Evaluation 04/14/21    Authorization Type Medicaid    Authorization Time Period 3 visits 02/13/21- 03/05/21; 12 03/14/21-04/24/21    Authorization - Visit Number 1    Authorization - Number of Visits 12    PT Start Time 0720    PT Stop Time 0800    PT Time Calculation (min) 40 min    Equipment Utilized During Treatment Gait belt    Activity Tolerance Patient tolerated treatment well    Behavior During Therapy WFL for tasks assessed/performed             Past Medical History:  Diagnosis Date   Abnormality of gait    Movement disorder    Seizures ([place])     Past Surgical History:  Procedure Laterality Date   CYSTECTOMY Left    leg    There were no vitals filed for this visit.   Subjective Assessment - 03/14/21 0721     Subjective No new complaints. No falls or pain to report. 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 Needs a larger size with larger seat. Mom is planning to go to MD office to get order updated to state larger rollator. Reports no issues with updated HEP.    Patient is accompained by: Family member   mom  in lobby   Pertinent History PMH:  anxiety, seizure    How long can you stand comfortably? depends on situation; a few minutes    How long can you walk comfortably? depends, more difficulty outdoor /unlevel surfaces, thresholds    Patient Stated Goals Pt's goals for therapy are to focus on legs-get them better and stronger, help with balance, stop falling.    Currently in Pain? No/denies                    [facility] Adult  PT Treatment/Exercise - 03/14/21 0723       Transfers   Transfers Sit to Stand;Stand to Sit    Sit to Stand 5: Supervision;Without upper extremity assist;From chair/3-in-1;From bed    Stand to Sit 5: Supervision;Without upper extremity assist;To bed;To chair/3-in-1      Ambulation/Gait   Ambulation/Gait Yes    Ambulation/Gait Assistance 5: Supervision    Ambulation/Gait Assistance Details use of cane to enter/exit session. continued with use of rollator in session with occasional reminder cues for hand placement on brakes of rollator.    Ambulation Distance (Feet) 1000 Feet   x1, plus around clinic with session   Assistive device Rollator;Straight cane    Gait Pattern Step-through pattern;Decreased arm swing - left;Ataxic;Wide base of support    Ambulation Surface Level;Indoor;Unlevel;Outdoor;Paved      Knee/Hip Exercises: Aerobic   Other Aerobic Scifit UE/LE's level 2.0  x 6 mintues with goal >/= 70 steps per minute for strengthening and activity tolerance.                 Balance Exercises - 03/14/21 0746       Balance Exercises: Standing   Standing Eyes Opened Wide (BOA);Foam/compliant surface;Other reps (comment);Limitations  Standing Eyes Opened Limitations on airex with feet apart EO for head movements left<>right, then up<>down for 10 reps each. min guard assist for safety with cues for posture and weight shifting for balance assistance.    Standing Eyes Closed Foam/compliant surface;Wide (BOA);3 reps;30 secs;Limitations    Standing Eyes Closed Limitations on airex with feet apart for EC with min guard to min assist for balance. cues on posture and weight shifting to assist with balance recovery.                 PT Short Term Goals - 02/22/21 1683       PT SHORT TERM GOAL #1   Title Pt will perform HEP with family supervision for strength, balance, gait for improved mobility.  TARGET 02/17/2021    Baseline 02/22/21: met with current HEP    Status Achieved       PT SHORT TERM GOAL #2   Title Pt will perform 5x sit<>stand in less than or equal to 13 seconds for improved functional lower extremity strength.    Baseline 02/22/21: 12.82 sec's no UE support from standard height surface    Time --    Period --    Status Achieved      PT SHORT TERM GOAL #3   Title Pt will demonstrate ability to safey ambulate 100-200 ft using rollator device with supervision, for improved safety with gait.    Baseline 02/22/21: 230 feet with rollator indoors with supervision    Time --    Period --    Status Achieved               PT Long Term Goals - 02/22/21 1921       PT LONG TERM GOAL #1   Title Pt will perform progression of HEP for improved strength, transfers, balance, gait.  TARGET 04/21/2021    Baseline 02/22/21: had updated HEP from clinic, will benefit from further updating as pt progresses    Time 12    Period Weeks    Status On-going      PT LONG TERM GOAL #2   Title Pt will improve 5x sit<>stand to less than or equal to 11.5 sec to demonstrate improved functional strength and transfer efficiency.    Baseline 02/22/21: 12.82 sec's no UE support from standard height surface    Time 12    Period Weeks    Status On-going      PT LONG TERM GOAL #3   Title Pt will improve TUG score to less than or equal to 30 sec for decreased fall risk.    Baseline 02/22/21: 34.96 sec's with rollator, improved from 44.31 sec's on eval    Time 12    Period Weeks    Status On-going      PT LONG TERM GOAL #4   Title Pt will improve gait velocity to at least 1.8 ft/sec with appropriate assistive device, for improved gait efficiency and safety.    Baseline 02/22/21: 2.08 ft/sec's with rollator    Status Achieved      PT LONG TERM GOAL #5   Title Pt will verbalize understanding of fall prevention in home environment.    Baseline hx of 3 falls in past 6 months    Time 12    Period Weeks    Status On-going                   Plan - 03/14/21 0723      Clinical Impression  Statement Today's skilled session continued to focus on use of rollator with gait, LE strengthening and began to work on balance on compliant surfaces with no issues noted or reported in session. The pt is making steady progress toward goals and should benefit from continued PT to progress toward unmet goals.    Personal Factors and Comorbidities Comorbidity 2    Comorbidities PMH:  anxiety, seizure    Examination-Activity Limitations Locomotion Level;Transfers;Stairs;Stand    Examination-Participation Restrictions Community Activity;Shop    Stability/Clinical Decision Making Evolving/Moderate complexity    Rehab Potential Good    PT Frequency Other (comment)   1x/wk for 3 weeks, then 2x/wk for 8 weeks   PT Duration Other (comment)   11 weeks total POC, 12 wk POC written to capture weeks authorizing Medicaid   PT Treatment/Interventions ADLs/Self Care Home Management;DME Instruction;Neuromuscular re-education;Balance training;Therapeutic exercise;Therapeutic activities;Functional mobility training;Stair training;Gait training;Patient/family education;Aquatic Therapy    PT Next Visit Plan continue to work on lower extremity/trunk stability, balance on solid and compliant surfaces; gait training with rollator -check to see if pt has gotten hers yet    Consulted and Agree with Plan of Care Patient             Patient will benefit from skilled therapeutic intervention in order to improve the following deficits and impairments:  Abnormal gait, Difficulty walking, Impaired tone, Decreased balance, Decreased mobility, Decreased strength, Postural dysfunction  Visit Diagnosis: Other abnormalities of gait and mobility  Muscle weakness (generalized)  Unsteadiness on feet  Abnormal posture     Problem List Patient Active Problem List   Diagnosis Date Noted   Partial epilepsy with impairment of consciousness ([place]) 12/03/2012   Abnormality of gait 12/03/2012    Essential and other specified forms of tremor 12/03/2012   H/O mental disorder 05/15/2011   Cerebral palsy ([place]) 05/02/2011   Benign essential tremor 05/02/2011   Bilateral polycystic ovarian syndrome 02/28/2011    Willow Ora, PTA, [facility] Outpatient Neuro [facility] [address] [place], [place] [address] [phone] 03/14/21, 8:59 AM   Name: NANDITA MATHENIA MRN: [id number] Date of Birth: 1986-01-13

## 2021-03-14 NOTE — Telephone Encounter (Signed)
Patient's mother stopped by the lobby this morning requesting a new Rx for walker. It needs to be DX code and also bariatric. Best call back is [phone]

## 2021-03-14 NOTE — Telephone Encounter (Signed)
I talked to the mother.  The patient has cerebral palsy with a gait disorder, she does need a walker but she weighs over 300 pounds and will require a bariatric walker strong enough to support her.  I will write a prescription for a walker.

## 2021-03-16 ENCOUNTER — Encounter: Payer: Self-pay | Admitting: Physical Therapy

## 2021-03-16 ENCOUNTER — Other Ambulatory Visit: Payer: Self-pay

## 2021-03-16 ENCOUNTER — Ambulatory Visit: Payer: Medicaid Other | Attending: Neurology | Admitting: Physical Therapy

## 2021-03-16 DIAGNOSIS — M6281 Muscle weakness (generalized): Secondary | ICD-10-CM | POA: Diagnosis present

## 2021-03-16 DIAGNOSIS — R2681 Unsteadiness on feet: Secondary | ICD-10-CM | POA: Insufficient documentation

## 2021-03-16 DIAGNOSIS — R2689 Other abnormalities of gait and mobility: Secondary | ICD-10-CM | POA: Diagnosis present

## 2021-03-16 DIAGNOSIS — R293 Abnormal posture: Secondary | ICD-10-CM | POA: Diagnosis present

## 2021-03-16 NOTE — Therapy (Signed)
[facility] [address] [place], [name], [address] Phone: [phone]   Fax:  [phone]  Physical Therapy Treatment  Patient Details  Name: Shannon Travis MRN: [id number] Date of Birth: 10-18-1985 Referring Provider (PT): Margette Fast   Encounter Date: 03/16/2021   PT End of Session - 03/16/21 0723     Visit Number 7    Number of Visits 20    Date for PT Re-Evaluation 04/14/21    Authorization Type Medicaid    Authorization Time Period 3 visits 02/13/21- 03/05/21; 12 03/14/21-04/24/21    Authorization - Visit Number 2    Authorization - Number of Visits 12    PT Start Time 0718    PT Stop Time 0800    PT Time Calculation (min) 42 min    Equipment Utilized During Treatment Gait belt    Activity Tolerance Patient tolerated treatment well    Behavior During Therapy WFL for tasks assessed/performed             Past Medical History:  Diagnosis Date   Abnormality of gait    Movement disorder    Seizures ([place])     Past Surgical History:  Procedure Laterality Date   CYSTECTOMY Left    leg    There were no vitals filed for this visit.   Subjective Assessment - 03/16/21 0721     Subjective No new complaitns. No falls. Some pain in back of thighs today, pt thinks it was from how she slept last night. Mom has gotten updated rollator script for Health visitor. Now just trying to find time to go get it.    Patient is accompained by: Family member   mom in lobby   Pertinent History PMH:  anxiety, seizure    How long can you walk comfortably? depends, more difficulty outdoor /unlevel surfaces, thresholds    Patient Stated Goals Pt's goals for therapy are to focus on legs-get them better and stronger, help with balance, stop falling.    Currently in Pain? No/denies                     [facility] Adult PT Treatment/Exercise - 03/16/21 0724       Transfers   Transfers Sit to Stand;Stand to  Sit    Sit to Stand 5: Supervision;Without upper extremity assist;From chair/3-in-1;From bed    Stand to Sit 5: Supervision;Without upper extremity assist;To bed;To chair/3-in-1      Ambulation/Gait   Ambulation/Gait Yes    Ambulation/Gait Assistance 5: Supervision    Ambulation/Gait Assistance Details reminder cues to stay closer to rollator, especially with down slopes/ramps. pt needing to stop every so often to reposition her hands on the handles/brakes of the rollator.    Ambulation Distance (Feet) 1000 Feet   x1, plus around clinic with session   Assistive device Rollator;Straight cane    Gait Pattern Step-through pattern;Decreased arm swing - left;Ataxic;Wide base of support    Ambulation Surface Level;Indoor                 Balance Exercises - 03/16/21 0740       Balance Exercises: Standing   SLS with Vectors Solid surface;Upper extremity assist 1;Other reps (comment);Limitations    SLS with Vectors Limitations on solid floor with 2 foam bubbles: alternating forward light foot taps for ~8 reps each side with min HHA. cues for wide base of support and weight shifting.    Rockerboard Anterior/posterior;EO;EC;30 seconds;Other reps (comment);Limitations;UE support  Rockerboard Limitations on balance board in anterior/posterior directions: with light UE support progressing to no UE support for rocking the board with EO, emphasis on tall posture and weight shfiting. then holding the board steady: no UE support for 30 seconds with EO for 3 reps, then with light single UE support for EC 30 sec's x 3 reps. min guard to min assist for balance. cues on posture/weight shifitng to assist with balance.                 PT Short Term Goals - 02/22/21 0812       PT SHORT TERM GOAL #1   Title Pt will perform HEP with family supervision for strength, balance, gait for improved mobility.  TARGET 02/17/2021    Baseline 02/22/21: met with current HEP    Status Achieved      PT SHORT  TERM GOAL #2   Title Pt will perform 5x sit<>stand in less than or equal to 13 seconds for improved functional lower extremity strength.    Baseline 02/22/21: 12.82 sec's no UE support from standard height surface    Time --    Period --    Status Achieved      PT SHORT TERM GOAL #3   Title Pt will demonstrate ability to safey ambulate 100-200 ft using rollator device with supervision, for improved safety with gait.    Baseline 02/22/21: 230 feet with rollator indoors with supervision    Time --    Period --    Status Achieved               PT Long Term Goals - 02/22/21 1921       PT LONG TERM GOAL #1   Title Pt will perform progression of HEP for improved strength, transfers, balance, gait.  TARGET 04/21/2021    Baseline 02/22/21: had updated HEP from clinic, will benefit from further updating as pt progresses    Time 12    Period Weeks    Status On-going      PT LONG TERM GOAL #2   Title Pt will improve 5x sit<>stand to less than or equal to 11.5 sec to demonstrate improved functional strength and transfer efficiency.    Baseline 02/22/21: 12.82 sec's no UE support from standard height surface    Time 12    Period Weeks    Status On-going      PT LONG TERM GOAL #3   Title Pt will improve TUG score to less than or equal to 30 sec for decreased fall risk.    Baseline 02/22/21: 34.96 sec's with rollator, improved from 44.31 sec's on eval    Time 12    Period Weeks    Status On-going      PT LONG TERM GOAL #4   Title Pt will improve gait velocity to at least 1.8 ft/sec with appropriate assistive device, for improved gait efficiency and safety.    Baseline 02/22/21: 2.08 ft/sec's with rollator    Status Achieved      PT LONG TERM GOAL #5   Title Pt will verbalize understanding of fall prevention in home environment.    Baseline hx of 3 falls in past 6 months    Time 12    Period Weeks    Status On-going                   Plan - 03/16/21 0723     Clinical  Impression Statement Today's skilled session continued   to focus on use of rollator on various surfaces. Pt needing to stop a few times with gait to re-position her hands correctly on handles/brakes of rollator. Remainder of session continued to focus on balance training with up to min assist needed, no issues noted or reported by patient. The pt is progressing toward goals and should benefit from continued PT to progress toward unmet goals.    Personal Factors and Comorbidities Comorbidity 2    Comorbidities PMH:  anxiety, seizure    Examination-Activity Limitations Locomotion Level;Transfers;Stairs;Stand    Examination-Participation Restrictions Community Activity;Shop    Stability/Clinical Decision Making Evolving/Moderate complexity    Rehab Potential Good    PT Frequency Other (comment)   1x/wk for 3 weeks, then 2x/wk for 8 weeks   PT Duration Other (comment)   11 weeks total POC, 12 wk POC written to capture weeks authorizing Medicaid   PT Treatment/Interventions ADLs/Self Care Home Management;DME Instruction;Neuromuscular re-education;Balance training;Therapeutic exercise;Therapeutic activities;Functional mobility training;Stair training;Gait training;Patient/family education;Aquatic Therapy    PT Next Visit Plan continue to work on lower extremity/trunk stability, balance on solid and compliant surfaces; continue with use of clinic rolllator until pt is able to get hers    Consulted and Agree with Plan of Care Patient             Patient will benefit from skilled therapeutic intervention in order to improve the following deficits and impairments:  Abnormal gait, Difficulty walking, Impaired tone, Decreased balance, Decreased mobility, Decreased strength, Postural dysfunction  Visit Diagnosis: Other abnormalities of gait and mobility  Muscle weakness (generalized)  Unsteadiness on feet     Problem List Patient Active Problem List   Diagnosis Date Noted   Partial epilepsy with  impairment of consciousness ([place]) 12/03/2012   Abnormality of gait 12/03/2012   Essential and other specified forms of tremor 12/03/2012   H/O mental disorder 05/15/2011   Cerebral palsy ([place]) 05/02/2011   Benign essential tremor 05/02/2011   Bilateral polycystic ovarian syndrome 02/28/2011    Willow Ora, PTA, [facility] Outpatient Neuro [facility] [address] [place], [place] [address] [phone] 03/16/21, 8:39 AM   Name: Shannon Travis MRN: [id number] Date of Birth: 1985-08-29

## 2021-03-17 ENCOUNTER — Ambulatory Visit: Payer: Medicaid Other | Admitting: Physical Therapy

## 2021-03-21 ENCOUNTER — Ambulatory Visit: Payer: Medicaid Other | Admitting: Physical Therapy

## 2021-03-22 ENCOUNTER — Ambulatory Visit: Payer: Medicaid Other | Admitting: Physical Therapy

## 2021-03-24 ENCOUNTER — Ambulatory Visit: Payer: Medicaid Other | Admitting: Physical Therapy

## 2021-03-28 ENCOUNTER — Encounter: Payer: Self-pay | Admitting: Physical Therapy

## 2021-03-28 ENCOUNTER — Other Ambulatory Visit: Payer: Self-pay

## 2021-03-28 ENCOUNTER — Ambulatory Visit: Payer: Medicaid Other | Admitting: Physical Therapy

## 2021-03-28 DIAGNOSIS — M6281 Muscle weakness (generalized): Secondary | ICD-10-CM

## 2021-03-28 DIAGNOSIS — R2689 Other abnormalities of gait and mobility: Secondary | ICD-10-CM

## 2021-03-28 DIAGNOSIS — R2681 Unsteadiness on feet: Secondary | ICD-10-CM

## 2021-03-28 NOTE — Therapy (Signed)
[facility] [address] [place], [name], [address] Phone: [phone]   Fax:  [phone]  Physical Therapy Treatment  Patient Details  Name: Shannon Travis MRN: [id number] Date of Birth: 1986-06-05 Referring Provider (PT): Margette Fast   Encounter Date: 03/28/2021   PT End of Session - 03/28/21 0725     Visit Number 8    Number of Visits 20    Date for PT Re-Evaluation 04/14/21    Authorization Type Medicaid    Authorization Time Period 3 visits 02/13/21- 03/05/21; 12 03/14/21-04/24/21    Authorization - Visit Number 3    Authorization - Number of Visits 12    PT Start Time 0718    PT Stop Time 0800    PT Time Calculation (min) 42 min    Equipment Utilized During Treatment Gait belt    Activity Tolerance Patient tolerated treatment well    Behavior During Therapy WFL for tasks assessed/performed             Past Medical History:  Diagnosis Date   Abnormality of gait    Movement disorder    Seizures ([place])     Past Surgical History:  Procedure Laterality Date   CYSTECTOMY Left    leg    There were no vitals filed for this visit.   Subjective Assessment - 03/28/21 0723     Subjective No falls. Continues to work on Economist- has called her insurance which did confirm it would be covered. Now looking for a place that has a larger one that will file the insurance for her.    Pertinent History PMH:  anxiety, seizure    How long can you stand comfortably? depends on situation; a few minutes    How long can you walk comfortably? depends, more difficulty outdoor /unlevel surfaces, thresholds    Patient Stated Goals Pt's goals for therapy are to focus on legs-get them better and stronger, help with balance, stop falling.    Currently in Pain? No/denies                   [facility] Adult PT Treatment/Exercise - 03/28/21 0726       Transfers   Transfers Sit to Stand;Stand to Sit;Stand  Pivot Transfers    Sit to Stand 5: Supervision;Without upper extremity assist;From chair/3-in-1;From bed    Stand to Sit 5: Supervision;Without upper extremity assist;To bed;To chair/3-in-1;4: Min guard;4: Min Social research officer, government Transfers 5: Supervision    Stand Pivot Transfer Details (indicate cue type and reason) supervision to min guard assist for pt to turn in rollator to sit on seat, then turn in rollator to continue gait after resting x 2 reps with cues on tecnhnique and hand placement on handles.      Ambulation/Gait   Ambulation/Gait Yes    Ambulation/Gait Assistance 5: Supervision;4: Min guard;4: Min assist    Ambulation/Gait Assistance Details continued with use of rollator on various surfaces. Pt initally at supervision level, however increased assistance needed toward end of gait due to left LE instability. two rest breaks on rollator seat and one rest break at picnic bench during gait.    Ambulation Distance (Feet) 1000 Feet   total with 3 rest breaks needed   Assistive device Rollator;Straight cane    Gait Pattern Step-through pattern;Decreased arm swing - left;Ataxic;Wide base of support    Ambulation Surface Level;Indoor  Balance Exercises - 03/28/21 0757       Balance Exercises: Standing   SLS with Vectors Solid surface;Upper extremity assist 1;Other reps (comment);Limitations    SLS with Vectors Limitations on solid floor with 2 foam bubbles: alternating forward light foot taps for 10 reps each side with intermittent touch to stable surface for balance. cues for wide base of support and weight shifting.                  PT Short Term Goals - 02/22/21 7001       PT SHORT TERM GOAL #1   Title Pt will perform HEP with family supervision for strength, balance, gait for improved mobility.  TARGET 02/17/2021    Baseline 02/22/21: met with current HEP    Status Achieved      PT SHORT TERM GOAL #2   Title Pt will perform 5x sit<>stand in less  than or equal to 13 seconds for improved functional lower extremity strength.    Baseline 02/22/21: 12.82 sec's no UE support from standard height surface    Time --    Period --    Status Achieved      PT SHORT TERM GOAL #3   Title Pt will demonstrate ability to safey ambulate 100-200 ft using rollator device with supervision, for improved safety with gait.    Baseline 02/22/21: 230 feet with rollator indoors with supervision    Time --    Period --    Status Achieved               PT Long Term Goals - 02/22/21 1921       PT LONG TERM GOAL #1   Title Pt will perform progression of HEP for improved strength, transfers, balance, gait.  TARGET 04/21/2021    Baseline 02/22/21: had updated HEP from clinic, will benefit from further updating as pt progresses    Time 12    Period Weeks    Status On-going      PT LONG TERM GOAL #2   Title Pt will improve 5x sit<>stand to less than or equal to 11.5 sec to demonstrate improved functional strength and transfer efficiency.    Baseline 02/22/21: 12.82 sec's no UE support from standard height surface    Time 12    Period Weeks    Status On-going      PT LONG TERM GOAL #3   Title Pt will improve TUG score to less than or equal to 30 sec for decreased fall risk.    Baseline 02/22/21: 34.96 sec's with rollator, improved from 44.31 sec's on eval    Time 12    Period Weeks    Status On-going      PT LONG TERM GOAL #4   Title Pt will improve gait velocity to at least 1.8 ft/sec with appropriate assistive device, for improved gait efficiency and safety.    Baseline 02/22/21: 2.08 ft/sec's with rollator    Status Achieved      PT LONG TERM GOAL #5   Title Pt will verbalize understanding of fall prevention in home environment.    Baseline hx of 3 falls in past 6 months    Time 12    Period Weeks    Status On-going                   Plan - 03/28/21 0725     Clinical Impression Statement Today's skilled session continued to  focus on use of rollator  for gait on various surfaces with increased time needed this session (pt with much slower gait speed this date compared to last session with rollator use) and increased assistance for balance due left LE instability toward end of gait. Remainder of session focused on standing balance with decreased UE support with min guard to min assist needed. No other issues noted or reported in session. The pt is progressing toward goals and should benefit from continued PT to progress toward unmet goals.    Personal Factors and Comorbidities Comorbidity 2    Comorbidities PMH:  anxiety, seizure    Examination-Activity Limitations Locomotion Level;Transfers;Stairs;Stand    Examination-Participation Restrictions Community Activity;Shop    Stability/Clinical Decision Making Evolving/Moderate complexity    Rehab Potential Good    PT Frequency Other (comment)   1x/wk for 3 weeks, then 2x/wk for 8 weeks   PT Duration Other (comment)   11 weeks total POC, 12 wk POC written to capture weeks authorizing Medicaid   PT Treatment/Interventions ADLs/Self Care Home Management;DME Instruction;Neuromuscular re-education;Balance training;Therapeutic exercise;Therapeutic activities;Functional mobility training;Stair training;Gait training;Patient/family education;Aquatic Therapy    PT Next Visit Plan continue to work on lower extremity/trunk stability, balance on solid and compliant surfaces; continue with use of clinic rolllator until pt is able to get hers    Consulted and Agree with Plan of Care Patient             Patient will benefit from skilled therapeutic intervention in order to improve the following deficits and impairments:  Abnormal gait, Difficulty walking, Impaired tone, Decreased balance, Decreased mobility, Decreased strength, Postural dysfunction  Visit Diagnosis: Other abnormalities of gait and mobility  Muscle weakness (generalized)  Unsteadiness on feet     Problem  List Patient Active Problem List   Diagnosis Date Noted   Partial epilepsy with impairment of consciousness ([place]) 12/03/2012   Abnormality of gait 12/03/2012   Essential and other specified forms of tremor 12/03/2012   H/O mental disorder 05/15/2011   Cerebral palsy ([place]) 05/02/2011   Benign essential tremor 05/02/2011   Bilateral polycystic ovarian syndrome 02/28/2011    Willow Ora, PTA, [facility] Outpatient Neuro [facility] [address] [place], [place] [address] [phone] 03/28/21, 8:25 AM   Name: Shannon Travis MRN: [id number] Date of Birth: 1986-06-15

## 2021-03-31 ENCOUNTER — Ambulatory Visit: Payer: Medicaid Other | Admitting: Physical Therapy

## 2021-03-31 ENCOUNTER — Other Ambulatory Visit: Payer: Self-pay

## 2021-03-31 ENCOUNTER — Encounter: Payer: Self-pay | Admitting: Physical Therapy

## 2021-03-31 DIAGNOSIS — R2681 Unsteadiness on feet: Secondary | ICD-10-CM

## 2021-03-31 DIAGNOSIS — R2689 Other abnormalities of gait and mobility: Secondary | ICD-10-CM

## 2021-03-31 DIAGNOSIS — M6281 Muscle weakness (generalized): Secondary | ICD-10-CM

## 2021-03-31 NOTE — Therapy (Signed)
[facility] [address] [place], [name], [address] Phone: [phone]   Fax:  [phone]  Physical Therapy Treatment  Patient Details  Name: Shannon Travis MRN: [id number] Date of Birth: August 04, 1985 Referring Provider (PT): Margette Fast   Encounter Date: 03/31/2021   PT End of Session - 03/31/21 0755     Visit Number 9    Number of Visits 20    Date for PT Re-Evaluation 04/14/21    Authorization Type Medicaid    Authorization Time Period 3 visits 02/13/21- 03/05/21; 12 03/14/21-04/24/21    Authorization - Visit Number 4    Authorization - Number of Visits 12    PT Start Time 0718    PT Stop Time 0759    PT Time Calculation (min) 41 min    Equipment Utilized During Treatment --    Activity Tolerance Patient tolerated treatment well    Behavior During Therapy [facility] for tasks assessed/performed             Past Medical History:  Diagnosis Date   Abnormality of gait    Movement disorder    Seizures ([place])     Past Surgical History:  Procedure Laterality Date   CYSTECTOMY Left    leg    There were no vitals filed for this visit.   Subjective Assessment - 03/31/21 0717     Subjective Exercises going well. "I do have a favor to ask:  I want to work on the steps.  I have a fear with coming down."    Pertinent History PMH:  anxiety, seizure    How long can you stand comfortably? depends on situation; a few minutes    How long can you walk comfortably? depends, more difficulty outdoor /unlevel surfaces, thresholds    Patient Stated Goals Pt's goals for therapy are to focus on legs-get them better and stronger, help with balance, stop falling.    Currently in Pain? No/denies                               [facility] Adult PT Treatment/Exercise - 03/31/21 0001       Ambulation/Gait   Stairs Yes    Stairs Assistance 5: Supervision    Stairs Assistance Details (indicate cue type and  reason) 1st trial step through ascending and step to descending; 2nd trial step through ascending and descending with demonstrating increased time, difficulty with step through pattern, wtih RLE as the stance leg on the step coming down.    Stair Management Technique Two rails;Step to pattern;Alternating pattern;Forwards    Number of Stairs 4   x2   Height of Stairs 6      Exercises   Exercises Ankle      Knee/Hip Exercises: Aerobic   Stepper SciFit Seated Stepper, 4 extremities Level 2, 4 extremities x 4:30 minutes, with pt keeping steps/min >90, BLEs only last 1:30 seconds.      Knee/Hip Exercises: Standing   SLS SLS each leg, 3 reps 10 seconds BLE support    Other Standing Knee Exercises Sit to stand x 10 reps from mat surface, then sit to stand with RLE tucked posteriorly for increased weightbearing thorugh RLE.    Other Standing Knee Exercises Sit to stand straddled at edge of mat, RLE posterior, x 10 reps for increased weightbearing through RLE      Ankle Exercises: Standing   Heel Raises Both;10 reps  Toe Raise 10 reps    Other Standing Ankle Exercises Alt heel raises, 10 reps x 2 sets    Other Standing Ankle Exercises Stagger stance forward/back rocking at counter, with focus on push-off through ankles, 10 reps each foot position.                     PT Education - 03/31/21 0751     Education Details Additions to HEP    Person(s) Educated Patient    Methods Explanation;Demonstration;Handout    Comprehension Verbalized understanding;Returned demonstration              PT Short Term Goals - 02/22/21 0812       PT SHORT TERM GOAL #1   Title Pt will perform HEP with family supervision for strength, balance, gait for improved mobility.  TARGET 02/17/2021    Baseline 02/22/21: met with current HEP    Status Achieved      PT SHORT TERM GOAL #2   Title Pt will perform 5x sit<>stand in less than or equal to 13 seconds for improved functional lower extremity  strength.    Baseline 02/22/21: 12.82 sec's no UE support from standard height surface    Time --    Period --    Status Achieved      PT SHORT TERM GOAL #3   Title Pt will demonstrate ability to safey ambulate 100-200 ft using rollator device with supervision, for improved safety with gait.    Baseline 02/22/21: 230 feet with rollator indoors with supervision    Time --    Period --    Status Achieved               PT Long Term Goals - 02/22/21 1921       PT LONG TERM GOAL #1   Title Pt will perform progression of HEP for improved strength, transfers, balance, gait.  TARGET 04/21/2021    Baseline 02/22/21: had updated HEP from clinic, will benefit from further updating as pt progresses    Time 12    Period Weeks    Status On-going      PT LONG TERM GOAL #2   Title Pt will improve 5x sit<>stand to less than or equal to 11.5 sec to demonstrate improved functional strength and transfer efficiency.    Baseline 02/22/21: 12.82 sec's no UE support from standard height surface    Time 12    Period Weeks    Status On-going      PT LONG TERM GOAL #3   Title Pt will improve TUG score to less than or equal to 30 sec for decreased fall risk.    Baseline 02/22/21: 34.96 sec's with rollator, improved from 44.31 sec's on eval    Time 12    Period Weeks    Status On-going      PT LONG TERM GOAL #4   Title Pt will improve gait velocity to at least 1.8 ft/sec with appropriate assistive device, for improved gait efficiency and safety.    Baseline 02/22/21: 2.08 ft/sec's with rollator    Status Achieved      PT LONG TERM GOAL #5   Title Pt will verbalize understanding of fall prevention in home environment.    Baseline hx of 3 falls in past 6 months    Time 12    Period Weeks    Status On-going  Plan - 03/31/21 0800     Clinical Impression Statement Pt requests to focus on stair negotiation today, as she reports being fearful of descending steps.  With  attempt to descend steps with alternating pattern, she has increased difficulty and requires extra time when RLE is the stance leg on the step as she is trying to bring the LLE down.  Focused skilled PT session today on RLE strengthening to engage ankle, quad/hamstring musculature for improved stair negotiation.  Added to HEP to address.    Personal Factors and Comorbidities Comorbidity 2    Comorbidities PMH:  anxiety, seizure    Examination-Activity Limitations Locomotion Level;Transfers;Stairs;Stand    Examination-Participation Restrictions Community Activity;Shop    Stability/Clinical Decision Making Evolving/Moderate complexity    Rehab Potential Good    PT Frequency Other (comment)   1x/wk for 3 weeks, then 2x/wk for 8 weeks   PT Duration Other (comment)   11 weeks total POC, 12 wk POC written to capture weeks authorizing Medicaid   PT Treatment/Interventions ADLs/Self Care Home Management;DME Instruction;Neuromuscular re-education;Balance training;Therapeutic exercise;Therapeutic activities;Functional mobility training;Stair training;Gait training;Patient/family education;Aquatic Therapy    PT Next Visit Plan Review updates to HEP, stair negotiation, especially focus on descending steps with alternating pattern with improved confidence. Address goals as it has been >4 weeks since STGs assessed.  (ran out of time today)    Consulted and Agree with Plan of Care Patient             Patient will benefit from skilled therapeutic intervention in order to improve the following deficits and impairments:  Abnormal gait, Difficulty walking, Impaired tone, Decreased balance, Decreased mobility, Decreased strength, Postural dysfunction  Visit Diagnosis: Other abnormalities of gait and mobility  Muscle weakness (generalized)  Unsteadiness on feet     Problem List Patient Active Problem List   Diagnosis Date Noted   Partial epilepsy with impairment of consciousness ([place]) 12/03/2012    Abnormality of gait 12/03/2012   Essential and other specified forms of tremor 12/03/2012   H/O mental disorder 05/15/2011   Cerebral palsy ([place]) 05/02/2011   Benign essential tremor 05/02/2011   Bilateral polycystic ovarian syndrome 02/28/2011    Jhonnie Aliano W., PT 03/31/2021, 8:05 AM Frazier Butt., PT  [facility] [address] [place], [name], [address] Phone: [phone]   Fax:  [phone]  Name: Shannon Travis MRN: [id number] Date of Birth: 11-22-1985

## 2021-03-31 NOTE — Patient Instructions (Signed)
Access Code: [email or web address] URL: [email or web address] Date: 03/31/2021 Prepared by: Lonia Blood  Exercises Standing Hip Abduction with Counter Support - 1 x daily - 5 x weekly - 3 sets - 10 reps Standing Hip Extension with Counter Support - 1 x daily - 5 x weekly - 3 sets - 10 reps Standing Knee Flexion with Counter Support - 1 x daily - 5 x weekly - 3 sets - 10 reps Mini Squat with Counter Support - 1 x daily - 5 x weekly - 2 sets - 10 reps  Added 03/31/2021 Sit to stand in stride stance - 1-2 x daily - 5 x weekly - 1-2 sets - 10 reps Alternating Heel Raises - 1-2 x daily - 5 x weekly - 2 sets - 10 reps Staggered Stance Forward Backward Weight Shift with Counter Support - 1-2 x daily - 5 x weekly - 2 sets - 10 reps

## 2021-04-04 ENCOUNTER — Other Ambulatory Visit: Payer: Self-pay

## 2021-04-04 ENCOUNTER — Ambulatory Visit: Payer: Medicaid Other | Admitting: Physical Therapy

## 2021-04-04 ENCOUNTER — Encounter: Payer: Self-pay | Admitting: Physical Therapy

## 2021-04-04 DIAGNOSIS — R293 Abnormal posture: Secondary | ICD-10-CM

## 2021-04-04 DIAGNOSIS — M6281 Muscle weakness (generalized): Secondary | ICD-10-CM

## 2021-04-04 DIAGNOSIS — R2681 Unsteadiness on feet: Secondary | ICD-10-CM

## 2021-04-04 DIAGNOSIS — R2689 Other abnormalities of gait and mobility: Secondary | ICD-10-CM

## 2021-04-04 NOTE — Therapy (Signed)
[facility] [address] [place], [name], [address] Phone: [phone]   Fax:  [phone]  Physical Therapy Treatment  Patient Details  Name: Shannon Travis MRN: [id number] Date of Birth: 10-Dec-1985 Referring Provider (PT): Margette Fast   Encounter Date: 04/04/2021   PT End of Session - 04/04/21 0722     Visit Number 10    Number of Visits 20    Date for PT Re-Evaluation 04/14/21    Authorization Type Medicaid    Authorization Time Period 3 visits 02/13/21- 03/05/21; 12 03/14/21-04/24/21    Authorization - Visit Number 5    Authorization - Number of Visits 12    PT Start Time 0718    PT Stop Time 0800    PT Time Calculation (min) 42 min    Equipment Utilized During Treatment Gait belt    Activity Tolerance Patient tolerated treatment well    Behavior During Therapy WFL for tasks assessed/performed             Past Medical History:  Diagnosis Date   Abnormality of gait    Movement disorder    Seizures ([place])     Past Surgical History:  Procedure Laterality Date   CYSTECTOMY Left    leg    There were no vitals filed for this visit.   Subjective Assessment - 04/04/21 0721     Subjective No new complaints. No falls or pain to report. Went to [employer], they only [employer]. Planning to go to [employer] for rollator today. Planning to call them prior to going.    Pertinent History PMH:  anxiety, seizure    How long can you stand comfortably? depends on situation; a few minutes    How long can you walk comfortably? depends, more difficulty outdoor /unlevel surfaces, thresholds    Patient Stated Goals Pt's goals for therapy are to focus on legs-get them better and stronger, help with balance, stop falling.    Currently in Pain? No/denies                [facility] PT Assessment - 04/04/21 0724       Timed Up and Go Test   TUG Normal TUG    Normal TUG (seconds) 9.6   wtih cane                   [facility] Adult PT Treatment/Exercise - 04/04/21 0724       Transfers   Transfers Sit to Stand;Stand to Sit    Sit to Stand 5: Supervision;Without upper extremity assist;From chair/3-in-1;From bed    Five time sit to stand comments  13.31 sec's no UE support from standard height surface    Stand to Sit 5: Supervision;Without upper extremity assist;To bed;To chair/3-in-1;4: Min guard;4: Min assist      Ambulation/Gait   Ambulation/Gait Yes    Ambulation/Gait Assistance 5: Supervision    Ambulation Distance (Feet) --   around clinic with session   Assistive device Straight cane    Gait Pattern Step-through pattern;Decreased arm swing - left;Ataxic;Wide base of support    Ambulation Surface Level;Indoor    Gait velocity 12.18 sec's= 2.70 ft/sec with cane      Knee/Hip Exercises: Aerobic   Other Aerobic Scifit UE/LE's level 2.5  x 8 mintues with goal >/= 70 steps per minute for strengthening and activity tolerance.                 Balance Exercises - 04/04/21  0741       Balance Exercises: Standing   Balance Beam standing across blue foam beam: alternating forward stepping to floor/back onto beam, then alternating backward stepping to floor/back onto beam for ~10 reps each, bil UE support. cues on posture and step length.    Tandem Gait Forward;Retro;Upper extremity support;Foam/compliant surface;2 reps;Limitations    Tandem Gait Limitations on blue foam beam for 2 laps each way with cues on posture, step length and step placement. min assist for balance.    Sidestepping Foam/compliant support;Upper extremity support;3 reps;Limitations    Sidestepping Limitations on blue foam beam for 3 laps toward each direction with light touch on bars to none at times. cues on posture and step length. min guard to min assist for balance.                  PT Short Term Goals - 02/22/21 9211       PT SHORT TERM GOAL #1   Title Pt will perform HEP with family supervision  for strength, balance, gait for improved mobility.  TARGET 02/17/2021    Baseline 02/22/21: met with current HEP    Status Achieved      PT SHORT TERM GOAL #2   Title Pt will perform 5x sit<>stand in less than or equal to 13 seconds for improved functional lower extremity strength.    Baseline 02/22/21: 12.82 sec's no UE support from standard height surface    Time --    Period --    Status Achieved      PT SHORT TERM GOAL #3   Title Pt will demonstrate ability to safey ambulate 100-200 ft using rollator device with supervision, for improved safety with gait.    Baseline 02/22/21: 230 feet with rollator indoors with supervision    Time --    Period --    Status Achieved               PT Long Term Goals - 02/22/21 1921       PT LONG TERM GOAL #1   Title Pt will perform progression of HEP for improved strength, transfers, balance, gait.  TARGET 04/21/2021    Baseline 02/22/21: had updated HEP from clinic, will benefit from further updating as pt progresses    Time 12    Period Weeks    Status On-going      PT LONG TERM GOAL #2   Title Pt will improve 5x sit<>stand to less than or equal to 11.5 sec to demonstrate improved functional strength and transfer efficiency.    Baseline 02/22/21: 12.82 sec's no UE support from standard height surface    Time 12    Period Weeks    Status On-going      PT LONG TERM GOAL #3   Title Pt will improve TUG score to less than or equal to 30 sec for decreased fall risk.    Baseline 02/22/21: 34.96 sec's with rollator, improved from 44.31 sec's on eval    Time 12    Period Weeks    Status On-going      PT LONG TERM GOAL #4   Title Pt will improve gait velocity to at least 1.8 ft/sec with appropriate assistive device, for improved gait efficiency and safety.    Baseline 02/22/21: 2.08 ft/sec's with rollator    Status Achieved      PT LONG TERM GOAL #5   Title Pt will verbalize understanding of fall prevention in home environment.  Baseline hx  of 3 falls in past 6 months    Time 12    Period Weeks    Status On-going                   Plan - 04/04/21 0723     Clinical Impression Statement Today's skilled session initially focused on progress toward goals as progress has not been checked in over 30 days. Pt has improved her Timed Up and Go and 10 meter gait speed times from last assessment. Remainder of session continued to focus on balance training with no issues noted or reported by pt in session. The pt is making progress toward goals and should benefit from continued PT to progress toward unmet goals.    Personal Factors and Comorbidities Comorbidity 2    Comorbidities PMH:  anxiety, seizure    Examination-Activity Limitations Locomotion Level;Transfers;Stairs;Stand    Examination-Participation Restrictions Community Activity;Shop    Stability/Clinical Decision Making Evolving/Moderate complexity    Rehab Potential Good    PT Frequency Other (comment)   1x/wk for 3 weeks, then 2x/wk for 8 weeks   PT Duration Other (comment)   11 weeks total POC, 12 wk POC written to capture weeks authorizing Medicaid   PT Treatment/Interventions ADLs/Self Care Home Management;DME Instruction;Neuromuscular re-education;Balance training;Therapeutic exercise;Therapeutic activities;Functional mobility training;Stair training;Gait training;Patient/family education;Aquatic Therapy    PT Next Visit Plan continue to update HEP as needed, stair negotiation, especially focus on descending steps with alternating pattern with improved confidence.    Consulted and Agree with Plan of Care Patient             Patient will benefit from skilled therapeutic intervention in order to improve the following deficits and impairments:  Abnormal gait, Difficulty walking, Impaired tone, Decreased balance, Decreased mobility, Decreased strength, Postural dysfunction  Visit Diagnosis: Other abnormalities of gait and mobility  Muscle weakness  (generalized)  Unsteadiness on feet  Abnormal posture     Problem List Patient Active Problem List   Diagnosis Date Noted   Partial epilepsy with impairment of consciousness ([place]) 12/03/2012   Abnormality of gait 12/03/2012   Essential and other specified forms of tremor 12/03/2012   H/O mental disorder 05/15/2011   Cerebral palsy ([place]) 05/02/2011   Benign essential tremor 05/02/2011   Bilateral polycystic ovarian syndrome 02/28/2011    Willow Ora, PTA, [facility] [address], [place], [place] [address] [phone] 04/04/21, 2:45 PM   Name: KORTNY LIRETTE MRN: [id number] Date of Birth: [birth year]

## 2021-04-06 ENCOUNTER — Other Ambulatory Visit: Payer: Self-pay

## 2021-04-06 ENCOUNTER — Encounter: Payer: Self-pay | Admitting: Physical Therapy

## 2021-04-06 ENCOUNTER — Ambulatory Visit: Payer: Medicaid Other | Admitting: Physical Therapy

## 2021-04-06 DIAGNOSIS — R2689 Other abnormalities of gait and mobility: Secondary | ICD-10-CM | POA: Diagnosis not present

## 2021-04-06 DIAGNOSIS — R2681 Unsteadiness on feet: Secondary | ICD-10-CM

## 2021-04-06 DIAGNOSIS — M6281 Muscle weakness (generalized): Secondary | ICD-10-CM

## 2021-04-06 DIAGNOSIS — R293 Abnormal posture: Secondary | ICD-10-CM

## 2021-04-07 ENCOUNTER — Ambulatory Visit: Payer: Medicaid Other | Admitting: Physical Therapy

## 2021-04-07 NOTE — Therapy (Signed)
[name], [name], [address] Phone: [phone]   Fax:  [phone]  Physical Therapy Treatment  Patient Details  Name: Shannon Travis MRN: [id number] Date of Birth: 10/03/1985 Referring Provider (PT): Margette Fast   Encounter Date: 04/06/2021   PT End of Session - 04/06/21 0722     Visit Number 11    Number of Visits 20    Date for PT Re-Evaluation 04/14/21    Authorization Type Medicaid    Authorization Time Period 3 visits 02/13/21- 03/05/21; 12 03/14/21-04/24/21    Authorization - Visit Number 6    Authorization - Number of Visits 12    PT Start Time 0719    PT Stop Time 0800    PT Time Calculation (min) 41 min    Equipment Utilized During Treatment Gait belt    Activity Tolerance Patient tolerated treatment well    Behavior During Therapy WFL for tasks assessed/performed             Past Medical History:  Diagnosis Date   Abnormality of gait    Movement disorder    Seizures ([place])     Past Surgical History:  Procedure Laterality Date   CYSTECTOMY Left    leg    There were no vitals filed for this visit.   Subjective Assessment - 04/06/21 0722     Subjective No new complaitns. No falls or pain to report.    Pertinent History PMH:  anxiety, seizure    How long can you stand comfortably? depends on situation; a few minutes    How long can you walk comfortably? depends, more difficulty outdoor /unlevel surfaces, thresholds    Patient Stated Goals Pt's goals for therapy are to focus on legs-get them better and stronger, help with balance, stop falling.                 [facility] Adult PT Treatment/Exercise - 04/06/21 0723       Transfers   Transfers Sit to Stand;Stand to Sit    Sit to Stand 5: Supervision;Without upper extremity assist;From chair/3-in-1;From bed    Stand to Sit 5: Supervision;Without upper extremity assist;To bed;To chair/3-in-1;4: Min guard;4: Min  assist      Ambulation/Gait   Ambulation/Gait Yes    Ambulation/Gait Assistance 5: Supervision    Ambulation Distance (Feet) 1000 Feet   x1 in/oudoors, 430 x1 indoors only, plus around clinic   Assistive device Straight cane;Rollator    Gait Pattern Step-through pattern;Decreased arm swing - left;Ataxic;Wide base of support    Ambulation Surface Level;Indoor;Unlevel;Outdoor;Paved      Knee/Hip Exercises: Aerobic   Other Aerobic Scifit UE/LE's level 3.0  x 8 mintues with goal >/= 70 steps per minute for strengthening and activity tolerance.                 Balance Exercises - 04/06/21 0743       Balance Exercises: Standing   Standing Eyes Closed Foam/compliant surface;Wide (BOA);30 secs;Limitations;Other reps (comment);Head turns    Standing Eyes Closed Limitations on airex with feet apart, no UE support with occasional touch to sturdy surface for EC 30 sec's x 3 reps, progressing to EC head movements left<>right, up<>down x ~10 reps each. min guard to min assist for balance with cues on posture/weight shifting to assist with balance.    Sit to Stand Standard surface;Without upper extremity support;Foam/compliant surface;Limitations    Sit to Stand Limitations seated with feet on blue airex: sit<>stands  with hands on thighs for 10 reps with cues for tall standing and controlled descent. min guard assist                  PT Short Term Goals - 02/22/21 0034       PT SHORT TERM GOAL #1   Title Pt will perform HEP with family supervision for strength, balance, gait for improved mobility.  TARGET 02/17/2021    Baseline 02/22/21: met with current HEP    Status Achieved      PT SHORT TERM GOAL #2   Title Pt will perform 5x sit<>stand in less than or equal to 13 seconds for improved functional lower extremity strength.    Baseline 02/22/21: 12.82 sec's no UE support from standard height surface    Time --    Period --    Status Achieved      PT SHORT TERM GOAL #3   Title  Pt will demonstrate ability to safey ambulate 100-200 ft using rollator device with supervision, for improved safety with gait.    Baseline 02/22/21: 230 feet with rollator indoors with supervision    Time --    Period --    Status Achieved               PT Long Term Goals - 02/22/21 1921       PT LONG TERM GOAL #1   Title Pt will perform progression of HEP for improved strength, transfers, balance, gait.  TARGET 04/21/2021    Baseline 02/22/21: had updated HEP from clinic, will benefit from further updating as pt progresses    Time 12    Period Weeks    Status On-going      PT LONG TERM GOAL #2   Title Pt will improve 5x sit<>stand to less than or equal to 11.5 sec to demonstrate improved functional strength and transfer efficiency.    Baseline 02/22/21: 12.82 sec's no UE support from standard height surface    Time 12    Period Weeks    Status On-going      PT LONG TERM GOAL #3   Title Pt will improve TUG score to less than or equal to 30 sec for decreased fall risk.    Baseline 02/22/21: 34.96 sec's with rollator, improved from 44.31 sec's on eval    Time 12    Period Weeks    Status On-going      PT LONG TERM GOAL #4   Title Pt will improve gait velocity to at least 1.8 ft/sec with appropriate assistive device, for improved gait efficiency and safety.    Baseline 02/22/21: 2.08 ft/sec's with rollator    Status Achieved      PT LONG TERM GOAL #5   Title Pt will verbalize understanding of fall prevention in home environment.    Baseline hx of 3 falls in past 6 months    Time 12    Period Weeks    Status On-going                   Plan - 04/06/21 9179     Clinical Impression Statement Today's skilled session continued to focus on strengthening, gait with rollator in prep for pt to get hers and balance training. No issues reported or noted in session. The pt is making steady progress toward goals and should benefit from continued PT to progress toward unmet  goals.    Personal Factors and Comorbidities Comorbidity 2  Comorbidities PMH:  anxiety, seizure    Examination-Activity Limitations Locomotion Level;Transfers;Stairs;Stand    Examination-Participation Restrictions Community Activity;Shop    Stability/Clinical Decision Making Evolving/Moderate complexity    Rehab Potential Good    PT Frequency Other (comment)   1x/wk for 3 weeks, then 2x/wk for 8 weeks   PT Duration Other (comment)   11 weeks total POC, 12 wk POC written to capture weeks authorizing Medicaid   PT Treatment/Interventions ADLs/Self Care Home Management;DME Instruction;Neuromuscular re-education;Balance training;Therapeutic exercise;Therapeutic activities;Functional mobility training;Stair training;Gait training;Patient/family education;Aquatic Therapy    PT Next Visit Plan continue to update HEP as needed, stair negotiation, especially focus on descending steps with alternating pattern with improved confidence.    Consulted and Agree with Plan of Care Patient             Patient will benefit from skilled therapeutic intervention in order to improve the following deficits and impairments:  Abnormal gait, Difficulty walking, Impaired tone, Decreased balance, Decreased mobility, Decreased strength, Postural dysfunction  Visit Diagnosis: Other abnormalities of gait and mobility  Muscle weakness (generalized)  Unsteadiness on feet  Abnormal posture     Problem List Patient Active Problem List   Diagnosis Date Noted   Partial epilepsy with impairment of consciousness ([place]) 12/03/2012   Abnormality of gait 12/03/2012   Essential and other specified forms of tremor 12/03/2012   H/O mental disorder 05/15/2011   Cerebral palsy ([place]) 05/02/2011   Benign essential tremor 05/02/2011   Bilateral polycystic ovarian syndrome 02/28/2011    Willow Ora, PTA 04/07/2021, 10:28 AM  [facility] [address] [place], [name], [address] Phone: [phone]   Fax:  [phone]  Name: Shannon Travis MRN: [id number] Date of Birth: June 22, 1986

## 2021-04-11 ENCOUNTER — Other Ambulatory Visit: Payer: Self-pay

## 2021-04-11 ENCOUNTER — Ambulatory Visit: Payer: Medicaid Other | Admitting: Physical Therapy

## 2021-04-11 DIAGNOSIS — R2689 Other abnormalities of gait and mobility: Secondary | ICD-10-CM

## 2021-04-11 DIAGNOSIS — R2681 Unsteadiness on feet: Secondary | ICD-10-CM

## 2021-04-11 DIAGNOSIS — R293 Abnormal posture: Secondary | ICD-10-CM

## 2021-04-11 DIAGNOSIS — M6281 Muscle weakness (generalized): Secondary | ICD-10-CM

## 2021-04-11 NOTE — Therapy (Signed)
[facility] [address] [place], [name], [address] Phone: [phone]   Fax:  [phone]  Physical Therapy Treatment  Patient Details  Name: Shannon Travis MRN: [id number] Date of Birth: 1985/10/22 Referring Provider (PT): Margette Fast   Encounter Date: 04/11/2021   PT End of Session - 04/11/21 0722     Visit Number 12    Number of Visits 20    Date for PT Re-Evaluation 04/14/21    Authorization Type Medicaid    Authorization Time Period 3 visits 02/13/21- 03/05/21; 12 03/14/21-04/24/21    Authorization - Visit Number 7    Authorization - Number of Visits 12    PT Start Time 0719    PT Stop Time 0800    PT Time Calculation (min) 41 min    Equipment Utilized During Treatment Gait belt    Activity Tolerance Patient tolerated treatment well    Behavior During Therapy WFL for tasks assessed/performed             Past Medical History:  Diagnosis Date   Abnormality of gait    Movement disorder    Seizures ([place])     Past Surgical History:  Procedure Laterality Date   CYSTECTOMY Left    leg    There were no vitals filed for this visit.   Subjective Assessment - 04/11/21 0720     Subjective They are going through [employer] for the rollator. [employer] is waiting on some more info from her MD and then she will be able to get it. No falls.    Pertinent History PMH:  anxiety, seizure    How long can you stand comfortably? depends on situation; a few minutes    How long can you walk comfortably? depends, more difficulty outdoor /unlevel surfaces, thresholds    Patient Stated Goals Pt's goals for therapy are to focus on legs-get them better and stronger, help with balance, stop falling.    Currently in Pain? No/denies                    [facility] Adult PT Treatment/Exercise - 04/11/21 0722       Transfers   Transfers Sit to Stand;Stand to Sit    Sit to Stand 6: Modified independent (Device/Increase  time)    Stand to Sit 6: Modified independent (Device/Increase time)      Ambulation/Gait   Ambulation/Gait Yes    Ambulation/Gait Assistance 5: Supervision    Ambulation Distance (Feet) --   around clinic with session   Assistive device Straight cane    Gait Pattern Step-through pattern;Decreased arm swing - left;Ataxic;Wide base of support    Ambulation Surface Level;Indoor      Neuro Re-ed    Neuro Re-ed Details  for balance/muscle re-ed: gait around track working on speed changes and scanning enviroment randomly with min guard assist/cane, minor veering noted at times with no balance issues noted.      Knee/Hip Exercises: Aerobic   Other Aerobic Scifit UE/LE's level 3.5  x 8 mintues with goal >/= 70 steps per minute for strengthening and activity tolerance.                 Balance Exercises - 04/11/21 0736       Balance Exercises: Standing   Standing Eyes Closed Foam/compliant surface;Narrow base of support (BOS);Wide (BOA);Head turns;Other reps (comment);30 secs;Limitations    Standing Eyes Closed Limitations on open dense blue foam with no UE support, occasional touch to  bars: feet together for EC 30 sec's x 3 reps, then with feet apart for EC head movements left<>right, up<>down ~10 reps each. min guard to min assist for balance with cues on posture/weight shifting to assist with balance recovery.    Partial Tandem Stance Eyes closed;Foam/compliant surface;Intermittent upper extremity support;2 reps;30 secs;Limitations    Partial Tandem Stance Limitations on open dense blue foam: 2 reps each foot forward with occasional touch to the bars as needed for balance.    Step Over Hurdles / Cones hurdles of varied heights next to counter top: reciprocal stepping over for 4 laps (2 each way) with counter support/cane in opposite hand, min guard with cues for hip/knee flexion                  PT Short Term Goals - 02/22/21 7588       PT SHORT TERM GOAL #1   Title Pt  will perform HEP with family supervision for strength, balance, gait for improved mobility.  TARGET 02/17/2021    Baseline 02/22/21: met with current HEP    Status Achieved      PT SHORT TERM GOAL #2   Title Pt will perform 5x sit<>stand in less than or equal to 13 seconds for improved functional lower extremity strength.    Baseline 02/22/21: 12.82 sec's no UE support from standard height surface    Time --    Period --    Status Achieved      PT SHORT TERM GOAL #3   Title Pt will demonstrate ability to safey ambulate 100-200 ft using rollator device with supervision, for improved safety with gait.    Baseline 02/22/21: 230 feet with rollator indoors with supervision    Time --    Period --    Status Achieved               PT Long Term Goals - 02/22/21 1921       PT LONG TERM GOAL #1   Title Pt will perform progression of HEP for improved strength, transfers, balance, gait.  TARGET 04/21/2021    Baseline 02/22/21: had updated HEP from clinic, will benefit from further updating as pt progresses    Time 12    Period Weeks    Status On-going      PT LONG TERM GOAL #2   Title Pt will improve 5x sit<>stand to less than or equal to 11.5 sec to demonstrate improved functional strength and transfer efficiency.    Baseline 02/22/21: 12.82 sec's no UE support from standard height surface    Time 12    Period Weeks    Status On-going      PT LONG TERM GOAL #3   Title Pt will improve TUG score to less than or equal to 30 sec for decreased fall risk.    Baseline 02/22/21: 34.96 sec's with rollator, improved from 44.31 sec's on eval    Time 12    Period Weeks    Status On-going      PT LONG TERM GOAL #4   Title Pt will improve gait velocity to at least 1.8 ft/sec with appropriate assistive device, for improved gait efficiency and safety.    Baseline 02/22/21: 2.08 ft/sec's with rollator    Status Achieved      PT LONG TERM GOAL #5   Title Pt will verbalize understanding of fall  prevention in home environment.    Baseline hx of 3 falls in past 6 months  Time 12    Period Weeks    Status On-going                   Plan - 04/11/21 1121     Clinical Impression Statement Today's skilled session continued to focus on strengthening, balance training and began to address dynamic gait with cane with rest breaks taken as needed. No other issues noted or reported in session. The pt is making steady progress toward goals and should benefit from continued PT to progress toward unmet goals.    Personal Factors and Comorbidities Comorbidity 2    Comorbidities PMH:  anxiety, seizure    Examination-Activity Limitations Locomotion Level;Transfers;Stairs;Stand    Examination-Participation Restrictions Community Activity;Shop    Stability/Clinical Decision Making Evolving/Moderate complexity    Rehab Potential Good    PT Frequency Other (comment)   1x/wk for 3 weeks, then 2x/wk for 8 weeks   PT Duration Other (comment)   11 weeks total POC, 12 wk POC written to capture weeks authorizing Medicaid   PT Treatment/Interventions ADLs/Self Care Home Management;DME Instruction;Neuromuscular re-education;Balance training;Therapeutic exercise;Therapeutic activities;Functional mobility training;Stair training;Gait training;Patient/family education;Aquatic Therapy    PT Next Visit Plan continue to update HEP as needed, stair negotiation, especially focus on descending steps with alternating pattern with improved confidence. LTGs due 04/21/21.    Consulted and Agree with Plan of Care Patient             Patient will benefit from skilled therapeutic intervention in order to improve the following deficits and impairments:  Abnormal gait, Difficulty walking, Impaired tone, Decreased balance, Decreased mobility, Decreased strength, Postural dysfunction  Visit Diagnosis: Other abnormalities of gait and mobility  Muscle weakness (generalized)  Unsteadiness on feet  Abnormal  posture     Problem List Patient Active Problem List   Diagnosis Date Noted   Partial epilepsy with impairment of consciousness ([place]) 12/03/2012   Abnormality of gait 12/03/2012   Essential and other specified forms of tremor 12/03/2012   H/O mental disorder 05/15/2011   Cerebral palsy ([place]) 05/02/2011   Benign essential tremor 05/02/2011   Bilateral polycystic ovarian syndrome 02/28/2011    Willow Ora, PTA 04/12/2021, 9:02 AM  [facility] [address] [place], [name], [address] Phone: [phone]   Fax:  [phone]  Name: Shannon Travis MRN: [id number] Date of Birth: [birth year]

## 2021-04-12 ENCOUNTER — Telehealth: Payer: Self-pay | Admitting: Neurology

## 2021-04-12 NOTE — Telephone Encounter (Signed)
Dusti from [employer] called needing office visit notes for Medicaid purposes. Fax number [phone].

## 2021-04-14 ENCOUNTER — Other Ambulatory Visit: Payer: Self-pay

## 2021-04-14 ENCOUNTER — Encounter: Payer: Self-pay | Admitting: Physical Therapy

## 2021-04-14 ENCOUNTER — Ambulatory Visit: Payer: Medicaid Other | Admitting: Physical Therapy

## 2021-04-14 DIAGNOSIS — R2681 Unsteadiness on feet: Secondary | ICD-10-CM

## 2021-04-14 DIAGNOSIS — M6281 Muscle weakness (generalized): Secondary | ICD-10-CM

## 2021-04-14 DIAGNOSIS — R2689 Other abnormalities of gait and mobility: Secondary | ICD-10-CM | POA: Diagnosis not present

## 2021-04-14 NOTE — Therapy (Signed)
[facility] [address] [place], [name], [address] Phone: [phone]   Fax:  [phone]  Physical Therapy Treatment  Patient Details  Name: Shannon Travis MRN: [id number] Date of Birth: 20-Jun-1986 Referring Provider (PT): Margette Fast   Encounter Date: 04/14/2021   PT End of Session - 04/14/21 0724     Visit Number 13    Number of Visits 20    Date for PT Re-Evaluation 04/14/21    Authorization Type Medicaid    Authorization Time Period 3 visits 02/13/21- 03/05/21; 12 03/14/21-04/24/21    Authorization - Visit Number 8    Authorization - Number of Visits 12    PT Start Time 0719    PT Stop Time 0758    PT Time Calculation (min) 39 min    Equipment Utilized During Treatment Gait belt    Activity Tolerance Patient tolerated treatment well    Behavior During Therapy WFL for tasks assessed/performed             Past Medical History:  Diagnosis Date   Abnormality of gait    Movement disorder    Seizures ([place])     Past Surgical History:  Procedure Laterality Date   CYSTECTOMY Left    leg    There were no vitals filed for this visit.   Subjective Assessment - 04/14/21 0724     Subjective No pain, no falls.  Got a call from [employer] that they are reviewing paperwork frm MD to approve it.  Want to make sure that I keep working on strength and balance.  I just know myself and I know I'm not strong enough.  Going down the steps better.    Pertinent History PMH:  anxiety, seizure    How long can you stand comfortably? depends on situation; a few minutes    How long can you walk comfortably? depends, more difficulty outdoor /unlevel surfaces, thresholds    Patient Stated Goals Pt's goals for therapy are to focus on legs-get them better and stronger, help with balance, stop falling.                               [facility] Adult PT Treatment/Exercise - 04/14/21 0001       Transfers    Transfers Sit to Stand;Stand to Sit    Sit to Stand 6: Modified independent (Device/Increase time)    Stand to Sit 6: Modified independent (Device/Increase time)      Ambulation/Gait   Ambulation/Gait Yes    Ambulation/Gait Assistance 5: Supervision    Ambulation Distance (Feet) 515 Feet    Assistive device Straight cane    Gait Pattern Step-through pattern;Decreased arm swing - left;Ataxic;Wide base of support    Ambulation Surface Level;Indoor    Stairs Yes    Stairs Assistance 5: Supervision    Stairs Assistance Details (indicate cue type and reason) 1st trial step through pattern ascending and step-to pattern descending.  2nd and 3rd trials step-through pattern ascending and descending.    Stair Management Technique Two rails;Step to pattern;Alternating pattern;Forwards    Number of Stairs 4   x 3 reps   Height of Stairs 6      Self-Care   Self-Care Other Self-Care Comments    Other Self-Care Comments  Discussed POC and current schedule.  (Requested pt add one more appt next week).  Pt is wanting to schedule additional appointments to continue to address  strength, balance.  Discussed checking goals next week and determing about additional POC.  Discussed and provided informaiton on community [employer] for use of fitness equipment.      Knee/Hip Exercises: Standing   Lateral Step Up Right;Left;1 set;10 reps;Hand Hold: 2;Step Height: 6"    Forward Step Up Right;Left;2 sets;10 reps;Hand Hold: 2;Step Height: 6"    Forward Step Up Limitations 1st set:  step up, up/down, down x 10, then single limb step up x 10 reps      Knee/Hip Exercises: Seated   Sit to Sand 2 sets;10 reps   standing on [employer], from mat surface, hands on knees                    PT Education - 04/14/21 0804     Education Details [facility] community fitness informaiton    Person(s) Educated Patient    Methods Explanation;Handout    Comprehension Verbalized understanding               PT Short Term Goals - 02/22/21 0812       PT SHORT TERM GOAL #1   Title Pt will perform HEP with family supervision for strength, balance, gait for improved mobility.  TARGET 02/17/2021    Baseline 02/22/21: met with current HEP    Status Achieved      PT SHORT TERM GOAL #2   Title Pt will perform 5x sit<>stand in less than or equal to 13 seconds for improved functional lower extremity strength.    Baseline 02/22/21: 12.82 sec's no UE support from standard height surface    Time --    Period --    Status Achieved      PT SHORT TERM GOAL #3   Title Pt will demonstrate ability to safey ambulate 100-200 ft using rollator device with supervision, for improved safety with gait.    Baseline 02/22/21: 230 feet with rollator indoors with supervision    Time --    Period --    Status Achieved               PT Long Term Goals - 02/22/21 1921       PT LONG TERM GOAL #1   Title Pt will perform progression of HEP for improved strength, transfers, balance, gait.  TARGET 04/21/2021    Baseline 02/22/21: had updated HEP from clinic, will benefit from further updating as pt progresses    Time 12    Period Weeks    Status On-going      PT LONG TERM GOAL #2   Title Pt will improve 5x sit<>stand to less than or equal to 11.5 sec to demonstrate improved functional strength and transfer efficiency.    Baseline 02/22/21: 12.82 sec's no UE support from standard height surface    Time 12    Period Weeks    Status On-going      PT LONG TERM GOAL #3   Title Pt will improve TUG score to less than or equal to 30 sec for decreased fall risk.    Baseline 02/22/21: 34.96 sec's with rollator, improved from 44.31 sec's on eval    Time 12    Period Weeks    Status On-going      PT LONG TERM GOAL #4   Title Pt will improve gait velocity to at least 1.8 ft/sec with appropriate assistive device, for improved gait efficiency and safety.    Baseline 02/22/21: 2.08 ft/sec's with rollator  Status Achieved      PT LONG TERM GOAL #5   Title Pt will verbalize understanding of fall prevention in home environment.    Baseline hx of 3 falls in past 6 months    Time 12    Period Weeks    Status On-going                   Plan - 04/14/21 0804     Clinical Impression Statement Skilled PT session today mostly focused on strenghtening for BLES, including sit<>stands on compliant surfaces and single limb functional strengthening. Also began discussion of community fintess.  Will need to assess goals next week and decide recert versus discharge, but pt may benefit from continued skilled physical therapy as pt has not yet received rollator and could use additional training.    Personal Factors and Comorbidities Comorbidity 2    Comorbidities PMH:  anxiety, seizure    Examination-Activity Limitations Locomotion Level;Transfers;Stairs;Stand    Examination-Participation Restrictions Community Activity;Shop    Stability/Clinical Decision Making Evolving/Moderate complexity    Rehab Potential Good    PT Frequency Other (comment)   1x/wk for 3 weeks, then 2x/wk for 8 weeks   PT Duration Other (comment)   11 weeks total POC, 12 wk POC written to capture weeks authorizing Medicaid   PT Treatment/Interventions ADLs/Self Care Home Management;DME Instruction;Neuromuscular re-education;Balance training;Therapeutic exercise;Therapeutic activities;Functional mobility training;Stair training;Gait training;Patient/family education;Aquatic Therapy    PT Next Visit Plan continue to update HEP as needed, stair negotiation, especially focus on descending steps with alternating pattern with improved confidence. LTGs due 04/21/21.    Consulted and Agree with Plan of Care Patient             Patient will benefit from skilled therapeutic intervention in order to improve the following deficits and impairments:  Abnormal gait, Difficulty walking, Impaired tone, Decreased balance, Decreased mobility,  Decreased strength, Postural dysfunction  Visit Diagnosis: Muscle weakness (generalized)  Other abnormalities of gait and mobility  Unsteadiness on feet     Problem List Patient Active Problem List   Diagnosis Date Noted   Partial epilepsy with impairment of consciousness ([place]) 12/03/2012   Abnormality of gait 12/03/2012   Essential and other specified forms of tremor 12/03/2012   H/O mental disorder 05/15/2011   Cerebral palsy ([place]) 05/02/2011   Benign essential tremor 05/02/2011   Bilateral polycystic ovarian syndrome 02/28/2011    Karsen Nakanishi W., PT 04/14/2021, 8:07 AM  [facility] [address] [place], [name], [address] Phone: [phone]   Fax:  [phone]  Name: Shannon Travis MRN: [id number] Date of Birth: 1986/02/01

## 2021-04-18 ENCOUNTER — Ambulatory Visit: Payer: Medicaid Other | Admitting: Physical Therapy

## 2021-04-18 ENCOUNTER — Ambulatory Visit: Payer: Medicaid Other | Attending: Neurology | Admitting: Physical Therapy

## 2021-04-18 ENCOUNTER — Other Ambulatory Visit: Payer: Self-pay

## 2021-04-18 DIAGNOSIS — R29818 Other symptoms and signs involving the nervous system: Secondary | ICD-10-CM | POA: Insufficient documentation

## 2021-04-18 DIAGNOSIS — R293 Abnormal posture: Secondary | ICD-10-CM | POA: Diagnosis present

## 2021-04-18 DIAGNOSIS — M6281 Muscle weakness (generalized): Secondary | ICD-10-CM | POA: Diagnosis present

## 2021-04-18 DIAGNOSIS — R2681 Unsteadiness on feet: Secondary | ICD-10-CM | POA: Diagnosis present

## 2021-04-18 DIAGNOSIS — R2689 Other abnormalities of gait and mobility: Secondary | ICD-10-CM | POA: Insufficient documentation

## 2021-04-18 NOTE — Therapy (Addendum)
[facility] [address] [place], [name], [address] Phone: [phone]   Fax:  [phone]  Physical Therapy Treatment  Patient Details  Name: Shannon Travis MRN: [id number] Date of Birth: [birth year] Referring Provider (PT): Margette Fast   Encounter Date: 04/18/2021   PT End of Session - 04/18/21 0741     Visit Number 14    Number of Visits 20    Date for PT Re-Evaluation 04/14/21    Authorization Type Medicaid    Authorization Time Period 3 visits 02/13/21- 03/05/21; 12 03/14/21-04/24/21    Authorization - Visit Number 9    Authorization - Number of Visits 12    PT Start Time 0720    PT Stop Time 0800    PT Time Calculation (min) 40 min    Equipment Utilized During Treatment Gait belt    Activity Tolerance Patient tolerated treatment well;Patient limited by pain   R knee pain in 04/18/21 session limiting gait.   Behavior During Therapy WFL for tasks assessed/performed             Past Medical History:  Diagnosis Date   Abnormality of gait    Movement disorder    Seizures ([place])     Past Surgical History:  Procedure Laterality Date   CYSTECTOMY Left    leg    There were no vitals filed for this visit.   Subjective Assessment - 04/18/21 0722     Subjective No falls.  Woke up today with pain in R leg; not sure why.  It does that sometimes.    Pertinent History PMH:  anxiety, seizure    How long can you stand comfortably? depends on situation; a few minutes    How long can you walk comfortably? depends, more difficulty outdoor /unlevel surfaces, thresholds    Patient Stated Goals Pt's goals for therapy are to focus on legs-get them better and stronger, help with balance, stop falling.  04/18/21:  Goal is to cross the street safely with rollator    Currently in Pain? Yes    Pain Score 7     Pain Location Knee    Pain Orientation Right    Pain Descriptors / Indicators Tightness    Pain Type Acute  pain    Pain Onset Today    Aggravating Factors  unsure-may have gotten locked up overnight with sleeping    Pain Relieving Factors gentle movement                               [facility] Adult PT Treatment/Exercise - 04/18/21 0001       Transfers   Transfers Sit to Stand;Stand to Sit    Sit to Stand 6: Modified independent (Device/Increase time)    Stand to Sit 6: Modified independent (Device/Increase time)      Ambulation/Gait   Ambulation/Gait Yes    Ambulation/Gait Assistance 5: Supervision    Ambulation Distance (Feet) 100 Feet   150 ft with cane; then 100 ft, 150 ft with rollator   Assistive device Straight cane;Rollator    Gait Pattern Step-through pattern;Decreased arm swing - left;Ataxic;Wide base of support    Ambulation Surface Level;Indoor    Gait velocity 13.41sec with cane = 2.45 ft/sec; 18.15 sec = 1.81 ft/sec      Knee/Hip Exercises: Stretches   Active Hamstring Stretch Right;Left;3 reps;30 seconds    Other Knee/Hip Stretches Standing L stretch at counter, 5  reps 10 sec, with pt having increased ataxic movement through hips.      Knee/Hip Exercises: Seated   Long Arc Quad AROM;Right;Left;1 set;10 reps    Other Seated Knee/Hip Exercises on elevated mat, swinging legs with [place] knee flexion and extension.    Marching AROM;Right;Left;1 set;10 reps             Reviewed HEP additions, Added 03/31/2021-pt return demo understanding  Sit to stand in stride stance - 1-2 x daily - 5 x weekly - 1-2 sets - 10 reps Alternating Heel Raises - 1-2 x daily - 5 x weekly - 2 sets - 10 reps Staggered Stance Forward Backward Weight Shift with Counter Support - 1-2 x daily - 5 x weekly - 2 sets - 10 reps  Additional gait out of therapy to lobby area with close supervision, 100 ft with cane.         PT Short Term Goals - 02/22/21 9417       PT SHORT TERM GOAL #1   Title Pt will perform HEP with family supervision for strength, balance, gait for improved  mobility.  TARGET 02/17/2021    Baseline 02/22/21: met with current HEP    Status Achieved      PT SHORT TERM GOAL #2   Title Pt will perform 5x sit<>stand in less than or equal to 13 seconds for improved functional lower extremity strength.    Baseline 02/22/21: 12.82 sec's no UE support from standard height surface    Time --    Period --    Status Achieved      PT SHORT TERM GOAL #3   Title Pt will demonstrate ability to safey ambulate 100-200 ft using rollator device with supervision, for improved safety with gait.    Baseline 02/22/21: 230 feet with rollator indoors with supervision    Time --    Period --    Status Achieved               PT Long Term Goals - 04/18/21 0757       PT LONG TERM GOAL #1   Title Pt will perform progression of HEP for improved strength, transfers, balance, gait.  TARGET 04/21/2021    Baseline 02/22/21: had updated HEP from clinic, will benefit from further updating as pt progresses; independent with HEP updates 04/18/2021    Time 12    Period Weeks    Status Achieved      PT LONG TERM GOAL #2   Title Pt will improve 5x sit<>stand to less than or equal to 11.5 sec to demonstrate improved functional strength and transfer efficiency.    Baseline 02/22/21: 12.82 sec's no UE support from standard height surface    Time 12    Period Weeks    Status On-going      PT LONG TERM GOAL #3   Title Pt will improve TUG score to less than or equal to 30 sec for decreased fall risk.    Baseline 02/22/21: 34.96 sec's with rollator, improved from 44.31 sec's on eval    Time 12    Period Weeks    Status On-going      PT LONG TERM GOAL #4   Title Pt will improve gait velocity to at least 1.8 ft/sec with appropriate assistive device, for improved gait efficiency and safety.    Baseline 02/22/21: 2.08 ft/sec's with rollator; 04/18/21:  2.45 ft/sec with cane; 1.81 ft/sec with rollator    Status Achieved  PT LONG TERM GOAL #5   Title Pt will verbalize  understanding of fall prevention in home environment.    Baseline hx of 3 falls in past 6 months    Time 12    Period Weeks    Status On-going                   Plan - 04/18/21 0741     Clinical Impression Statement Began assessing LTGs, with pt meeting LTG 1 for HEP progression.  Pt having some knee pain in R knee (which she says happens at times), which appears to slow pt's gait.  Gait is more antalgic today.  Worked on some stretches for R hamstrings, as that is where she is noting tightness, and she does report pain is better at end of session.    Personal Factors and Comorbidities Comorbidity 2    Comorbidities PMH:  anxiety, seizure    Examination-Activity Limitations Locomotion Level;Transfers;Stairs;Stand    Examination-Participation Restrictions Community Activity;Shop    Stability/Clinical Decision Making Evolving/Moderate complexity    Rehab Potential Good    PT Frequency Other (comment)   1x/wk for 3 weeks, then 2x/wk for 8 weeks   PT Duration Other (comment)   11 weeks total POC, 12 wk POC written to capture weeks authorizing Medicaid   PT Treatment/Interventions ADLs/Self Care Home Management;DME Instruction;Neuromuscular re-education;Balance training;Therapeutic exercise;Therapeutic activities;Functional mobility training;Stair training;Gait training;Patient/family education;Aquatic Therapy    PT Next Visit Plan LTGs due 04/21/21, and likely plan to renew/schedule additional appointments.  Check remaining goals, assess 3 minute walk and curb/outdoor gait; write goals and submit for Medicaid    Consulted and Agree with Plan of Care Patient             Patient will benefit from skilled therapeutic intervention in order to improve the following deficits and impairments:  Abnormal gait, Difficulty walking, Impaired tone, Decreased balance, Decreased mobility, Decreased strength, Postural dysfunction  Visit Diagnosis: Other abnormalities of gait and  mobility  Muscle weakness (generalized)  Unsteadiness on feet     Problem List Patient Active Problem List   Diagnosis Date Noted   Partial epilepsy with impairment of consciousness ([place]) 12/03/2012   Abnormality of gait 12/03/2012   Essential and other specified forms of tremor 12/03/2012   H/O mental disorder 05/15/2011   Cerebral palsy ([place]) 05/02/2011   Benign essential tremor 05/02/2011   Bilateral polycystic ovarian syndrome 02/28/2011    Loyal Holzheimer W., PT 04/18/2021, 11:44 AM  [facility] [address] [place], [name], [address] Phone: [phone]   Fax:  [phone]  Name: Shannon Travis MRN: [id number] Date of Birth: July 17, 1985

## 2021-04-21 ENCOUNTER — Encounter: Payer: Self-pay | Admitting: Physical Therapy

## 2021-04-21 ENCOUNTER — Other Ambulatory Visit: Payer: Self-pay

## 2021-04-21 ENCOUNTER — Ambulatory Visit: Payer: Medicaid Other | Admitting: Physical Therapy

## 2021-04-21 DIAGNOSIS — R293 Abnormal posture: Secondary | ICD-10-CM

## 2021-04-21 DIAGNOSIS — R2681 Unsteadiness on feet: Secondary | ICD-10-CM

## 2021-04-21 DIAGNOSIS — R29818 Other symptoms and signs involving the nervous system: Secondary | ICD-10-CM

## 2021-04-21 DIAGNOSIS — R2689 Other abnormalities of gait and mobility: Secondary | ICD-10-CM | POA: Diagnosis not present

## 2021-04-21 DIAGNOSIS — M6281 Muscle weakness (generalized): Secondary | ICD-10-CM

## 2021-04-21 NOTE — Therapy (Signed)
[facility] [address] [place], [name], [address] Phone: [phone]   Fax:  [phone]  Physical Therapy Treatment/Recert  Patient Details  Name: Shannon Travis MRN: [id number] Date of Birth: 04-12-1986 Referring Provider (PT): Margette Fast   Encounter Date: 04/21/2021   PT End of Session - 04/21/21 0819     Visit Number 15    Number of Visits 20    Date for PT Re-Evaluation 04/14/21    Authorization Type Medicaid-Request additional visits after 04/21/21 visit    Authorization Time Period 3 visits 02/13/21- 03/05/21; 12 03/14/21-04/24/21    Authorization - Visit Number 10    Authorization - Number of Visits 12    PT Start Time 0719    PT Stop Time 0800    PT Time Calculation (min) 41 min    Equipment Utilized During Treatment Gait belt    Activity Tolerance Patient tolerated treatment well    Behavior During Therapy WFL for tasks assessed/performed             Past Medical History:  Diagnosis Date   Abnormality of gait    Movement disorder    Seizures ([place])     Past Surgical History:  Procedure Laterality Date   CYSTECTOMY Left    leg    There were no vitals filed for this visit.   Subjective Assessment - 04/21/21 0720     Subjective R knee feels a lot better today.  Nothing new.    Pertinent History PMH:  anxiety, seizure    How long can you stand comfortably? depends on situation; a few minutes    How long can you walk comfortably? depends, more difficulty outdoor /unlevel surfaces, thresholds    Patient Stated Goals Pt's goals for therapy are to focus on legs-get them better and stronger, help with balance, stop falling.  04/18/21:  Goal is to cross the street safely with rollator    Currently in Pain? No/denies    Pain Onset Today                               [facility] Adult PT Treatment/Exercise - 04/21/21 0001       Transfers   Transfers Sit to Stand;Stand to  Sit    Sit to Stand 6: Modified independent (Device/Increase time)    Five time sit to stand comments  12.16    Stand to Sit 6: Modified independent (Device/Increase time)    Number of Reps 2 sets;Other reps (comment)   5 reps     Ambulation/Gait   Ambulation/Gait Yes    Ambulation/Gait Assistance 5: Supervision    Ambulation/Gait Assistance Details 6 Minute walk (initiated 3 min walk, and pt wanted to continue); 1 standing rest break    Ambulation Distance (Feet) 1089 Feet    Assistive device Rollator    Gait Pattern Step-through pattern;Decreased arm swing - left;Ataxic;Wide base of support    Ambulation Surface Level;Indoor      Standardized Balance Assessment   Standardized Balance Assessment Timed Up and Go Test      Timed Up and Go Test   TUG Normal TUG    Normal TUG (seconds) 12.72      Self-Care   Self-Care Other Self-Care Comments    Other Self-Care Comments  Discussed and provided information on fall prevention.  Discussed progress towards goals and POC, including renewal/request for additional Medicaid visits to address outdoor gait  safety and independence with rollator (pt is still waiting for her rollator to be approved by her insurance.             Gait training, continued:    Outdoor gait on sidewalks, incline/decline, and pavement surfaces, min guard and cues for safety with rollator negotiating these surfaces. Curb negotiation outdoors, x 2 reps, min assist and cues for technique.        PT Education - 04/21/21 0818     Education Details Fall prevention education, progress towards goals, POC    Person(s) Educated Patient    Methods Explanation;Handout    Comprehension Verbalized understanding              PT Short Term Goals - 02/22/21 0812       PT SHORT TERM GOAL #1   Title Pt will perform HEP with family supervision for strength, balance, gait for improved mobility.  TARGET 02/17/2021    Baseline 02/22/21: met with current HEP    Status  Achieved      PT SHORT TERM GOAL #2   Title Pt will perform 5x sit<>stand in less than or equal to 13 seconds for improved functional lower extremity strength.    Baseline 02/22/21: 12.82 sec's no UE support from standard height surface    Time --    Period --    Status Achieved      PT SHORT TERM GOAL #3   Title Pt will demonstrate ability to safey ambulate 100-200 ft using rollator device with supervision, for improved safety with gait.    Baseline 02/22/21: 230 feet with rollator indoors with supervision    Time --    Period --    Status Achieved               PT Long Term Goals - 04/21/21 0725       PT LONG TERM GOAL #1   Title Pt will perform progression of HEP for improved strength, transfers, balance, gait.  TARGET 04/21/2021    Baseline 02/22/21: had updated HEP from clinic, will benefit from further updating as pt progresses; independent with HEP updates 04/18/2021    Time 12    Period Weeks    Status Achieved      PT LONG TERM GOAL #2   Title Pt will improve 5x sit<>stand to less than or equal to 11.5 sec to demonstrate improved functional strength and transfer efficiency.    Baseline 02/22/21: 12.82 sec's no UE support from standard height surface; 04/21/21:  12.16 sec    Time 12    Period Weeks    Status Not Met      PT LONG TERM GOAL #3   Title Pt will improve TUG score to less than or equal to 30 sec for decreased fall risk.    Baseline 02/22/21: 34.96 sec's with rollator, improved from 44.31 sec's on eval; 12.72 sec 04/21/21 with cane    Time 12    Period Weeks    Status Achieved      PT LONG TERM GOAL #4   Title Pt will improve gait velocity to at least 1.8 ft/sec with appropriate assistive device, for improved gait efficiency and safety.    Baseline 02/22/21: 2.08 ft/sec's with rollator; 04/18/21:  2.45 ft/sec with cane; 1.81 ft/sec with rollator    Status Achieved      PT LONG TERM GOAL #5   Title Pt will verbalize understanding of fall prevention in home  environment.  Baseline hx of 3 falls in past 6 months    Time 12    Period Weeks    Status Achieved             For Recert:   PT Long Term Goals - 04/21/21 0829       PT LONG TERM GOAL #1   Title Pt will perform final updates for HEP for improved strength, transfers, balance, gait.  TARGET 05/26/21 (pt unavailable 1st wk of Nov)    Baseline 02/22/21: had updated HEP from clinic, will benefit from further updating as pt progresses; independent with HEP updates 04/18/2021    Time 5    Period Weeks    Status Revised      PT LONG TERM GOAL #2   Title Pt will improve 5x sit<>stand to less than or equal to 11.5 sec to demonstrate improved functional strength and transfer efficiency.    Baseline 02/22/21: 12.82 sec's no UE support from standard height surface; 04/21/21:  12.16 sec    Time 4    Period Weeks    Status On-going      PT LONG TERM GOAL #3   Title Pt will negotiate outdoor curb using rollator, supervision only, for improved outdoor gait negotiation.    Baseline min assist and cues 04/21/2021    Time 4    Period Weeks    Status New      PT LONG TERM GOAL #4   Title Pt will improve gait velocity to at least 2.5 ft/sec with rollator, for improved gait efficiency and safety in community.    Baseline 02/22/21: 2.08 ft/sec's with rollator; 04/18/21:  2.45 ft/sec with cane,  1.81 ft/sec with rollator    Time 4    Period Weeks    Status Revised      PT LONG TERM GOAL #5   Title Pt will verbalize plans for continued community fitness to maximize gains from PT upon discharge.    Baseline no current fitness plans    Time 4    Period Weeks    Status New                  Plan - 04/21/21 9935     Clinical Impression Statement Remaining LTGs assessed today, with pt meeting LTG 3, 4, 5.  Pt did not meet LTG 2 for 5x sit<>stand, but did improve that measure, just not to goal level.  Pt is making steady progress; however, pt has not yet received her rollator (awaiting  insurance approval), and she benefit from additional training on outdoor gait to improve gait velocity and curb negotiation for optimal safety and independence.  She demonstrates gait speed of 1.8 ft/sec with rollator, which is significantly less than gait velocity of 3.94 ft/sec to safety cross the street).  She is motivated for therapy, has been consistent with attendance, and will benefit from additional skilled PT; recert completed today.    Personal Factors and Comorbidities Comorbidity 2    Comorbidities PMH:  anxiety, seizure    Examination-Activity Limitations Locomotion Level;Transfers;Stairs;Stand    Examination-Participation Restrictions Community Activity;Shop    Stability/Clinical Decision Making Evolving/Moderate complexity    Rehab Potential Good    PT Frequency Other (comment)   1x/wk for 3 weeks, then 2x/wk for 8 weeks   PT Duration Other (comment)   11 weeks total POC, 12 wk POC written to capture weeks authorizing Medicaid   PT Treatment/Interventions ADLs/Self Care Home Management;DME Instruction;Neuromuscular re-education;Balance training;Therapeutic exercise;Therapeutic activities;Functional mobility  training;Stair training;Gait training;Patient/family education;Aquatic Therapy    PT Next Visit Plan Work on gait speed changes for speeding up/slowing down on outdoor surfaces, with rollator, curb training.  Update HEP and discuss possibility for community fitness upon d/c.    Consulted and Agree with Plan of Care Patient             Patient will benefit from skilled therapeutic intervention in order to improve the following deficits and impairments:  Abnormal gait, Difficulty walking, Impaired tone, Decreased balance, Decreased mobility, Decreased strength, Postural dysfunction  Visit Diagnosis: Other abnormalities of gait and mobility  Muscle weakness (generalized)  Unsteadiness on feet  Abnormal posture  Other symptoms and signs involving the nervous  system     Problem List Patient Active Problem List   Diagnosis Date Noted   Partial epilepsy with impairment of consciousness ([place]) 12/03/2012   Abnormality of gait 12/03/2012   Essential and other specified forms of tremor 12/03/2012   H/O mental disorder 05/15/2011   Cerebral palsy ([place]) 05/02/2011   Benign essential tremor 05/02/2011   Bilateral polycystic ovarian syndrome 02/28/2011    Jaiona Simien W., PT 04/21/2021, 8:28 AM  [facility] [address] [place], [name], [address] Phone: [phone]   Fax:  [phone]  Name: Shannon Travis MRN: [id number] Date of Birth: [birth year]   For Recert:   PT End of Session - 04/21/21 0819     Visit Number 15    Number of Visits 23    Date for PT Re-Evaluation 05/26/21    Authorization Type Medicaid-Request additional visits after 04/21/21 visit    Authorization Time Period 3 visits 02/13/21- 03/05/21; 12 03/14/21-04/24/21    Authorization - Visit Number 10    Authorization - Number of Visits 12    PT Start Time 0719    PT Stop Time 0800    PT Time Calculation (min) 41 min    Equipment Utilized During Treatment Gait belt    Activity Tolerance Patient tolerated treatment well    Behavior During Therapy [facility] for tasks assessed/performed             Plan - 04/21/21 4431     Clinical Impression Statement Remaining LTGs assessed today, with pt meeting LTG 3, 4, 5.  Pt did not meet LTG 2 for 5x sit<>stand, but did improve that measure, just not to goal level.  Pt is making steady progress; however, pt has not yet received her rollator (awaiting insurance approval), and she benefit from additional training on outdoor gait to improve gait velocity and curb negotiation for optimal safety and independence.  She demonstrates gait speed of 1.8 ft/sec with rollator, which is significantly less than gait velocity of 3.94 ft/sec to safety cross the street).  She is motivated for  therapy, has been consistent with attendance, and will benefit from additional skilled PT; recert completed today.    Personal Factors and Comorbidities Comorbidity 2    Comorbidities PMH:  anxiety, seizure    Examination-Activity Limitations Locomotion Level;Transfers;Stairs;Stand    Examination-Participation Restrictions Community Activity;Shop    Stability/Clinical Decision Making Evolving/Moderate complexity    Rehab Potential Good    PT Frequency 2x / week    PT Duration 4 weeks   per recert 54/0/0867   PT Treatment/Interventions ADLs/Self Care Home Management;DME Instruction;Neuromuscular re-education;Balance training;Therapeutic exercise;Therapeutic activities;Functional mobility training;Stair training;Gait training;Patient/family education;Aquatic Therapy    PT Next Visit Plan Work on gait speed changes for speeding up/slowing down on outdoor surfaces, with  rollator, curb training.  Update HEP and discuss possibility for community fitness upon d/c.    Consulted and Agree with Plan of Care Patient            Mady Haagensen, PT 04/21/21 8:37 AM Phone: [phone] Fax: [phone]

## 2021-04-21 NOTE — Patient Instructions (Addendum)
It is important to avoid accidents which may result in broken bones.  Here are a few ideas on how to make your home safer so you will be less likely to trip or fall.  Use nonskid mats or non slip strips in your shower or tub, on your bathroom floor and around sinks.  If you know that you have spilled water, wipe it up! In the bathroom, it is important to have properly installed grab bars on the walls or on the edge of the tub.  Towel racks are NOT strong enough for you to hold onto or to pull on for support. Stairs and hallways should have enough light.  Add lamps or night lights if you need ore light. It is good to have handrails on both sides of the stairs if possible.  Always fix broken handrails right away. It is important to see the edges of steps.  Paint the edges of outdoor steps white so you can see them better.  Put colored tape on the edge of inside steps. Throw-rugs are dangerous because they can slide.  Removing the rugs is the best idea, but if they must stay, add adhesive carpet tape to prevent slipping. Do not keep things on stairs or in the halls.  Remove small furniture that blocks the halls as it may cause you to trip.  Keep telephone and electrical cords out of the way where you walk. Always were sturdy, rubber-soled shoes for good support.  Never wear just socks, especially on the stairs.  Socks may cause you to slip or fall.  Do not wear full-length housecoats as you can easily trip on the bottom.  Place the things you use the most on the shelves that are the easiest to reach.  If you use a stepstool, make sure it is in good condition.  If you feel unsteady, DO NOT climb, ask for help. If a health professional advises you to use a cane or walker, do not be ashamed.  These items can keep you from falling and breaking your bones.   Also-be mindful of situations that bring on increased fear:  stepping down curbs/steps, elevators, etc:  STOP, take a deep breath, visualize technique of  completing task, then complete the task successfully.  Pt asks about gait belt-can get from a [employer] store

## 2021-05-02 ENCOUNTER — Other Ambulatory Visit: Payer: Self-pay

## 2021-05-02 ENCOUNTER — Encounter: Payer: Self-pay | Admitting: Physical Therapy

## 2021-05-02 ENCOUNTER — Ambulatory Visit: Payer: Medicaid Other | Admitting: Physical Therapy

## 2021-05-02 DIAGNOSIS — M6281 Muscle weakness (generalized): Secondary | ICD-10-CM

## 2021-05-02 DIAGNOSIS — R2681 Unsteadiness on feet: Secondary | ICD-10-CM

## 2021-05-02 DIAGNOSIS — R2689 Other abnormalities of gait and mobility: Secondary | ICD-10-CM

## 2021-05-02 DIAGNOSIS — R293 Abnormal posture: Secondary | ICD-10-CM

## 2021-05-02 NOTE — Telephone Encounter (Signed)
Pt came into lobby following up to see if her office notes were faxed to [facility], we will refax today to Attn: Dusti @ [facility].

## 2021-05-02 NOTE — Therapy (Signed)
[facility] [address] [place], [place], [address] Phone: [phone]   Fax:  [phone]  Physical Therapy Treatment  Patient Details  Name: Shannon Travis MRN: [id number] Date of Birth: [birth year] Referring Provider (PT): Stephanie Acre   Encounter Date: 05/02/2021   PT End of Session - 05/02/21 0723     Visit Number 16    Number of Visits 23    Date for PT Re-Evaluation 05/26/21    Authorization Type Medicaid-Request additional visits after 04/21/21 visit    Authorization Time Period 3 visits 02/13/21- 03/05/21; 12 03/14/21-04/24/21; waiting on new auth    Authorization - Visit Number 1    PT Start Time 0719    PT Stop Time 0800    PT Time Calculation (min) 41 min    Equipment Utilized During Treatment Gait belt    Activity Tolerance Patient tolerated treatment well    Behavior During Therapy WFL for tasks assessed/performed             Past Medical History:  Diagnosis Date   Abnormality of gait    Movement disorder    Seizures ([facility])     Past Surgical History:  Procedure Laterality Date   CYSTECTOMY Left    leg    There were no vitals filed for this visit.   Subjective Assessment - 05/02/21 0722     Subjective No new compliants. No falls or pain to report. Still waiting to hear from [employer] about rollator.    Pertinent History PMH:  anxiety, seizure    How long can you stand comfortably? depends on situation; a few minutes    How long can you walk comfortably? depends, more difficulty outdoor /unlevel surfaces, thresholds    Patient Stated Goals Pt's goals for therapy are to focus on legs-get them better and stronger, help with balance, stop falling.  04/18/21:  Goal is to cross the street safely with rollator    Currently in Pain? No/denies    Pain Score 0-No pain                    [facility] Adult PT Treatment/Exercise - 05/02/21 0724       Transfers   Transfers Sit to Stand;Stand  to Sit    Sit to Stand 6: Modified independent (Device/Increase time)    Stand to Sit 6: Modified independent (Device/Increase time)      Ambulation/Gait   Ambulation/Gait Yes    Ambulation/Gait Assistance 5: Supervision;4: Min guard    Ambulation/Gait Assistance Details occasional cues to stay closer to rollator with gait. pt able to keep hands on brakes and use them as needed without cues. 1st lap working on distance. 2cd lap working on speed changes fast<>normal<>slow random times with min guard assist.    Ambulation Distance (Feet) 830 Feet   x1, 230 x1, plus around clinic with session   Assistive device Rollator    Gait Pattern Step-through pattern;Decreased arm swing - left;Ataxic;Wide base of support    Ambulation Surface Level;Indoor      High Level Balance   High Level Balance Activities Negotitating around obstacles    High Level Balance Comments weaving around 6 cones in a row with 180* turns at the end for 4 laps with min guard assist, pt needing to slow down at times to avoid hitting the cones.      Self-Care   Self-Care Other Self-Care Comments    Other Self-Care Comments  pt going to [facility] currently to assist with weight loss. Has a nutritionist/dietician she see's there. Also has access to a gym through them to continue with fitness with her trainer Shawn at times and on her own other times (will have set visits with Shawn through [facility] and able to go on her own other times).      Knee/Hip Exercises: Aerobic   Other Aerobic Scifit UE/LE's level 3.5  x 8 mintues with goal >/= 70 steps per minute for strengthening and activity tolerance.                       PT Short Term Goals - 04/21/21 0829       PT SHORT TERM GOAL #1   Title STGs = LTGs               PT Long Term Goals - 04/21/21 0829       PT LONG TERM GOAL #1   Title Pt will perform final updates for HEP for improved strength, transfers, balance, gait.  TARGET 05/26/21 (pt  unavailable 1st wk of Nov)    Baseline 02/22/21: had updated HEP from clinic, will benefit from further updating as pt progresses; independent with HEP updates 04/18/2021    Time 5    Period Weeks    Status Revised      PT LONG TERM GOAL #2   Title Pt will improve 5x sit<>stand to less than or equal to 11.5 sec to demonstrate improved functional strength and transfer efficiency.    Baseline 02/22/21: 12.82 sec's no UE support from standard height surface; 04/21/21:  12.16 sec    Time 4    Period Weeks    Status On-going      PT LONG TERM GOAL #3   Title Pt will negotiate outdoor curb using rollator, supervision only, for improved outdoor gait negotiation.    Baseline min assist and cues 04/21/2021    Time 4    Period Weeks    Status New      PT LONG TERM GOAL #4   Title Pt will improve gait velocity to at least 2.5 ft/sec with rollator, for improved gait efficiency and safety in community.    Baseline 02/22/21: 2.08 ft/sec's with rollator; 04/18/21:  2.45 ft/sec with cane,  1.81 ft/sec with rollator    Time 4    Period Weeks    Status Revised      PT LONG TERM GOAL #5   Title Pt will verbalize plans for continued community fitness to maximize gains from PT upon discharge.    Baseline no current fitness plans    Time 4    Period Weeks    Status New                   Plan - 05/02/21 0723     Clinical Impression Statement Today's skilled session continued to focus on use of rollator for gait and balance training with rest breaks taken as needed. The pt is making progress toward goals and should benefit from continued PT to progress toward unmet goals.    Personal Factors and Comorbidities Comorbidity 2    Comorbidities PMH:  anxiety, seizure    Examination-Activity Limitations Locomotion Level;Transfers;Stairs;Stand    Examination-Participation Restrictions Community Activity;Shop    Stability/Clinical Decision Making Evolving/Moderate complexity    Rehab Potential Good     PT Frequency 2x / week    PT Duration 4 weeks   per recert 04/21/2021  PT Treatment/Interventions ADLs/Self Care Home Management;DME Instruction;Neuromuscular re-education;Balance training;Therapeutic exercise;Therapeutic activities;Functional mobility training;Stair training;Gait training;Patient/family education;Aquatic Therapy    PT Next Visit Plan Work on gait speed changes for speeding up/slowing down on outdoor surfaces, with rollator, curb training.  Update HEP and continue to encourage community fitness with [employer] [facility] once discharged from PT.    Consulted and Agree with Plan of Care Patient             Patient will benefit from skilled therapeutic intervention in order to improve the following deficits and impairments:  Abnormal gait, Difficulty walking, Impaired tone, Decreased balance, Decreased mobility, Decreased strength, Postural dysfunction  Visit Diagnosis: Other abnormalities of gait and mobility  Muscle weakness (generalized)  Unsteadiness on feet  Abnormal posture     Problem List Patient Active Problem List   Diagnosis Date Noted   Partial epilepsy with impairment of consciousness ([facility]) 12/03/2012   Abnormality of gait 12/03/2012   Essential and other specified forms of tremor 12/03/2012   H/O mental disorder 05/15/2011   Cerebral palsy ([facility]) 05/02/2011   Benign essential tremor 05/02/2011   Bilateral polycystic ovarian syndrome 02/28/2011    Sallyanne Kuster, PTA, [facility] [address], [address] [place], [place] [address] [phone] 05/02/21, 8:54 PM   Name: Shannon Travis MRN: [id number] Date of Birth: [birth year]

## 2021-05-05 ENCOUNTER — Encounter: Payer: Self-pay | Admitting: Physical Therapy

## 2021-05-05 ENCOUNTER — Ambulatory Visit: Payer: Medicaid Other | Admitting: Physical Therapy

## 2021-05-05 ENCOUNTER — Other Ambulatory Visit: Payer: Self-pay

## 2021-05-05 DIAGNOSIS — R2681 Unsteadiness on feet: Secondary | ICD-10-CM

## 2021-05-05 DIAGNOSIS — R2689 Other abnormalities of gait and mobility: Secondary | ICD-10-CM

## 2021-05-05 DIAGNOSIS — R293 Abnormal posture: Secondary | ICD-10-CM

## 2021-05-05 DIAGNOSIS — M6281 Muscle weakness (generalized): Secondary | ICD-10-CM

## 2021-05-05 NOTE — Therapy (Signed)
[facility] [address] [place], [place], [address] Phone: [phone]   Fax:  [phone]  Physical Therapy Treatment  Patient Details  Name: Shannon Travis MRN: [id number] Date of Birth: [birth year] Referring Provider (PT): Stephanie Acre   Encounter Date: 05/05/2021   PT End of Session - 05/05/21 0723     Visit Number 17    Number of Visits 23    Date for PT Re-Evaluation 05/26/21    Authorization Type Medicaid-Request additional visits after 04/21/21 visit    Authorization Time Period 3 visits 02/13/21- 03/05/21; 12 03/14/21-04/24/21; 8 visits 10/18-11/14/22    Authorization - Visit Number 2    Authorization - Number of Visits 8    PT Start Time 0719    PT Stop Time 0800    PT Time Calculation (min) 41 min    Equipment Utilized During Treatment Gait belt    Activity Tolerance Patient tolerated treatment well    Behavior During Therapy WFL for tasks assessed/performed             Past Medical History:  Diagnosis Date   Abnormality of gait    Movement disorder    Seizures (HCC)     Past Surgical History:  Procedure Laterality Date   CYSTECTOMY Left    leg    There were no vitals filed for this visit.   Subjective Assessment - 05/05/21 0721     Subjective No new complaints. No falls. Heard back from [employer] yesterday and goes to get her rollator Monday.    Pertinent History PMH:  anxiety, seizure    How long can you stand comfortably? depends on situation; a few minutes    How long can you walk comfortably? depends, more difficulty outdoor /unlevel surfaces, thresholds    Patient Stated Goals Pt's goals for therapy are to focus on legs-get them better and stronger, help with balance, stop falling.  04/18/21:  Goal is to cross the street safely with rollator    Currently in Pain? No/denies    Pain Score 0-No pain                  [facility] Adult PT Treatment/Exercise - 05/05/21 0723        Transfers   Transfers Sit to Stand;Stand to Sit    Sit to Stand 6: Modified independent (Device/Increase time)    Stand to Sit 6: Modified independent (Device/Increase time)      Ambulation/Gait   Ambulation/Gait Yes    Ambulation/Gait Assistance 5: Supervision    Ambulation/Gait Assistance Details pt with occasional kicking of feet on rollator wheels with turning, self corrected any balance issue when this occured.    Ambulation Distance (Feet) 850 Feet   x1   Assistive device Rollator    Gait Pattern Step-through pattern;Decreased arm swing - left;Ataxic;Wide base of support    Ambulation Surface Level;Indoor      Knee/Hip Exercises: Aerobic   Other Aerobic Scifit UE/LE's level 4.0 x 8 mintues with goal >/= 70 steps per minute for strengthening and activity tolerance.                 Balance Exercises - 05/05/21 0746       Balance Exercises: Standing   Standing Eyes Closed Foam/compliant surface;Narrow base of support (BOS);Wide (BOA);Head turns;Other reps (comment);30 secs;Limitations    Standing Eyes Closed Limitations on airex with no UE suppport: narrow base of support for EC 30 sec's x 3 reps, then in wider  base of support for EC head movements left<>right, up<>down for ~10 reps each. min guard to min assist with cues on posture and weight shifting to assist balance.    Sit to Stand Standard surface;Without upper extremity support;Foam/compliant surface;Limitations    Sit to Stand Limitations seated with feet on blue airex: sit<>stands with hands on thighs for 10 reps with cues for tall standing and controlled descent. min guard assist                  PT Short Term Goals - 04/21/21 0829       PT SHORT TERM GOAL #1   Title STGs = LTGs               PT Long Term Goals - 04/21/21 0829       PT LONG TERM GOAL #1   Title Pt will perform final updates for HEP for improved strength, transfers, balance, gait.  TARGET 05/26/21 (pt unavailable 1st wk of  Nov)    Baseline 02/22/21: had updated HEP from clinic, will benefit from further updating as pt progresses; independent with HEP updates 04/18/2021    Time 5    Period Weeks    Status Revised      PT LONG TERM GOAL #2   Title Pt will improve 5x sit<>stand to less than or equal to 11.5 sec to demonstrate improved functional strength and transfer efficiency.    Baseline 02/22/21: 12.82 sec's no UE support from standard height surface; 04/21/21:  12.16 sec    Time 4    Period Weeks    Status On-going      PT LONG TERM GOAL #3   Title Pt will negotiate outdoor curb using rollator, supervision only, for improved outdoor gait negotiation.    Baseline min assist and cues 04/21/2021    Time 4    Period Weeks    Status New      PT LONG TERM GOAL #4   Title Pt will improve gait velocity to at least 2.5 ft/sec with rollator, for improved gait efficiency and safety in community.    Baseline 02/22/21: 2.08 ft/sec's with rollator; 04/18/21:  2.45 ft/sec with cane,  1.81 ft/sec with rollator    Time 4    Period Weeks    Status Revised      PT LONG TERM GOAL #5   Title Pt will verbalize plans for continued community fitness to maximize gains from PT upon discharge.    Baseline no current fitness plans    Time 4    Period Weeks    Status New                   Plan - 05/05/21 0723     Clinical Impression Statement Today's skilled session continued to focus on rollator use, strengthening and balance training with rest breaks taken as needed. No other issues were repored or noted in session. The pt is making steady progress toward goals and should benefit from continued PT to progress toward unmet goals.    Personal Factors and Comorbidities Comorbidity 2    Comorbidities PMH:  anxiety, seizure    Examination-Activity Limitations Locomotion Level;Transfers;Stairs;Stand    Examination-Participation Restrictions Community Activity;Shop    Stability/Clinical Decision Making Evolving/Moderate  complexity    Rehab Potential Good    PT Frequency 2x / week    PT Duration 4 weeks   per recert 04/21/2021   PT Treatment/Interventions ADLs/Self Care Home Management;DME Instruction;Neuromuscular re-education;Balance training;Therapeutic exercise;Therapeutic  activities;Functional mobility training;Stair training;Gait training;Patient/family education;Aquatic Therapy    PT Next Visit Plan Work on gait speed changes for speeding up/slowing down on outdoor surfaces, with rollator, curb training.  Update HEP and continue to encourage community fitness with [employer] once discharged from PT.    Consulted and Agree with Plan of Care Patient             Patient will benefit from skilled therapeutic intervention in order to improve the following deficits and impairments:  Abnormal gait, Difficulty walking, Impaired tone, Decreased balance, Decreased mobility, Decreased strength, Postural dysfunction  Visit Diagnosis: Other abnormalities of gait and mobility  Muscle weakness (generalized)  Unsteadiness on feet  Abnormal posture     Problem List Patient Active Problem List   Diagnosis Date Noted   Partial epilepsy with impairment of consciousness (HCC) 12/03/2012   Abnormality of gait 12/03/2012   Essential and other specified forms of tremor 12/03/2012   H/O mental disorder 05/15/2011   Cerebral palsy (HCC) 05/02/2011   Benign essential tremor 05/02/2011   Bilateral polycystic ovarian syndrome 02/28/2011   Sallyanne Kuster, PTA, [facility] [address], [address] [place], [place] [address] [phone] 05/05/21, 10:03 PM   Name: Shannon Travis MRN: [id number] Date of Birth: [birth year]

## 2021-05-08 ENCOUNTER — Ambulatory Visit: Payer: Medicaid Other | Admitting: Neurology

## 2021-05-09 ENCOUNTER — Encounter: Payer: Self-pay | Admitting: Physical Therapy

## 2021-05-09 ENCOUNTER — Other Ambulatory Visit: Payer: Self-pay

## 2021-05-09 ENCOUNTER — Ambulatory Visit: Payer: Medicaid Other | Admitting: Physical Therapy

## 2021-05-09 ENCOUNTER — Other Ambulatory Visit: Payer: Self-pay | Admitting: Neurology

## 2021-05-09 DIAGNOSIS — R2681 Unsteadiness on feet: Secondary | ICD-10-CM

## 2021-05-09 DIAGNOSIS — R2689 Other abnormalities of gait and mobility: Secondary | ICD-10-CM

## 2021-05-09 DIAGNOSIS — M6281 Muscle weakness (generalized): Secondary | ICD-10-CM

## 2021-05-09 DIAGNOSIS — R293 Abnormal posture: Secondary | ICD-10-CM

## 2021-05-10 NOTE — Therapy (Signed)
[facility] [address] [place], [place], [address] Phone: [phone]   Fax:  [phone]  Physical Therapy Treatment  Patient Details  Name: Shannon Travis MRN: [id number] Date of Birth: 1986-06-12 Referring Provider (PT): Stephanie Acre   Encounter Date: 05/09/2021   PT End of Session - 05/09/21 0730     Visit Number 18    Number of Visits 23    Date for PT Re-Evaluation 05/26/21    Authorization Type Medicaid-Request additional visits after 04/21/21 visit    Authorization Time Period 3 visits 02/13/21- 03/05/21; 12 03/14/21-04/24/21; 8 visits 10/18-11/14/22    Authorization - Visit Number 3    Authorization - Number of Visits 8    PT Start Time 0719    PT Stop Time 0800    PT Time Calculation (min) 41 min    Equipment Utilized During Treatment Gait belt    Activity Tolerance Patient tolerated treatment well    Behavior During Therapy WFL for tasks assessed/performed             Past Medical History:  Diagnosis Date   Abnormality of gait    Movement disorder    Seizures (HCC)     Past Surgical History:  Procedure Laterality Date   CYSTECTOMY Left    leg    There were no vitals filed for this visit.   Subjective Assessment - 05/09/21 0729     Subjective No new complaints. No falls. Has new rollator with her today.    Pertinent History PMH:  anxiety, seizure    How long can you stand comfortably? depends on situation; a few minutes    How long can you walk comfortably? depends, more difficulty outdoor /unlevel surfaces, thresholds    Patient Stated Goals Pt's goals for therapy are to focus on legs-get them better and stronger, help with balance, stop falling.  04/18/21:  Goal is to cross the street safely with rollator    Currently in Pain? No/denies    Pain Score 0-No pain                     [facility] Adult PT Treatment/Exercise - 05/09/21 0730       Transfers   Transfers Sit to  Stand;Stand to Sit    Sit to Stand 6: Modified independent (Device/Increase time)    Stand to Sit 6: Modified independent (Device/Increase time)      Ambulation/Gait   Ambulation/Gait Yes    Ambulation/Gait Assistance 5: Supervision    Ambulation Distance (Feet) 1000 Feet   x1, plus around clinic with session   Assistive device Rollator    Gait Pattern Step-through pattern;Decreased arm swing - left;Ataxic;Wide base of support    Ambulation Surface Level;Indoor      Knee/Hip Exercises: Aerobic   Other Aerobic Scifit UE/LE's level 4.0 x 8 mintues with goal >/= 70 steps per minute for strengthening and activity tolerance.                 Balance Exercises - 05/09/21 0738       Balance Exercises: Standing   Rockerboard Anterior/posterior;EO;EC;30 seconds;Other reps (comment);Limitations;UE support    Rockerboard Limitations on balance board in anterior/posterior directions: with light UE support progressing to no UE support for rocking the board with EO, progressing to EC emphasis on tall posture and weight shfiting. then holding the board steady: no UE support to occasional touch to bars 30 sec's x 3 reps. min guard to min  assist for balance. cues on posture/weight shifitng to assist with balance.    Balance Beam standing across blue foam beam: alternating forward heel taps to floor/back onto beam, then alternating backward toe taps to floor/back onto beam for ~10 reps each, bil UE support. cues on posture and step length.    Sidestepping Foam/compliant support;Upper extremity support;3 reps;Limitations    Sidestepping Limitations on blue foam beam for 4 laps toward each direction with light touch on bars to none at times. cues on posture and step length. min guard to min assist for balance.                  PT Short Term Goals - 04/21/21 0829       PT SHORT TERM GOAL #1   Title STGs = LTGs               PT Long Term Goals - 04/21/21 0829       PT LONG TERM  GOAL #1   Title Pt will perform final updates for HEP for improved strength, transfers, balance, gait.  TARGET 05/26/21 (pt unavailable 1st wk of Nov)    Baseline 02/22/21: had updated HEP from clinic, will benefit from further updating as pt progresses; independent with HEP updates 04/18/2021    Time 5    Period Weeks    Status Revised      PT LONG TERM GOAL #2   Title Pt will improve 5x sit<>stand to less than or equal to 11.5 sec to demonstrate improved functional strength and transfer efficiency.    Baseline 02/22/21: 12.82 sec's no UE support from standard height surface; 04/21/21:  12.16 sec    Time 4    Period Weeks    Status On-going      PT LONG TERM GOAL #3   Title Pt will negotiate outdoor curb using rollator, supervision only, for improved outdoor gait negotiation.    Baseline min assist and cues 04/21/2021    Time 4    Period Weeks    Status New      PT LONG TERM GOAL #4   Title Pt will improve gait velocity to at least 2.5 ft/sec with rollator, for improved gait efficiency and safety in community.    Baseline 02/22/21: 2.08 ft/sec's with rollator; 04/18/21:  2.45 ft/sec with cane,  1.81 ft/sec with rollator    Time 4    Period Weeks    Status Revised      PT LONG TERM GOAL #5   Title Pt will verbalize plans for continued community fitness to maximize gains from PT upon discharge.    Baseline no current fitness plans    Time 4    Period Weeks    Status New                   Plan - 05/09/21 0730     Clinical Impression Statement Today's skilled session focused on use of pt's personal rollator with gait in session with no issues noted or reported. Remainder of session continued to focus on strengthening and balance training with rest breaks taken as needed. No other issues noted or reported in session. The pt is progressing toward goals and should benefit from continued PT to progress toward unmet goals.    Personal Factors and Comorbidities Comorbidity 2     Comorbidities PMH:  anxiety, seizure    Examination-Activity Limitations Locomotion Level;Transfers;Stairs;Stand    Examination-Participation Restrictions Community Activity;Shop    Stability/Clinical Decision Making  Evolving/Moderate complexity    Rehab Potential Good    PT Frequency 2x / week    PT Duration 4 weeks   per recert 04/21/2021   PT Treatment/Interventions ADLs/Self Care Home Management;DME Instruction;Neuromuscular re-education;Balance training;Therapeutic exercise;Therapeutic activities;Functional mobility training;Stair training;Gait training;Patient/family education;Aquatic Therapy    PT Next Visit Plan Work on gait speed changes for speeding up/slowing down on outdoor surfaces, with rollator, curb training.  Update HEP and continue to encourage community fitness with [employer] once discharged from PT.    Consulted and Agree with Plan of Care Patient             Patient will benefit from skilled therapeutic intervention in order to improve the following deficits and impairments:  Abnormal gait, Difficulty walking, Impaired tone, Decreased balance, Decreased mobility, Decreased strength, Postural dysfunction  Visit Diagnosis: Other abnormalities of gait and mobility  Muscle weakness (generalized)  Unsteadiness on feet  Abnormal posture     Problem List Patient Active Problem List   Diagnosis Date Noted   Partial epilepsy with impairment of consciousness (HCC) 12/03/2012   Abnormality of gait 12/03/2012   Essential and other specified forms of tremor 12/03/2012   H/O mental disorder 05/15/2011   Cerebral palsy (HCC) 05/02/2011   Benign essential tremor 05/02/2011   Bilateral polycystic ovarian syndrome 02/28/2011    Sallyanne Kuster, PTA, [facility] [address], [address] [place], [place] [address] [phone] 05/10/21, 12:56 PM   Name: Shannon Travis MRN: [id number] Date of Birth: January 29, 1986

## 2021-05-12 ENCOUNTER — Other Ambulatory Visit: Payer: Self-pay

## 2021-05-12 ENCOUNTER — Ambulatory Visit: Payer: Medicaid Other | Admitting: Physical Therapy

## 2021-05-12 ENCOUNTER — Encounter: Payer: Self-pay | Admitting: Physical Therapy

## 2021-05-12 DIAGNOSIS — R2689 Other abnormalities of gait and mobility: Secondary | ICD-10-CM

## 2021-05-12 DIAGNOSIS — M6281 Muscle weakness (generalized): Secondary | ICD-10-CM

## 2021-05-12 DIAGNOSIS — R2681 Unsteadiness on feet: Secondary | ICD-10-CM

## 2021-05-12 NOTE — Therapy (Addendum)
[facility] [address] [place], [place], [address] Phone: [phone]   Fax:  [phone]  Physical Therapy Treatment  Patient Details  Name: Shannon Travis MRN: [id number] Date of Birth: [birth year] Referring Provider (PT): Stephanie Acre   Encounter Date: 05/12/2021   PT End of Session - 05/12/21 0720     Visit Number 19    Number of Visits 23    Date for PT Re-Evaluation 05/26/21    Authorization Type Medicaid-Request additional visits after 04/21/21 visit    Authorization Time Period 3 visits 02/13/21- 03/05/21; 12 03/14/21-04/24/21; 8 visits 10/18-11/14/22    Authorization - Visit Number 4    Authorization - Number of Visits 8    PT Start Time 0719    PT Stop Time 0800    PT Time Calculation (min) 41 min    Equipment Utilized During Treatment Gait belt    Activity Tolerance Patient tolerated treatment well    Behavior During Therapy WFL for tasks assessed/performed             Past Medical History:  Diagnosis Date   Abnormality of gait    Movement disorder    Seizures (HCC)     Past Surgical History:  Procedure Laterality Date   CYSTECTOMY Left    leg    There were no vitals filed for this visit.   Subjective Assessment - 05/12/21 0721     Subjective Only uses her rollator coming to therapy or going out to the mailbox.    Pertinent History PMH:  anxiety, seizure    How long can you stand comfortably? depends on situation; a few minutes    How long can you walk comfortably? depends, more difficulty outdoor /unlevel surfaces, thresholds    Patient Stated Goals Pt's goals for therapy are to focus on legs-get them better and stronger, help with balance, stop falling.  04/18/21:  Goal is to cross the street safely with rollator    Currently in Pain? No/denies                               [facility] Adult PT Treatment/Exercise - 05/12/21 0722       Transfers   Transfers Sit to  Stand;Stand to Sit    Sit to Stand 5: Supervision    Stand to Sit 5: Supervision    Comments Cues to lock rollator brakes prior to sit <> stands      Ambulation/Gait   Ambulation/Gait Yes    Ambulation/Gait Assistance 5: Supervision    Ambulation/Gait Assistance Details Working on speeding up and slowing down gait speed with rollator, then narrow turns around cones, takes incr time with rollator management initially, but improves with incr practice. Cues to stay closer to rollator with obstacles.    Ambulation Distance (Feet) 345 Feet    Assistive device Rollator    Gait Pattern Step-through pattern;Decreased arm swing - left;Ataxic;Wide base of support    Ambulation Surface Level;Indoor    Gait Comments Unable to try outdoor gait today due to it being dark/cold outside.      Exercises   Exercises Other Exercises    Other Exercises  After exercises on rockerboard pt reporting incr tightness in RLE,performed seated hamstring stretch 2 x 30 seconds with pt reporting relief afterwards.      Knee/Hip Exercises: Aerobic   Other Aerobic Scifit UE/LE's level 4.0 x 8 mintues with goal >/= 70  steps per minute for strengthening and activity tolerance.                 Balance Exercises - 05/12/21 0745       Balance Exercises: Standing   SLS with Vectors Upper extremity assist 2;Foam/compliant surface    SLS with Vectors Limitations On blue mat - forward and then cross body cone tap x10 reps each leg    Rockerboard Anterior/posterior;EO;EC;30 seconds;Other reps (comment);Limitations;UE support    Rockerboard Limitations in A/P direction: weight shifting x12 reps with UE support > none. Alternating UE lifts x8 reps each side, trunk rotations and tapping cones x5 reps each side    Other Standing Exercises On blue mat: forwards/retro marching x3 reps each with no UE support, cues for slowed SLS                  PT Short Term Goals - 04/21/21 0829       PT SHORT TERM GOAL #1    Title STGs = LTGs               PT Long Term Goals - 04/21/21 0829       PT LONG TERM GOAL #1   Title Pt will perform final updates for HEP for improved strength, transfers, balance, gait.  TARGET 05/26/21 (pt unavailable 1st wk of Nov)    Baseline 02/22/21: had updated HEP from clinic, will benefit from further updating as pt progresses; independent with HEP updates 04/18/2021    Time 5    Period Weeks    Status Revised      PT LONG TERM GOAL #2   Title Pt will improve 5x sit<>stand to less than or equal to 11.5 sec to demonstrate improved functional strength and transfer efficiency.    Baseline 02/22/21: 12.82 sec's no UE support from standard height surface; 04/21/21:  12.16 sec    Time 4    Period Weeks    Status On-going      PT LONG TERM GOAL #3   Title Pt will negotiate outdoor curb using rollator, supervision only, for improved outdoor gait negotiation.    Baseline min assist and cues 04/21/2021    Time 4    Period Weeks    Status New      PT LONG TERM GOAL #4   Title Pt will improve gait velocity to at least 2.5 ft/sec with rollator, for improved gait efficiency and safety in community.    Baseline 02/22/21: 2.08 ft/sec's with rollator; 04/18/21:  2.45 ft/sec with cane,  1.81 ft/sec with rollator    Time 4    Period Weeks    Status Revised      PT LONG TERM GOAL #5   Title Pt will verbalize plans for continued community fitness to maximize gains from PT upon discharge.    Baseline no current fitness plans    Time 4    Period Weeks    Status New                   Plan - 05/12/21 1150     Clinical Impression Statement Continued to practice with rollator during session working on speed changes and navigating obstacles. Pt needing cues to stay closer to rollator when performing turns around obstacles. Remainder of session focused on BLE strengthening and balance strategies on unlevel surfaces. Will continue to progress towards LTGs.    Personal Factors and  Comorbidities Comorbidity 2    Comorbidities PMH:  anxiety,  seizure    Examination-Activity Limitations Locomotion Level;Transfers;Stairs;Stand    Examination-Participation Restrictions Community Activity;Shop    Stability/Clinical Decision Making Evolving/Moderate complexity    Rehab Potential Good    PT Frequency 2x / week    PT Duration 4 weeks   per recert 04/21/2021   PT Treatment/Interventions ADLs/Self Care Home Management;DME Instruction;Neuromuscular re-education;Balance training;Therapeutic exercise;Therapeutic activities;Functional mobility training;Stair training;Gait training;Patient/family education;Aquatic Therapy    PT Next Visit Plan Work on gait speed changes for speeding up/slowing down on outdoor surfaces, with rollator, curb training.  Update HEP and continue to encourage community fitness with [employer] once discharged from PT.    Consulted and Agree with Plan of Care Patient             Patient will benefit from skilled therapeutic intervention in order to improve the following deficits and impairments:  Abnormal gait, Difficulty walking, Impaired tone, Decreased balance, Decreased mobility, Decreased strength, Postural dysfunction  Visit Diagnosis: Other abnormalities of gait and mobility  Unsteadiness on feet  Muscle weakness (generalized)     Problem List Patient Active Problem List   Diagnosis Date Noted   Partial epilepsy with impairment of consciousness (HCC) 12/03/2012   Abnormality of gait 12/03/2012   Essential and other specified forms of tremor 12/03/2012   H/O mental disorder 05/15/2011   Cerebral palsy (HCC) 05/02/2011   Benign essential tremor 05/02/2011   Bilateral polycystic ovarian syndrome 02/28/2011    Drake Leach, PT, DPT 05/12/2021, 11:51 AM  [facility] [address] [place], [place], [address] Phone: [phone]   Fax:  [phone]  Name: Shannon Travis MRN: [id number] Date of Birth: [birth year]

## 2021-05-23 ENCOUNTER — Ambulatory Visit: Payer: Medicaid Other | Admitting: Physical Therapy

## 2021-05-26 ENCOUNTER — Encounter: Payer: Self-pay | Admitting: Physical Therapy

## 2021-05-26 ENCOUNTER — Ambulatory Visit: Payer: Medicaid Other | Attending: Neurology | Admitting: Physical Therapy

## 2021-05-26 ENCOUNTER — Other Ambulatory Visit: Payer: Self-pay

## 2021-05-26 DIAGNOSIS — M6281 Muscle weakness (generalized): Secondary | ICD-10-CM

## 2021-05-26 DIAGNOSIS — R2689 Other abnormalities of gait and mobility: Secondary | ICD-10-CM

## 2021-05-26 DIAGNOSIS — R2681 Unsteadiness on feet: Secondary | ICD-10-CM

## 2021-05-26 NOTE — Therapy (Addendum)
[facility] [address] [place], [name], [address] Phone: [phone]   Fax:  [phone]  Physical Therapy Treatment/Discharge Summary  Patient Details  Name: Shannon Travis MRN: [id number] Date of Birth: March 05, 1986 Referring Provider (PT): Margette Fast   Encounter Date: 05/26/2021   PT End of Session - 05/26/21 0723     Visit Number 20    Number of Visits 23    Date for PT Re-Evaluation 05/26/21    Authorization Type Medicaid-Request additional visits after 04/21/21 visit    Authorization Time Period 3 visits 02/13/21- 03/05/21; 12 03/14/21-04/24/21; 8 visits 10/18-11/14/22    Authorization - Visit Number 5    Authorization - Number of Visits 8    PT Start Time 0720    PT Stop Time 0800    PT Time Calculation (min) 40 min    Equipment Utilized During Treatment Gait belt    Activity Tolerance Patient tolerated treatment well    Behavior During Therapy WFL for tasks assessed/performed             Past Medical History:  Diagnosis Date   Abnormality of gait    Movement disorder    Seizures ([place])     Past Surgical History:  Procedure Laterality Date   CYSTECTOMY Left    leg    There were no vitals filed for this visit.   Subjective Assessment - 05/26/21 0722     Subjective No new complaints. No falls or pain to report.    Pertinent History PMH:  anxiety, seizure    How long can you stand comfortably? depends on situation; a few minutes    How long can you walk comfortably? depends, more difficulty outdoor /unlevel surfaces, thresholds    Currently in Pain? No/denies    Pain Score 0-No pain                    [facility] Adult PT Treatment/Exercise - 05/26/21 0727       Transfers   Transfers Sit to Stand;Stand to Sit    Sit to Stand 5: Supervision    Five time sit to stand comments  12.19 sec's no UE support from standard height chair    Stand to Sit 5: Supervision       Ambulation/Gait   Ambulation/Gait Yes    Ambulation/Gait Assistance 6: Modified independent (Device/Increase time)    Ambulation Distance (Feet) --   >/= 1000 feet   Assistive device Rollator    Gait Pattern Step-through pattern;Decreased arm swing - left;Ataxic;Wide base of support    Ambulation Surface Level;Indoor    Gait velocity 11.78 sec's= 2.78 ft/sec with rollator      Exercises   Exercises Other Exercises    Other Exercises  reviewed HEP with no issues noted or reported with performance in session today.                 Balance Exercises - 05/26/21 0747       Balance Exercises: Standing   Rockerboard Anterior/posterior;EO;EC;30 seconds;Other reps (comment);Intermittent UE support    Rockerboard Limitations rocking the board with emphasis on tall posture with EO, progressing to EC min guard to min assist for balance. then holding the board steady for EC 30 sec's x 3 reps, min guard to min assist for balance. light touch to bars as needed for balance.                  PT Short Term Goals -  04/21/21 0829       PT SHORT TERM GOAL #1   Title STGs = LTGs               PT Long Term Goals - 05/26/21 0725       PT LONG TERM GOAL #1   Title Pt will perform final updates for HEP for improved strength, transfers, balance, gait.  TARGET 05/26/21 (pt unavailable 1st wk of Nov)    Baseline 05/26/21: met with curent HEP    Time --    Period --    Status Achieved      PT LONG TERM GOAL #2   Title Pt will improve 5x sit<>stand to less than or equal to 11.5 sec to demonstrate improved functional strength and transfer efficiency.    Baseline 05/26/21: 12.19 sec's no UE support from standard height surface, increased from last assessment of 12.16 sec's    Time --    Period --    Status Not Met      PT LONG TERM GOAL #3   Title Pt will negotiate outdoor curb using rollator, supervision only, for improved outdoor gait negotiation.    Baseline 05/26/21: unable  to assess due to incliment weather, patient reports no issues    Time --    Period --    Status Unable to assess      PT LONG TERM GOAL #4   Title Pt will improve gait velocity to at least 2.5 ft/sec with rollator, for improved gait efficiency and safety in community.    Baseline 05/26/21: 2.78 ft/sec with rollator    Time --    Period --    Status Achieved      PT LONG TERM GOAL #5   Title Pt will verbalize plans for continued community fitness to maximize gains from PT upon discharge.    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   Remaining deficits: Impaired balance, gait abnormalities, decr endurance, decr strength.    Education / Equipment: HEP   Patient agrees to discharge. Patient goals were partially met. Patient is being discharged due to maximized rehab potential.        Plan - 05/26/21 0723     Clinical Impression Statement Today's skilled session initially focused on progress toward LTGs with pt meeting her HEP, community fitness and gait speed goals. Remaining goals partially to not met. Remainder of session continued to focus on balance tranining with no issues noted or reported by patient.    Personal Factors and Comorbidities Comorbidity 2    Comorbidities PMH:  anxiety, seizure    Examination-Activity Limitations Locomotion Level;Transfers;Stairs;Stand    Examination-Participation Restrictions Community Activity;Shop    Stability/Clinical Decision Making Evolving/Moderate complexity    Rehab Potential Good    PT Frequency 2x / week    PT Duration 4 weeks   per recert 18/02/6772   PT Treatment/Interventions ADLs/Self Care Home Management;DME Instruction;Neuromuscular re-education;Balance training;Therapeutic exercise;Therapeutic activities;Functional  mobility training;Stair training;Gait training;Patient/family education;Aquatic Therapy    PT Next Visit Plan discharge today per plan of care    PT Home Exercise Plan Access Code: [email or web address]    Consulted and Agree with Plan of Care Patient  Patient will benefit from skilled therapeutic intervention in order to improve the following deficits and impairments:  Abnormal gait, Difficulty walking, Impaired tone, Decreased balance, Decreased mobility, Decreased strength, Postural dysfunction  Visit Diagnosis: Other abnormalities of gait and mobility  Unsteadiness on feet  Muscle weakness (generalized)     Problem List Patient Active Problem List   Diagnosis Date Noted   Partial epilepsy with impairment of consciousness ([place]) 12/03/2012   Abnormality of gait 12/03/2012   Essential and other specified forms of tremor 12/03/2012   H/O mental disorder 05/15/2011   Cerebral palsy ([place]) 05/02/2011   Benign essential tremor 05/02/2011   Bilateral polycystic ovarian syndrome 02/28/2011    Willow Ora, PTA, [facility] [address], [place], [place] [address] [phone] 05/26/21, 11:45 AM   Name: URANIA PEARLMAN MRN: [id number] Date of Birth: 09/11/1985    Janann August, PT, DPT 05/29/21 9:28 AM

## 2021-05-29 ENCOUNTER — Ambulatory Visit: Payer: Medicaid Other | Admitting: Physical Therapy

## 2021-08-02 ENCOUNTER — Encounter: Payer: Self-pay | Admitting: Neurology

## 2021-08-02 ENCOUNTER — Ambulatory Visit: Payer: Medicaid Other | Admitting: Neurology

## 2021-08-02 VITALS — BP 117/80 | HR 94 | Ht 67.0 in | Wt 298.5 lb

## 2021-08-02 DIAGNOSIS — R269 Unspecified abnormalities of gait and mobility: Secondary | ICD-10-CM

## 2021-08-02 DIAGNOSIS — G809 Cerebral palsy, unspecified: Secondary | ICD-10-CM

## 2021-08-02 DIAGNOSIS — G40209 Localization-related (focal) (partial) symptomatic epilepsy and epileptic syndromes with complex partial seizures, not intractable, without status epilepticus: Secondary | ICD-10-CM

## 2021-08-02 MED ORDER — FLUOXETINE HCL 10 MG PO CAPS: ORAL_CAPSULE | ORAL | 3 refills | Status: DC

## 2021-08-02 NOTE — Patient Instructions (Addendum)
Great to see you today! Will restart the Prozac 10 mg:  Take one capsule a day for one week, then take 2 capsules a day for one week, then take 3 capsules a day, once you finish this, let me know and I will send in the 40 mg tablet Continue the Keppra Call for any issues or seizures See you back in 6 months with Dr. Teresa Coombs

## 2021-08-02 NOTE — Progress Notes (Signed)
PATIENT: Shannon Travis DOB: [birth year]  REASON FOR VISIT: Follow up for cerebral palsy, gait disorder, seizures HISTORY FROM: Patient and her mother PRIMARY NEUROLOGIST: Dr. Teresa Coombs since Dr. Anne Hahn retired   HISTORY OF PRESENT ILLNESS: Today 08/02/21 Shannon Travis is here today with follow-up history of cerebral palsy, gait disorder, seizures.  On Keppra.  At last visit Prozac was added to help with anxiety, generalized tremor events during pain or being emotionally upset. Claims took for some time the titration, then stopped. Doesn't remember if it helped. Anxiety continues, especially with ambulation in public places. Completed PT, it helped with gait, has a rolling walker now, uses as needed. Mostly uses during times of known anxiety with ambulation. No seizures (described as staring spells). Taking metformin to help with weight loss. No falls. Tremor in both hands, more in the right, mild in the head. Doing well, here today with her mom.   HISTORY  01/18/2021 Dr. 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  The patient has staring episodes as her typical seizures.  The patient however has also had generalized tremor events unassociated with loss of consciousness that are usually associated with pain or being emotionally upset.  The patient had a fall on 19 December 2020 when she was going out to [employer].  The patient injured her right leg, x-rays were all negative.  The patient has recovered from this.  During that emergency room visit, when she was upset due to the pain she had another typical generalized shaking event unassociated with loss of consciousness.  The typical staring events that represent her seizures have not been observed by her mother in many years.  The patient does not operate a motor vehicle currently.  She is followed through psychiatry for her anxiety, but she has not been on any  medications for anxiety.  The patient remains on Keppra 1000 mg twice daily, it is not clear whether or not the Keppra dosing has worsened the anxiety.  The patient appears to have a lot of fear of falling, she will freeze up frequently when she has to go up on the curb or go into an elevator.  She was to have physical therapy on her last visit but this never occurred.  The patient uses a cane for ambulation.  She does fall with some regularity.  REVIEW OF SYSTEMS: Out of a complete 14 system review of symptoms, the patient complains only of the following symptoms, and all other reviewed systems are negative.  See HPI  ALLERGIES: No Known Allergies  HOME MEDICATIONS: Outpatient Medications Prior to Visit  Medication Sig Dispense Refill   calcium carbonate (OS-CAL - DOSED IN MG OF ELEMENTAL CALCIUM) 1250 (500 Ca) MG tablet Take by mouth.     Cholecalciferol (VITAMIN D3) 400 UNITS CAPS Take 1 tablet by mouth daily.     levETIRAcetam (KEPPRA) 1000 MG tablet TAKE 1 TABLET BY MOUTH TWICE A DAY 180 tablet 3   metFORMIN (GLUCOPHAGE) 500 MG tablet Take 500 mg by mouth daily. 3x/day     norethindrone (MICRONOR) 0.35 MG tablet Take by mouth.     VITAMIN D, CHOLECALCIFEROL, PO Take by mouth.     FLUoxetine (PROZAC) 10 MG capsule Take one capsule a day for one week, then take 2 capsules a day for one week, then take 3 capsules a day 90 capsule 3   No facility-administered medications prior to visit.  PAST MEDICAL HISTORY: Past Medical History:  Diagnosis Date   Abnormality of gait    Movement disorder    Seizures ([facility])     PAST SURGICAL HISTORY: Past Surgical History:  Procedure Laterality Date   CYSTECTOMY Left    leg    FAMILY HISTORY: Family History  Problem Relation Age of Onset   Healthy Mother     SOCIAL HISTORY: Social History   Socioeconomic History   Marital status: Single    Spouse name: Not on file   Number of children: 0   Years of education: 12   Highest  education level: Not on file  Occupational History   Not on file  Tobacco Use   Smoking status: Never   Smokeless tobacco: Never  Substance and Sexual Activity   Alcohol use: No   Drug use: No   Sexual activity: Not on file  Other Topics Concern   Not on file  Social History Narrative   Patient lives at home with [name].    Patient is currently working part time.    Patient is single.    Patient has no children.    Patient has high school education.    Social Determinants of Health   Financial Resource Strain: Not on file  Food Insecurity: Not on file  Transportation Needs: Not on file  Physical Activity: Not on file  Stress: Not on file  Social Connections: Not on file  Intimate Partner Violence: Not on file   PHYSICAL EXAM  Vitals:   08/02/21 0801  BP: 117/80  Pulse: 94  Weight: 298 lb 8 oz (135.4 kg)  Height: 5\' 7"  (1.702 m)   Body mass index is 46.75 kg/m.  Generalized: Well developed, in no acute distress   Neurological examination  Mentation: Alert oriented to time, place, history taking. Follows all commands speech and language fluent Cranial nerve II-XII: Pupils were equal round reactive to light. Extraocular movements were full, visual field were full on confrontational test. Facial sensation and strength were normal. Head turning and shoulder shrug  were normal and symmetric. Motor: The motor testing reveals 5 over 5 strength of all 4 extremities. Good symmetric motor tone is noted throughout.  Sensory: Sensory testing is intact to soft touch on all 4 extremities. No evidence of extinction is noted.  Coordination: Cerebellar testing reveals good finger-nose-finger and heel-to-shin bilaterally. Athetoid movements of head and arms noted. Gait and station: Gait is slightly wide-based, uses a cane Reflexes: Deep tendon reflexes are symmetric   DIAGNOSTIC DATA (LABS, IMAGING, TESTING) - I reviewed patient records, labs, notes, testing and  imaging myself where available.  No results found for: WBC, HGB, HCT, MCV, PLT No results found for: NA, K, CL, CO2, GLUCOSE, BUN, CREATININE, CALCIUM, PROT, ALBUMIN, AST, ALT, ALKPHOS, BILITOT, GFRNONAA, GFRAA No results found for: CHOL, HDL, LDLCALC, LDLDIRECT, TRIG, CHOLHDL No results found for: ZOXW9UHGBA1C No results found for: VITAMINB12 No results found for: TSH  ASSESSMENT AND PLAN 36 y.o. year old female  has a past medical history of Abnormality of gait, Movement disorder, and Seizures ([facility]). here with:  1.  Cerebral palsy, athetoid   2.  Gait disorder   3.  Anxiety disorder   4.  Seizure disorder, under good control  -Shanda BumpsJessica is doing well today, gait appears much more stable than in the past, seems to have clearly benefited from PT -She will restart Prozac, using a 10 mg tablet, will work up to 30 mg, if she  does well she will be converted to the 40 mg capsule -Continue Keppra at current dosing -Encouraged to continue safe ambulation practices, using her cane, rolling walker -Follow-up in 6 months or sooner if needed, she will be followed by Dr. Teresa Coombs since Dr. Anne Hahn has retired   Margie Ege, AGNP-C, DNP 08/02/2021, 8:12 AM [facility] [address] [place], [place] [address] [phone]

## 2021-08-23 ENCOUNTER — Telehealth: Payer: Self-pay | Admitting: Neurology

## 2021-08-23 MED ORDER — FLUOXETINE HCL 40 MG PO CAPS: 40.0000 mg | ORAL_CAPSULE | Freq: Every day | ORAL | 3 refills | Status: DC

## 2021-08-23 NOTE — Telephone Encounter (Signed)
I spoke to the patient's mother. She is agreeable to change to fluoxetine 40mg , one tablet daily. Rx sent to pharmacy and to Sarah for cosigning.

## 2021-08-23 NOTE — Telephone Encounter (Signed)
Last seen 08/02/21. Sarah's Plan: Will restart the Prozac 10 mg:  Take one capsule a day for one week, then take 2 capsules a day for one week, then take 3 capsules a day, once you finish this, let me know and I will send in the 40 mg tablet. ____________________________________  Left message for a return call. Need to confirm the plan above with the patient's mother. Per vo by Maralyn Sago, okay to provide a prescription for fluoxetine 40mg , one tablet daily.

## 2021-08-23 NOTE — Telephone Encounter (Signed)
Pt's mother has called to report that pt is doing well on week 3 with the : FLUoxetine (PROZAC) 10 MG capsule   Mother wants to know how many should pt be taking now since she is doing well

## 2022-01-31 ENCOUNTER — Ambulatory Visit: Payer: Medicaid Other | Admitting: Neurology

## 2022-01-31 ENCOUNTER — Encounter: Payer: Self-pay | Admitting: Neurology

## 2022-01-31 VITALS — BP 123/88 | HR 78 | Ht 67.0 in | Wt 300.0 lb

## 2022-01-31 DIAGNOSIS — G40209 Localization-related (focal) (partial) symptomatic epilepsy and epileptic syndromes with complex partial seizures, not intractable, without status epilepticus: Secondary | ICD-10-CM | POA: Diagnosis not present

## 2022-01-31 DIAGNOSIS — G809 Cerebral palsy, unspecified: Secondary | ICD-10-CM

## 2022-01-31 DIAGNOSIS — R269 Unspecified abnormalities of gait and mobility: Secondary | ICD-10-CM | POA: Diagnosis not present

## 2022-01-31 NOTE — Patient Instructions (Signed)
Continue current medications  Consider adding propanolol for her situational anxiety if Prozac not enough to control her symptoms  Follow up in 6 month with Margie Ege NP

## 2022-01-31 NOTE — Progress Notes (Signed)
PATIENT: Shannon Travis DOB: 1985-11-17  REASON FOR VISIT: Follow up for cerebral palsy, gait disorder, seizures HISTORY FROM: Patient and her mother PRIMARY NEUROLOGIST: Dr. Teresa Coombs since Dr. Anne Hahn retired   HISTORY OF PRESENT ILLNESS: Today 01/31/22 Patient presents today for follow-up, she is accompanied by her mother.  Last visit was in January, since then she has not had any additional seizures.  She said that her last seizure was more than a year ago.  She is compliant with the Keppra 1000 mg twice daily.  She is also on fluoxetine 40 mg daily and she reported it helped with her anxiety.  She still having anxiety attack while at church singing and sometimes when crossing the road, denies any falls.  Doing well, last seizure was more than a year    INTERVAL HISTORY 08/02/21 Shannon Travis is here today with follow-up history of cerebral palsy, gait disorder, seizures.  On Keppra.  At last visit Prozac was added to help with anxiety, generalized tremor events during pain or being emotionally upset. Claims took for some time the titration, then stopped. Doesn't remember if it helped. Anxiety continues, especially with ambulation in public places. Completed PT, it helped with gait, has a rolling walker now, uses as needed. Mostly uses during times of known anxiety with ambulation. No seizures (described as staring spells). Taking metformin to help with weight loss. No falls. Tremor in both hands, more in the right, mild in the head. Doing well, here today with her mom.   HISTORY  01/18/2021 Dr. 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  The patient has staring episodes as her typical seizures.  The patient however has also had generalized tremor events unassociated with loss of consciousness that are usually associated with pain or being emotionally upset.  The patient had a fall on 19 December 2020 when she was  going out to [employer].  The patient injured her right leg, x-rays were all negative.  The patient has recovered from this.  During that emergency room visit, when she was upset due to the pain she had another typical generalized shaking event unassociated with loss of consciousness.  The typical staring events that represent her seizures have not been observed by her mother in many years.  The patient does not operate a motor vehicle currently.  She is followed through psychiatry for her anxiety, but she has not been on any medications for anxiety.  The patient remains on Keppra 1000 mg twice daily, it is not clear whether or not the Keppra dosing has worsened the anxiety.  The patient appears to have a lot of fear of falling, she will freeze up frequently when she has to go up on the curb or go into an elevator.  She was to have physical therapy on her last visit but this never occurred.  The patient uses a cane for ambulation.  She does fall with some regularity.  REVIEW OF SYSTEMS: Out of a complete 14 system review of symptoms, the patient complains only of the following symptoms, and all other reviewed systems are negative.  See HPI  ALLERGIES: No Known Allergies  HOME MEDICATIONS: Outpatient Medications Prior to Visit  Medication Sig Dispense Refill   calcium carbonate (OS-CAL - DOSED IN MG OF ELEMENTAL CALCIUM) 1250 (500 Ca) MG tablet Take by mouth.     Cholecalciferol (VITAMIN D3) 400 UNITS CAPS Take 1 tablet by  mouth daily.     FLUoxetine (PROZAC) 40 MG capsule Take 1 capsule (40 mg total) by mouth daily. 90 capsule 3   levETIRAcetam (KEPPRA) 1000 MG tablet TAKE 1 TABLET BY MOUTH TWICE A DAY 180 tablet 3   norethindrone (MICRONOR) 0.35 MG tablet Take by mouth.     topiramate (TOPAMAX) 25 MG tablet Take 2 tablets by mouth daily.     VITAMIN D, CHOLECALCIFEROL, PO Take by mouth.     metFORMIN (GLUCOPHAGE) 500 MG tablet Take 500 mg by mouth daily. 3x/day     No facility-administered  medications prior to visit.    PAST MEDICAL HISTORY: Past Medical History:  Diagnosis Date   Abnormality of gait    Movement disorder    Seizures ([facility])     PAST SURGICAL HISTORY: Past Surgical History:  Procedure Laterality Date   CYSTECTOMY Left    leg    FAMILY HISTORY: Family History  Problem Relation Age of Onset   Healthy Mother     SOCIAL HISTORY: Social History   Socioeconomic History   Marital status: Single    Spouse name: Not on file   Number of children: 0   Years of education: 12   Highest education level: Not on file  Occupational History   Not on file  Tobacco Use   Smoking status: Never   Smokeless tobacco: Never  Substance and Sexual Activity   Alcohol use: No   Drug use: No   Sexual activity: Not on file  Other Topics Concern   Not on file  Social History Narrative   Patient lives at home with her mother Shannon Travis.    Patient is currently working part time.    Patient is single.    Patient has no children.    Patient has high school education.    Social Determinants of Health   Financial Resource Strain: Not on file  Food Insecurity: Not on file  Transportation Needs: Not on file  Physical Activity: Not on file  Stress: Not on file  Social Connections: Not on file  Intimate Partner Violence: Not on file   PHYSICAL EXAM  Vitals:   01/31/22 0901  BP: 123/88  Pulse: 78  Weight: 300 lb (136.1 kg)  Height: 5\' 7"  (1.702 m)   Body mass index is 46.99 kg/m.  Generalized: Well developed, in no acute distress   Neurological examination  Mentation: Alert oriented to time, place, history taking. Follows all commands speech and language fluent Cranial nerve II-XII: Pupils were equal round reactive to light. Extraocular movements were full, visual field were full on confrontational test. Facial sensation and strength were normal. Head turning and shoulder shrug  were normal and symmetric. Motor: The motor testing reveals 5 over 5  strength of all 4 extremities. Good symmetric motor tone is noted throughout.  Sensory: Sensory testing is intact to soft touch on all 4 extremities. No evidence of extinction is noted.  Coordination: Cerebellar testing reveals good finger-nose-finger and heel-to-shin bilaterally. Athetoid movements of head and arms noted. Gait and station: Gait is slightly wide-based, uses a cane Reflexes: Deep tendon reflexes are symmetric   DIAGNOSTIC DATA (LABS, IMAGING, TESTING) - I reviewed patient records, labs, notes, testing and imaging myself where available.  No results found for: "WBC", "HGB", "HCT", "MCV", "PLT" No results found for: "NA", "K", "CL", "CO2", "GLUCOSE", "BUN", "CREATININE", "CALCIUM", "PROT", "ALBUMIN", "AST", "ALT", "ALKPHOS", "BILITOT", "GFRNONAA", "GFRAA" No results found for: "CHOL", "HDL", "LDLCALC", "LDLDIRECT", "TRIG", "CHOLHDL" No results found  for: "HGBA1C" No results found for: "VITAMINB12" No results found for: "TSH"  ASSESSMENT AND PLAN 36 y.o. year old female  has a past medical history of Abnormality of gait, Movement disorder, and Seizures ([facility]). here with:  1.  Cerebral palsy, athetoid   2.  Gait disorder   3.  Anxiety disorder   4.  Seizure disorder, under good control  -Shannon Travis is doing well today, Continue Keppra at current dosing - Continue with Fluoxetine 40 mg daily, if still experiencing anxiety, consider adding propanolol for situation anxiety (church, crossing the road) -Encouraged to continue safe ambulation practices, using her cane, rolling walker -Follow-up in 6 months with Shannon Travis    I have spent a total of 30 minutes dedicated to this patient today, preparing to see patient, performing a medically appropriate examination and evaluation, ordering tests and/or medications and procedures, and counseling and educating the patient/family/caregiver; independently interpreting result and communicating results to the family/patient/caregiver; and  documenting clinical information in the electronic medical record.   Windell Norfolk, MD 01/31/2022, 9:08 AM [facility] [address] [place], [place] [address] [phone]

## 2022-02-05 IMAGING — CR DG KNEE COMPLETE 4+V*R*
4 series · 4 of 4 positions shown · non-contrast
Comparison: None.

CLINICAL DATA: Post fall. Lumbosacral, right hip, right knee, and
right ankle pain.

EXAM:
RIGHT KNEE - COMPLETE 4+ VIEW

[knee ap]
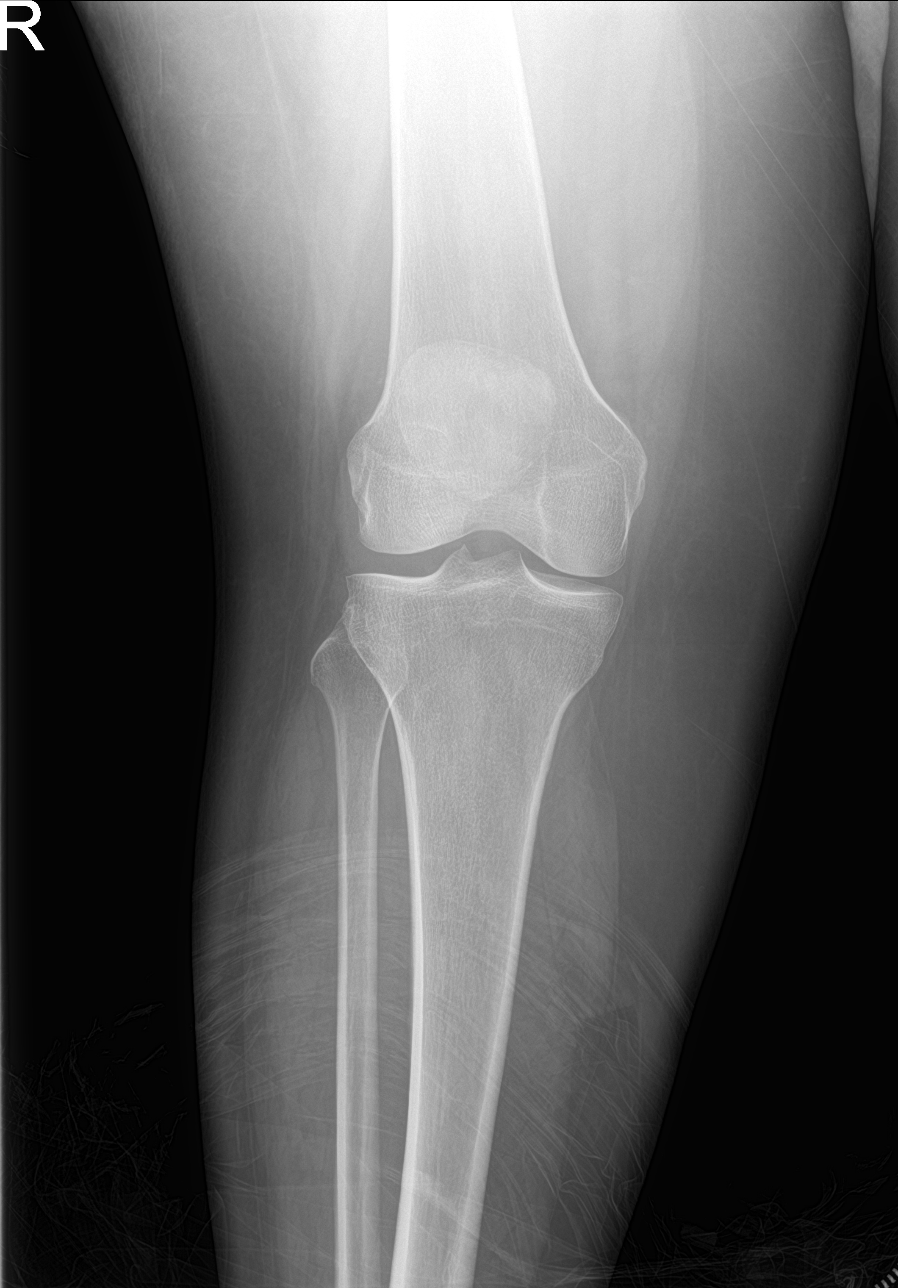

[knee obl (1 of 2)]
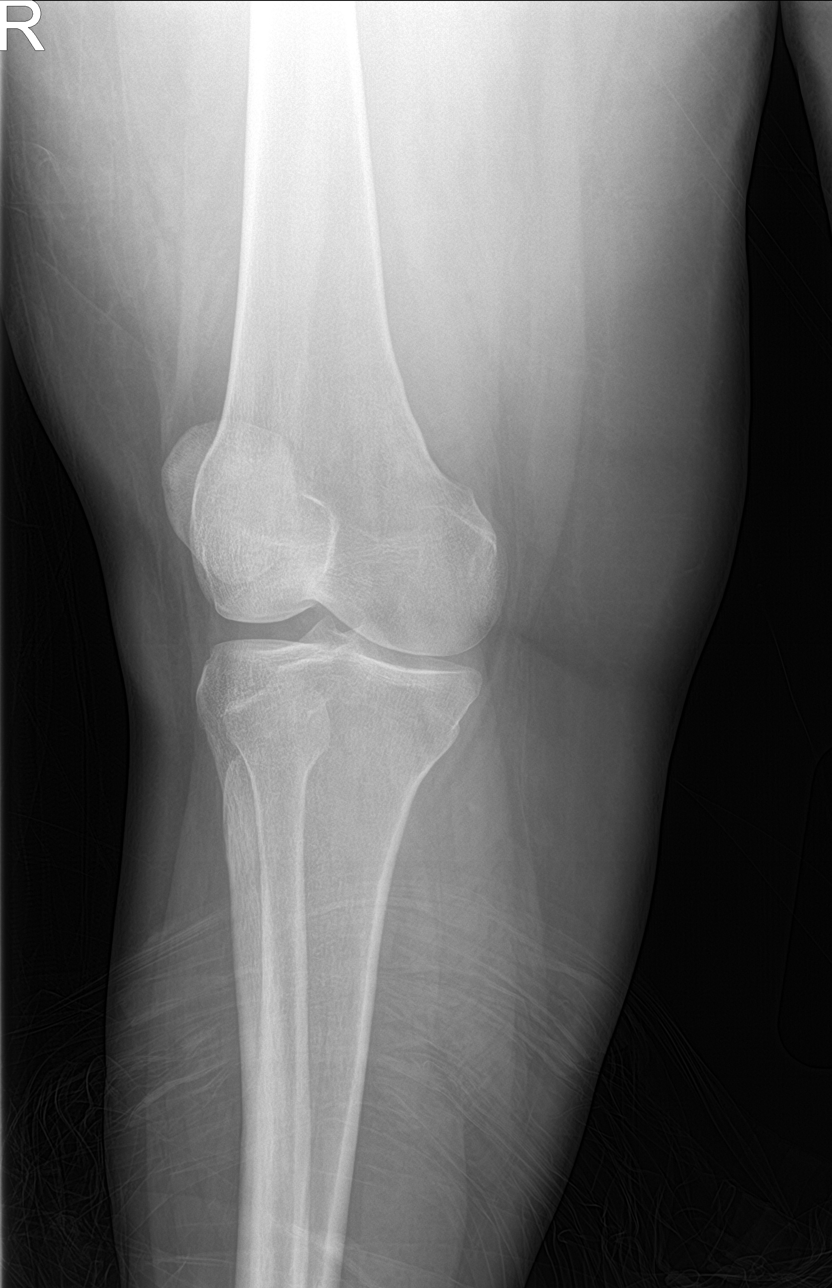

[knee obl (2 of 2)]
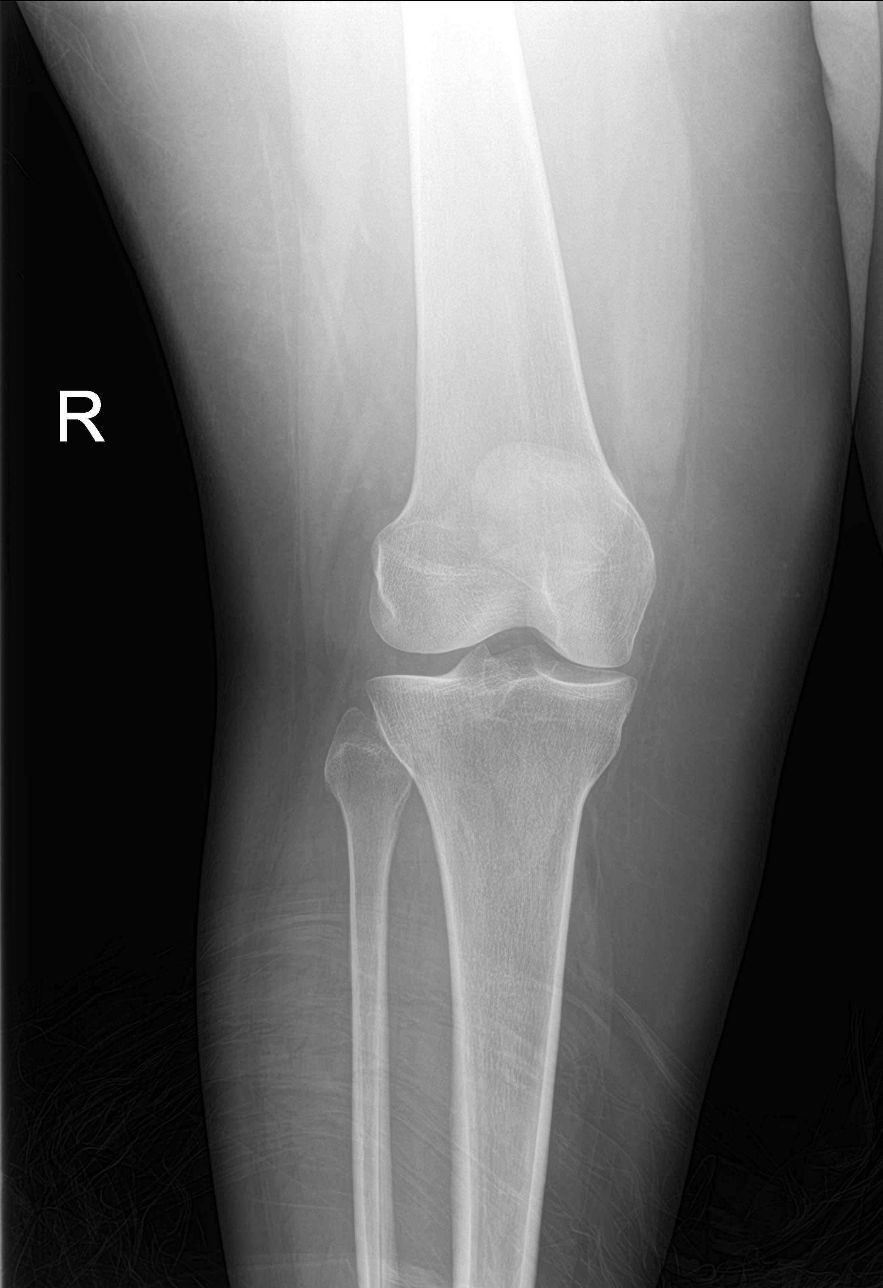

[knee lat]
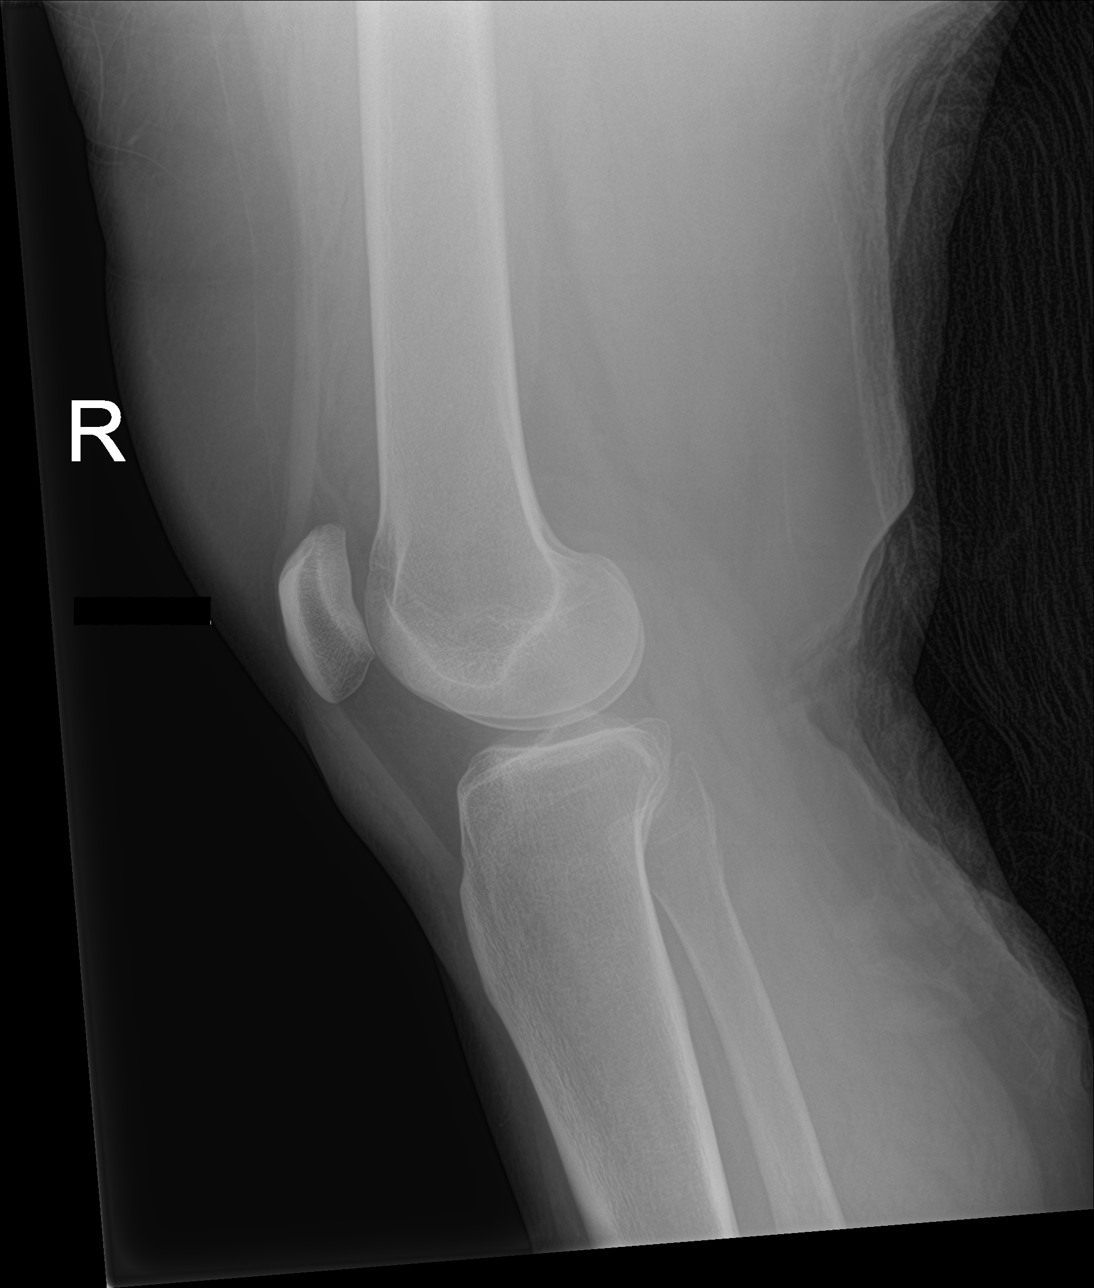

[4 of 4 positions shown; findings below may reference images not displayed]

FINDINGS: No evidence of fracture, dislocation, or joint effusion. Normal
joint spaces and alignment. No evidence of arthropathy or other
focal bone abnormality. Soft tissues are unremarkable.
IMPRESSION: Negative radiographs of the right knee.

## 2022-04-05 ENCOUNTER — Encounter (HOSPITAL_COMMUNITY): Payer: Self-pay

## 2022-04-05 ENCOUNTER — Emergency Department (HOSPITAL_COMMUNITY)
Admission: EM | Admit: 2022-04-05 | Discharge: 2022-04-05 | Disposition: A | Discharge status: Home / Self Care | Payer: Medicaid Other | Attending: Emergency Medicine | Admitting: Emergency Medicine

## 2022-04-05 ENCOUNTER — Emergency Department (HOSPITAL_COMMUNITY): Payer: Medicaid Other

## 2022-04-05 ENCOUNTER — Other Ambulatory Visit: Payer: Self-pay

## 2022-04-05 DIAGNOSIS — W010XXA Fall on same level from slipping, tripping and stumbling without subsequent striking against object, initial encounter: Secondary | ICD-10-CM | POA: Diagnosis not present

## 2022-04-05 DIAGNOSIS — S82839A Other fracture of upper and lower end of unspecified fibula, initial encounter for closed fracture: Secondary | ICD-10-CM

## 2022-04-05 DIAGNOSIS — S82831A Other fracture of upper and lower end of right fibula, initial encounter for closed fracture: Secondary | ICD-10-CM | POA: Diagnosis not present

## 2022-04-05 DIAGNOSIS — S8991XA Unspecified injury of right lower leg, initial encounter: Secondary | ICD-10-CM | POA: Diagnosis present

## 2022-04-05 DIAGNOSIS — Y92828 Other wilderness area as the place of occurrence of the external cause: Secondary | ICD-10-CM | POA: Insufficient documentation

## 2022-04-05 LAB — I-STAT BETA HCG BLOOD, ED (MC, WL, AP ONLY): I-stat hCG, quantitative: <5 m[IU]/mL (ref <5)

## 2022-04-05 MED ORDER — HYDROMORPHONE HCL 1 MG/ML IJ SOLN
0.5000 mg | Freq: Once | INTRAMUSCULAR | Status: AC
  Administered 2022-04-05: 0.5 mg via INTRAVENOUS
  Filled 2022-04-05: qty 1

## 2022-04-05 MED ORDER — OXYCODONE HCL 5 MG PO TABS: 5.0000 mg | ORAL_TABLET | ORAL | 0 refills | Status: DC | PRN

## 2022-04-05 MED ORDER — HYDROCODONE-ACETAMINOPHEN 5-325 MG PO TABS
1.0000 | ORAL_TABLET | Freq: Once | ORAL | Status: AC
  Administered 2022-04-05: 1 via ORAL
  Filled 2022-04-05: qty 1

## 2022-04-05 MED ORDER — SODIUM CHLORIDE 0.9 % IV BOLUS
1000.0000 mL | Freq: Once | INTRAVENOUS | Status: AC
  Administered 2022-04-05: 1000 mL via INTRAVENOUS

## 2022-04-05 MED ORDER — ACETAMINOPHEN 325 MG PO TABS: 650.0000 mg | ORAL_TABLET | Freq: Four times a day (QID) | ORAL | 0 refills | Status: DC | PRN

## 2022-04-05 MED ORDER — ONDANSETRON HCL 4 MG/2ML IJ SOLN
4.0000 mg | Freq: Once | INTRAMUSCULAR | Status: AC
  Administered 2022-04-05: 4 mg via INTRAVENOUS
  Filled 2022-04-05: qty 2

## 2022-04-05 NOTE — Discharge Instructions (Addendum)
It was a pleasure caring for you today in the emergency department.  Please return to the emergency department for any worsening or worrisome symptoms.  Please call DR  Kathaleen Bury tomorrow to set up follow-up appointment for your fibular fracture

## 2022-04-05 NOTE — Progress Notes (Signed)
Orthopedic Tech Progress Note Patient Details:  Shannon Travis 1986-06-01 [id number]  Ortho Devices Type of Ortho Device: Stirrup splint, Crutches Ortho Device/Splint Location: Right ankle Ortho Device/Splint Interventions: Application   Post Interventions Patient Tolerated: Well  [name] [name] 04/05/2022, 8:57 PM

## 2022-04-05 NOTE — ED Notes (Signed)
Pt taken to car with assistance. RN and two techs used steady to get pt in car safely.

## 2022-04-05 NOTE — ED Triage Notes (Signed)
Pt bib ems from home after mechanical fall from couch. Pt states "all my weight fell on my right ankle". Pain and deformity to right ankle. 271mcg Fentanyl given enroute by ems

## 2022-04-05 NOTE — ED Provider Notes (Signed)
[facility] DEPT Provider Note   CSN: [id number] Arrival date & time: 04/05/22  1751     History  Chief Complaint  Patient presents with   Ankle Pain    Shannon Travis is a 36 y.o. female.  Patient as above with significant medical history as below, including seizures on Keppra who presents to the ED with complaint of right ankle injury.  Patient reports she was attempting to get up from the couch, she tripped over something on the ground and fell onto her right side.  Her ankle rolled and she fell on top of it.  Experienced immediate pain to her right ankle after the incident.  No pain to the ipsilateral knee or hip.  No head injury, no LOC.  She was able to shimmy over to the couch and called for help.  EMS applied splint, she applied ice pack.  Given fentanyl in route, pain improved.    Past Medical History:  Diagnosis Date   Abnormality of gait    Movement disorder    Seizures ([facility])     Past Surgical History:  Procedure Laterality Date   CYSTECTOMY Left    leg     The history is provided by the patient. No language interpreter was used.  Ankle Pain Associated symptoms: no back pain and no fever        Home Medications Prior to Admission medications   Medication Sig Start Date End Date Taking? Authorizing Provider  acetaminophen (TYLENOL) 325 MG tablet Take 2 tablets (650 mg total) by mouth every 6 (six) hours as needed. 04/05/22  Yes Wynona Dove A, DO  oxyCODONE (ROXICODONE) 5 MG immediate release tablet Take 1 tablet (5 mg total) by mouth every 4 (four) hours as needed for severe pain. 04/05/22  Yes Wynona Dove A, DO  calcium carbonate (OS-CAL - DOSED IN MG OF ELEMENTAL CALCIUM) 1250 (500 Ca) MG tablet Take by mouth. 12/18/19   [provider]  Cholecalciferol (VITAMIN D3) 400 UNITS CAPS Take 1 tablet by mouth daily.    [provider]  FLUoxetine (PROZAC) 40 MG capsule Take 1 capsule (40 mg total) by mouth daily.  08/23/21   Suzzanne Cloud, NP  levETIRAcetam (KEPPRA) 1000 MG tablet TAKE 1 TABLET BY MOUTH TWICE A DAY 05/09/21   Kathrynn Ducking, MD  norethindrone (MICRONOR) 0.35 MG tablet Take by mouth. 11/25/19   [provider]  topiramate (TOPAMAX) 25 MG tablet Take 2 tablets by mouth daily.    [provider]  VITAMIN D, CHOLECALCIFEROL, PO Take by mouth.    [provider]      Allergies    Patient has no known allergies.    Review of Systems   Review of Systems  Constitutional:  Negative for activity change and fever.  HENT:  Negative for facial swelling and trouble swallowing.   Eyes:  Negative for discharge and redness.  Respiratory:  Negative for cough and shortness of breath.   Cardiovascular:  Negative for chest pain and palpitations.  Gastrointestinal:  Negative for abdominal pain and nausea.  Genitourinary:  Negative for dysuria and flank pain.  Musculoskeletal:  Positive for arthralgias. Negative for back pain and gait problem.  Skin:  Negative for pallor and rash.  Neurological:  Negative for syncope and headaches.    Physical Exam Updated Vital Signs BP 101/67 (BP Location: Left Arm)   Pulse 76   Temp 98.3 F (36.8 C) (Oral)   Resp 14  Ht 5\' 7"  (1.702 m)   Wt 133.8 kg   SpO2 96%   BMI 46.20 kg/m  Physical Exam Vitals and nursing note reviewed.  Constitutional:      General: She is not in acute distress.    Appearance: Normal appearance. She is obese. She is not ill-appearing or diaphoretic.  HENT:     Head: Normocephalic and atraumatic. No raccoon eyes, Battle's sign, right periorbital erythema or left periorbital erythema.     Jaw: There is normal jaw occlusion.     Right Ear: External ear normal.     Left Ear: External ear normal.     Nose: Nose normal.     Mouth/Throat:     Mouth: Mucous membranes are moist.  Eyes:     General: No scleral icterus.       Right eye: No discharge.        Left eye: No discharge.  Cardiovascular:      Rate and Rhythm: Normal rate and regular rhythm.     Pulses: Normal pulses.     Heart sounds: Normal heart sounds.  Pulmonary:     Effort: Pulmonary effort is normal. No respiratory distress.     Breath sounds: Normal breath sounds.  Abdominal:     General: Abdomen is flat.     Tenderness: There is no abdominal tenderness.  Musculoskeletal:        General: Normal range of motion.     Cervical back: Normal range of motion.     Right lower leg: No edema.     Left lower leg: No edema.       Legs:     Comments: No knee ttp on palpation b/l Pelvis stable to ap pressure No hip pain to ttp b/l 2+ DP pulse right lower extremity, cap refill is brisk right lower extremity  Skin:    General: Skin is warm and dry.     Capillary Refill: Capillary refill takes less than 2 seconds.  Neurological:     Mental Status: She is alert and oriented to person, place, and time.     GCS: GCS eye subscore is 4. GCS verbal subscore is 5. GCS motor subscore is 6.  Psychiatric:        Mood and Affect: Mood normal.        Behavior: Behavior normal.     ED Results / Procedures / Treatments   Labs (all labs ordered are listed, but only abnormal results are displayed) Labs Reviewed  I-STAT BETA HCG BLOOD, ED ([facility], [facility], [facility] ONLY)    EKG None  Radiology DG Knee Complete 4 Views Right  Result Date: 04/05/2022 CLINICAL DATA:  04/07/2022, right lower extremity pain EXAM: RIGHT KNEE - COMPLETE 4+ VIEW COMPARISON:  12/19/2020 FINDINGS: Frontal, bilateral oblique, and cross-table lateral views of the right knee are obtained. No acute fracture, subluxation, or dislocation. Mild medial compartmental joint space narrowing. No joint effusion. Soft tissues are unremarkable. IMPRESSION: 1. Mild medial compartmental osteoarthritis. 2. No acute fracture. Electronically Signed   By: 02/18/2021 M.D.   On: 04/05/2022 19:32   DG Ankle Complete Right  Result Date: 04/05/2022 CLINICAL DATA:  Right ankle pain after fall. EXAM:  RIGHT ANKLE - COMPLETE 3+ VIEW COMPARISON:  None Available. FINDINGS: Mildly displaced oblique fracture is seen involving the distal right fibula. The tibia is unremarkable. IMPRESSION: Mildly displaced distal right fibular fracture. Electronically Signed   By: 04/07/2022 M.D.   On: 04/05/2022 18:58  Procedures Procedures    Medications Ordered in ED Medications  HYDROcodone-acetaminophen (NORCO/VICODIN) 5-325 MG per tablet 1 tablet (has no administration in time range)  ondansetron (ZOFRAN) injection 4 mg (4 mg Intravenous Given 04/05/22 1839)  HYDROmorphone (DILAUDID) injection 0.5 mg (0.5 mg Intravenous Given 04/05/22 1839)  sodium chloride 0.9 % bolus 1,000 mL (0 mLs Intravenous Stopped 04/05/22 2204)    ED Course/ Medical Decision Making/ A&P                           Medical Decision Making Amount and/or Complexity of Data Reviewed Radiology: ordered.  Risk OTC drugs. Prescription drug management.   This patient presents to the ED with chief complaint(s) of right ankle injury with pertinent past medical history of above which further complicates the presenting complaint. The complaint involves an extensive differential diagnosis and also carries with it a high risk of complications and morbidity.    The differential diagnosis includes but not limited to sprain, strain, fracture, soft tissue injury, contusion, vascular injury, MSK, etc. Serious etiologies were considered.   The initial plan is to will get x-ray, check pregnancy test, analgesics   Additional history obtained: Additional history obtained from  n/a Records reviewed Primary Care Documents and prior labs and imaging, home medications  Independent labs interpretation:  The following labs were independently interpreted:  I-STAT Prag negative  Independent visualization of imaging: - I independently visualized the following imaging with scope of interpretation limited to determining acute life threatening  conditions related to emergency care: Ankle x-ray right, knee x-ray, which revealed distal fibular fracture  Treatment and Reassessment: Analgesics, antiemetic, IV fluids >> Improved  Consultation: - Consulted or discussed management/test interpretation w/ external professional: na  Consideration for admission or further workup: Admission was considered   36 year old female to the ED with fall, right ankle injury.  Found to have distal fibular fracture.  Placed in splint.  Pain well controlled.  Neurovascular intact bilateral lower extremities.  Given crutches.  Given orthopedics follow-up.  The patient improved significantly and was discharged in stable condition. Detailed discussions were had with the patient regarding current findings, and need for close f/u with PCP or on call doctor. The patient has been instructed to return immediately if the symptoms worsen in any way for re-evaluation. Patient verbalized understanding and is in agreement with current care plan. All questions answered prior to discharge.    Social Determinants of health: Social History   Tobacco Use   Smoking status: Never   Smokeless tobacco: Never  Substance Use Topics   Alcohol use: No   Drug use: No            Final Clinical Impression(s) / ED Diagnoses Final diagnoses:  Closed fracture of distal end of fibula, unspecified fracture morphology, initial encounter    Rx / DC Orders ED Discharge Orders          Ordered    oxyCODONE (ROXICODONE) 5 MG immediate release tablet  Every 4 hours PRN        04/05/22 2222    acetaminophen (TYLENOL) 325 MG tablet  Every 6 hours PRN        04/05/22 2222              Tanda Rockers A, DO 04/05/22 2225

## 2022-04-16 ENCOUNTER — Telehealth: Payer: Self-pay

## 2022-04-16 ENCOUNTER — Encounter: Payer: Self-pay | Admitting: Neurology

## 2022-04-16 ENCOUNTER — Telehealth: Payer: Self-pay | Admitting: Neurology

## 2022-04-16 NOTE — Telephone Encounter (Signed)
Medical Clearance received 04/16/2022 for surgical procedure on 10/3 with [facility]. Dr. April Manson reviewed and have clearance for up coing open treatment right ankle Lateral malleolus fracture with internal fixation.  Form faxed back to # [phone], confirmation received.

## 2022-04-16 NOTE — Telephone Encounter (Signed)
Noted, will monitor for form.

## 2022-04-16 NOTE — Telephone Encounter (Signed)
Shannon Travis/ [name] is calling. Stated she just wanted to let nurses know she is going to fax a medical clearance over for PT. Jinny Blossom stated if there are any question please call her.

## 2022-04-17 ENCOUNTER — Other Ambulatory Visit (HOSPITAL_COMMUNITY): Payer: Self-pay | Admitting: Orthopedic Surgery

## 2022-04-17 ENCOUNTER — Encounter (HOSPITAL_BASED_OUTPATIENT_CLINIC_OR_DEPARTMENT_OTHER): Payer: Self-pay | Admitting: Orthopedic Surgery

## 2022-04-19 ENCOUNTER — Inpatient Hospital Stay (HOSPITAL_BASED_OUTPATIENT_CLINIC_OR_DEPARTMENT_OTHER)
Admission: RE | Admit: 2022-04-19 | Discharge: 2022-05-04 | DRG: 493 | Disposition: A | Discharge status: Skilled Nursing Facility | Payer: Medicaid Other | Attending: Orthopedic Surgery | Admitting: Orthopedic Surgery

## 2022-04-19 ENCOUNTER — Other Ambulatory Visit: Payer: Self-pay

## 2022-04-19 ENCOUNTER — Encounter (HOSPITAL_BASED_OUTPATIENT_CLINIC_OR_DEPARTMENT_OTHER): Payer: Self-pay | Admitting: Orthopedic Surgery

## 2022-04-19 ENCOUNTER — Encounter (HOSPITAL_COMMUNITY)
Admission: RE | Disposition: A | Discharge status: Skilled Nursing Facility | Payer: Self-pay | Source: Home / Self Care | Attending: Orthopedic Surgery

## 2022-04-19 ENCOUNTER — Ambulatory Visit (HOSPITAL_BASED_OUTPATIENT_CLINIC_OR_DEPARTMENT_OTHER): Payer: Medicaid Other | Admitting: Certified Registered"

## 2022-04-19 ENCOUNTER — Ambulatory Visit (HOSPITAL_BASED_OUTPATIENT_CLINIC_OR_DEPARTMENT_OTHER): Payer: Medicaid Other

## 2022-04-19 DIAGNOSIS — Z6841 Body Mass Index (BMI) 40.0 and over, adult: Secondary | ICD-10-CM | POA: Diagnosis not present

## 2022-04-19 DIAGNOSIS — S82409A Unspecified fracture of shaft of unspecified fibula, initial encounter for closed fracture: Secondary | ICD-10-CM | POA: Diagnosis present

## 2022-04-19 DIAGNOSIS — S8261XA Displaced fracture of lateral malleolus of right fibula, initial encounter for closed fracture: Principal | ICD-10-CM | POA: Diagnosis present

## 2022-04-19 DIAGNOSIS — R7303 Prediabetes: Secondary | ICD-10-CM | POA: Diagnosis present

## 2022-04-19 DIAGNOSIS — F419 Anxiety disorder, unspecified: Secondary | ICD-10-CM | POA: Diagnosis not present

## 2022-04-19 DIAGNOSIS — G40909 Epilepsy, unspecified, not intractable, without status epilepticus: Secondary | ICD-10-CM | POA: Diagnosis present

## 2022-04-19 DIAGNOSIS — Z79899 Other long term (current) drug therapy: Secondary | ICD-10-CM

## 2022-04-19 DIAGNOSIS — W19XXXA Unspecified fall, initial encounter: Secondary | ICD-10-CM | POA: Diagnosis present

## 2022-04-19 DIAGNOSIS — G809 Cerebral palsy, unspecified: Secondary | ICD-10-CM | POA: Diagnosis present

## 2022-04-19 DIAGNOSIS — E785 Hyperlipidemia, unspecified: Secondary | ICD-10-CM | POA: Diagnosis present

## 2022-04-19 DIAGNOSIS — S8261XD Displaced fracture of lateral malleolus of right fibula, subsequent encounter for closed fracture with routine healing: Secondary | ICD-10-CM | POA: Diagnosis not present

## 2022-04-19 DIAGNOSIS — Z01818 Encounter for other preprocedural examination: Principal | ICD-10-CM

## 2022-04-19 DIAGNOSIS — Z751 Person awaiting admission to adequate facility elsewhere: Secondary | ICD-10-CM

## 2022-04-19 HISTORY — DX: Anxiety disorder, unspecified: F41.9

## 2022-04-19 HISTORY — DX: Prediabetes: R73.03

## 2022-04-19 HISTORY — PX: ORIF ANKLE FRACTURE: SHX5408

## 2022-04-19 HISTORY — DX: Cerebral palsy, unspecified: G80.9

## 2022-04-19 LAB — CBC
HCT: 41.8 % (ref 36.0–46.0)
Hemoglobin: 14 g/dL (ref 12.0–15.0)
MCH: 30.7 pg (ref 26.0–34.0)
MCHC: 33.5 g/dL (ref 30.0–36.0)
MCV: 91.7 fL (ref 80.0–100.0)
Platelets: 323 10*3/uL (ref 150–400)
RBC: 4.56 MIL/uL (ref 3.87–5.11)
RDW: 13.2 % (ref 11.5–15.5)
WBC: 6.9 10*3/uL (ref 4.0–10.5)
nRBC: 0 % (ref 0.0–0.2)

## 2022-04-19 LAB — CREATININE, SERUM
Creatinine, Ser: 0.82 mg/dL (ref 0.44–1.00)
GFR, Estimated: >60 mL/min (ref >60)

## 2022-04-19 LAB — POCT PREGNANCY, URINE: Preg Test, Ur: NEGATIVE

## 2022-04-19 SURGERY — OPEN REDUCTION INTERNAL FIXATION (ORIF) ANKLE FRACTURE
Anesthesia: General | Site: Ankle | Laterality: Right

## 2022-04-19 MED ORDER — ONDANSETRON HCL 4 MG/2ML IJ SOLN: 4.0000 mg | Freq: Four times a day (QID) | INTRAMUSCULAR | Status: DC | PRN

## 2022-04-19 MED ORDER — OXYCODONE HCL 5 MG/5ML PO SOLN: 5.0000 mg | Freq: Once | ORAL | Status: DC | PRN

## 2022-04-19 MED ORDER — 0.9 % SODIUM CHLORIDE (POUR BTL) OPTIME
TOPICAL | Status: DC | PRN
  Administered 2022-04-19: 200 mL

## 2022-04-19 MED ORDER — AMISULPRIDE (ANTIEMETIC) 5 MG/2ML IV SOLN: 10.0000 mg | Freq: Once | INTRAVENOUS | Status: DC | PRN

## 2022-04-19 MED ORDER — MIDAZOLAM HCL 2 MG/2ML IJ SOLN
2.0000 mg | Freq: Once | INTRAMUSCULAR | Status: AC
  Administered 2022-04-19: 2 mg via INTRAVENOUS

## 2022-04-19 MED ORDER — SODIUM CHLORIDE 0.9 % IV SOLN: INTRAVENOUS | Status: DC

## 2022-04-19 MED ORDER — MORPHINE SULFATE (PF) 2 MG/ML IV SOLN
0.5000 mg | INTRAVENOUS | Status: DC | PRN
  Administered 2022-04-20 (×2): 1 mg via INTRAVENOUS
  Filled 2022-04-19 (×2): qty 1

## 2022-04-19 MED ORDER — TOPIRAMATE 100 MG PO TABS
100.0000 mg | ORAL_TABLET | Freq: Every day | ORAL | Status: DC
  Administered 2022-04-20 – 2022-05-04 (×15): 100 mg via ORAL
  Filled 2022-04-19 (×15): qty 1

## 2022-04-19 MED ORDER — DOCUSATE SODIUM 100 MG PO CAPS
100.0000 mg | ORAL_CAPSULE | Freq: Two times a day (BID) | ORAL | Status: DC
  Administered 2022-04-19 – 2022-05-03 (×20): 100 mg via ORAL
  Filled 2022-04-19 (×27): qty 1

## 2022-04-19 MED ORDER — SENNA 8.6 MG PO TABS
1.0000 | ORAL_TABLET | Freq: Two times a day (BID) | ORAL | Status: DC
  Administered 2022-04-19 – 2022-05-03 (×18): 8.6 mg via ORAL
  Filled 2022-04-19 (×25): qty 1

## 2022-04-19 MED ORDER — HYDROMORPHONE HCL 1 MG/ML IJ SOLN: 0.2500 mg | INTRAMUSCULAR | Status: DC | PRN

## 2022-04-19 MED ORDER — POLYETHYLENE GLYCOL 3350 17 G PO PACK
17.0000 g | PACK | Freq: Every day | ORAL | Status: DC | PRN
  Administered 2022-04-23: 17 g via ORAL
  Filled 2022-04-19: qty 1

## 2022-04-19 MED ORDER — CEFAZOLIN IN SODIUM CHLORIDE 3-0.9 GM/100ML-% IV SOLN
INTRAVENOUS | Status: AC
  Filled 2022-04-19: qty 100

## 2022-04-19 MED ORDER — VANCOMYCIN HCL 500 MG IV SOLR
INTRAVENOUS | Status: AC
  Filled 2022-04-19: qty 10

## 2022-04-19 MED ORDER — PROMETHAZINE HCL 25 MG/ML IJ SOLN: 6.2500 mg | INTRAMUSCULAR | Status: DC | PRN

## 2022-04-19 MED ORDER — RIVAROXABAN 10 MG PO TABS: 10.0000 mg | ORAL_TABLET | Freq: Every day | ORAL | 0 refills | Status: DC

## 2022-04-19 MED ORDER — LIDOCAINE HCL (CARDIAC) PF 100 MG/5ML IV SOSY
PREFILLED_SYRINGE | INTRAVENOUS | Status: DC | PRN
  Administered 2022-04-19: 30 mg via INTRAVENOUS

## 2022-04-19 MED ORDER — MIDAZOLAM HCL 2 MG/2ML IJ SOLN
INTRAMUSCULAR | Status: AC
  Filled 2022-04-19: qty 2

## 2022-04-19 MED ORDER — ACETAMINOPHEN 325 MG PO TABS
325.0000 mg | ORAL_TABLET | Freq: Four times a day (QID) | ORAL | Status: DC | PRN
  Administered 2022-04-20 – 2022-05-01 (×2): 650 mg via ORAL
  Filled 2022-04-19 (×2): qty 2

## 2022-04-19 MED ORDER — DEXAMETHASONE SODIUM PHOSPHATE 10 MG/ML IJ SOLN
INTRAMUSCULAR | Status: DC | PRN
  Administered 2022-04-19: 4 mg via INTRAVENOUS

## 2022-04-19 MED ORDER — ONDANSETRON HCL 4 MG PO TABS: 4.0000 mg | ORAL_TABLET | Freq: Four times a day (QID) | ORAL | Status: DC | PRN

## 2022-04-19 MED ORDER — CEFAZOLIN IN SODIUM CHLORIDE 3-0.9 GM/100ML-% IV SOLN
3.0000 g | INTRAVENOUS | Status: AC
  Administered 2022-04-19: 3 g via INTRAVENOUS

## 2022-04-19 MED ORDER — ENOXAPARIN SODIUM 40 MG/0.4ML IJ SOSY
40.0000 mg | PREFILLED_SYRINGE | INTRAMUSCULAR | Status: DC
  Administered 2022-04-20 – 2022-05-04 (×15): 40 mg via SUBCUTANEOUS
  Filled 2022-04-19 (×15): qty 0.4

## 2022-04-19 MED ORDER — MEPERIDINE HCL 25 MG/ML IJ SOLN: 6.2500 mg | INTRAMUSCULAR | Status: DC | PRN

## 2022-04-19 MED ORDER — PROPOFOL 10 MG/ML IV BOLUS
INTRAVENOUS | Status: DC | PRN
  Administered 2022-04-19: 150 mg via INTRAVENOUS
  Administered 2022-04-19: 200 mg via INTRAVENOUS

## 2022-04-19 MED ORDER — VANCOMYCIN HCL 500 MG IV SOLR
INTRAVENOUS | Status: DC | PRN
  Administered 2022-04-19: 500 mg via TOPICAL

## 2022-04-19 MED ORDER — FENTANYL CITRATE (PF) 100 MCG/2ML IJ SOLN
100.0000 ug | Freq: Once | INTRAMUSCULAR | Status: AC
  Administered 2022-04-19: 100 ug via INTRAVENOUS

## 2022-04-19 MED ORDER — ROPIVACAINE HCL 5 MG/ML IJ SOLN
INTRAMUSCULAR | Status: DC | PRN
  Administered 2022-04-19: 30 mL via PERINEURAL

## 2022-04-19 MED ORDER — LEVETIRACETAM 500 MG PO TABS
1000.0000 mg | ORAL_TABLET | Freq: Two times a day (BID) | ORAL | Status: DC
  Administered 2022-04-19 – 2022-05-04 (×31): 1000 mg via ORAL
  Filled 2022-04-19 (×31): qty 2

## 2022-04-19 MED ORDER — OXYCODONE HCL 5 MG PO TABS: 5.0000 mg | ORAL_TABLET | Freq: Once | ORAL | Status: DC | PRN

## 2022-04-19 MED ORDER — ONDANSETRON HCL 4 MG/2ML IJ SOLN
INTRAMUSCULAR | Status: DC | PRN
  Administered 2022-04-19: 4 mg via INTRAVENOUS

## 2022-04-19 MED ORDER — BISACODYL 10 MG RE SUPP
10.0000 mg | Freq: Every day | RECTAL | Status: DC | PRN
  Filled 2022-04-19: qty 1

## 2022-04-19 MED ORDER — MAGNESIUM CITRATE PO SOLN: 1.0000 | Freq: Once | ORAL | Status: DC | PRN

## 2022-04-19 MED ORDER — HYDROCODONE-ACETAMINOPHEN 5-325 MG PO TABS
1.0000 | ORAL_TABLET | ORAL | Status: DC | PRN
  Administered 2022-04-19: 1 via ORAL
  Administered 2022-04-20 – 2022-04-24 (×9): 2 via ORAL
  Administered 2022-04-24: 1 via ORAL
  Administered 2022-04-25 (×2): 2 via ORAL
  Administered 2022-04-25: 1 via ORAL
  Administered 2022-04-26 – 2022-05-04 (×12): 2 via ORAL
  Filled 2022-04-19 (×9): qty 2
  Filled 2022-04-19: qty 1
  Filled 2022-04-19 (×9): qty 2
  Filled 2022-04-19: qty 1
  Filled 2022-04-19 (×4): qty 2
  Filled 2022-04-19: qty 1
  Filled 2022-04-19: qty 2

## 2022-04-19 MED ORDER — FENTANYL CITRATE (PF) 100 MCG/2ML IJ SOLN
INTRAMUSCULAR | Status: AC
  Filled 2022-04-19: qty 2

## 2022-04-19 MED ORDER — LACTATED RINGERS IV SOLN: INTRAVENOUS | Status: DC

## 2022-04-19 MED ORDER — PROPOFOL 500 MG/50ML IV EMUL
INTRAVENOUS | Status: DC | PRN
  Administered 2022-04-19: 25 ug/kg/min via INTRAVENOUS

## 2022-04-19 MED ORDER — NORETHINDRONE 0.35 MG PO TABS
1.0000 | ORAL_TABLET | Freq: Every day | ORAL | Status: DC
  Administered 2022-04-20 – 2022-05-04 (×15): 0.35 mg via ORAL
  Filled 2022-04-19: qty 1

## 2022-04-19 MED ORDER — FLUOXETINE HCL 20 MG PO CAPS
40.0000 mg | ORAL_CAPSULE | Freq: Every day | ORAL | Status: DC
  Administered 2022-04-20 – 2022-05-04 (×15): 40 mg via ORAL
  Filled 2022-04-19 (×15): qty 2

## 2022-04-19 MED ORDER — OXYCODONE HCL 5 MG PO TABS: 5.0000 mg | ORAL_TABLET | Freq: Four times a day (QID) | ORAL | 0 refills | Status: AC | PRN

## 2022-04-19 MED ORDER — HYDROCODONE-ACETAMINOPHEN 7.5-325 MG PO TABS
1.0000 | ORAL_TABLET | ORAL | Status: DC | PRN
  Administered 2022-04-21 – 2022-04-30 (×8): 2 via ORAL
  Administered 2022-04-30 – 2022-05-02 (×3): 1 via ORAL
  Filled 2022-04-19: qty 2
  Filled 2022-04-19: qty 1
  Filled 2022-04-19 (×6): qty 2
  Filled 2022-04-19 (×2): qty 1
  Filled 2022-04-19: qty 2

## 2022-04-19 SURGICAL SUPPLY — 78 items
APL PRP STRL LF DISP 70% ISPRP (MISCELLANEOUS) ×1
BANDAGE ESMARK 6X9 LF (GAUZE/BANDAGES/DRESSINGS) IMPLANT
BIT DRILL 2.5X2.75 QC CALB (BIT) IMPLANT
BIT DRILL 3.5X5.5 QC CALB (BIT) IMPLANT
BLADE SURG 15 STRL LF DISP TIS (BLADE) ×4 IMPLANT
BLADE SURG 15 STRL SS (BLADE) ×2
BNDG CMPR 9X4 STRL LF SNTH (GAUZE/BANDAGES/DRESSINGS)
BNDG CMPR 9X6 STRL LF SNTH (GAUZE/BANDAGES/DRESSINGS)
BNDG ELASTIC 4X5.8 VLCR STR LF (GAUZE/BANDAGES/DRESSINGS) ×2 IMPLANT
BNDG ELASTIC 6X5.8 VLCR STR LF (GAUZE/BANDAGES/DRESSINGS) ×2 IMPLANT
BNDG ESMARK 4X9 LF (GAUZE/BANDAGES/DRESSINGS) IMPLANT
BNDG ESMARK 6X9 LF (GAUZE/BANDAGES/DRESSINGS)
CANISTER SUCT 1200ML W/VALVE (MISCELLANEOUS) ×2 IMPLANT
CHLORAPREP W/TINT 26 (MISCELLANEOUS) ×2 IMPLANT
COVER BACK TABLE 60X90IN (DRAPES) ×2 IMPLANT
CUFF TOURN SGL QUICK 34 (TOURNIQUET CUFF)
CUFF TRNQT CYL 34X4.125X (TOURNIQUET CUFF) IMPLANT
DRAPE EXTREMITY T 121X128X90 (DISPOSABLE) ×2 IMPLANT
DRAPE OEC MINIVIEW 54X84 (DRAPES) ×2 IMPLANT
DRAPE U-SHAPE 47X51 STRL (DRAPES) ×2 IMPLANT
DRSG MEPITEL 4X7.2 (GAUZE/BANDAGES/DRESSINGS) ×2 IMPLANT
ELECT REM PT RETURN 9FT ADLT (ELECTROSURGICAL) ×1
ELECTRODE REM PT RTRN 9FT ADLT (ELECTROSURGICAL) ×2 IMPLANT
GAUZE PAD ABD 8X10 STRL (GAUZE/BANDAGES/DRESSINGS) ×4 IMPLANT
GAUZE SPONGE 4X4 12PLY STRL (GAUZE/BANDAGES/DRESSINGS) ×2 IMPLANT
GLOVE BIO SURGEON STRL SZ8 (GLOVE) ×2 IMPLANT
GLOVE BIOGEL PI IND STRL 7.0 (GLOVE) IMPLANT
GLOVE BIOGEL PI IND STRL 8 (GLOVE) ×4 IMPLANT
GLOVE ECLIPSE 8.0 STRL XLNG CF (GLOVE) ×2 IMPLANT
GLOVE SURG SS PI 6.5 STRL IVOR (GLOVE) IMPLANT
GLOVE SURG SS PI 7.0 STRL IVOR (GLOVE) IMPLANT
GOWN STRL REUS W/ TWL LRG LVL3 (GOWN DISPOSABLE) ×2 IMPLANT
GOWN STRL REUS W/ TWL XL LVL3 (GOWN DISPOSABLE) ×4 IMPLANT
GOWN STRL REUS W/TWL LRG LVL3 (GOWN DISPOSABLE) ×1
GOWN STRL REUS W/TWL XL LVL3 (GOWN DISPOSABLE) ×2
MAT PREVALON FULL STRYKER (MISCELLANEOUS) IMPLANT
NEEDLE HYPO 22GX1.5 SAFETY (NEEDLE) IMPLANT
NS IRRIG 1000ML POUR BTL (IV SOLUTION) ×2 IMPLANT
PACK BASIN DAY SURGERY FS (CUSTOM PROCEDURE TRAY) ×2 IMPLANT
PAD CAST 4YDX4 CTTN HI CHSV (CAST SUPPLIES) ×2 IMPLANT
PADDING CAST ABS COTTON 4X4 ST (CAST SUPPLIES) IMPLANT
PADDING CAST COTTON 4X4 STRL (CAST SUPPLIES) ×1
PADDING CAST COTTON 6X4 STRL (CAST SUPPLIES) ×2 IMPLANT
PENCIL SMOKE EVACUATOR (MISCELLANEOUS) ×2 IMPLANT
PLATE ACE 100DEG 6HOLE (Plate) IMPLANT
SANITIZER HAND PURELL 535ML FO (MISCELLANEOUS) ×2 IMPLANT
SCREW CORT FT 32X3.5XNONLOCK (Screw) IMPLANT
SCREW CORTICAL 3.5MM  12MM (Screw) ×1 IMPLANT
SCREW CORTICAL 3.5MM  16MM (Screw) ×1 IMPLANT
SCREW CORTICAL 3.5MM  20MM (Screw) ×1 IMPLANT
SCREW CORTICAL 3.5MM  32MM (Screw) ×1 IMPLANT
SCREW CORTICAL 3.5MM 12MM (Screw) IMPLANT
SCREW CORTICAL 3.5MM 16MM (Screw) IMPLANT
SCREW CORTICAL 3.5MM 18MM (Screw) IMPLANT
SCREW CORTICAL 3.5MM 20MM (Screw) IMPLANT
SCREW CORTICAL 3.5MM 22MM (Screw) IMPLANT
SCREW CORTICAL 3.5MM 32MM (Screw) ×1 IMPLANT
SHEET MEDIUM DRAPE 40X70 STRL (DRAPES) ×2 IMPLANT
SLEEVE SCD COMPRESS KNEE MED (STOCKING) ×2 IMPLANT
SPIKE FLUID TRANSFER (MISCELLANEOUS) IMPLANT
SPLINT PLASTER CAST FAST 5X30 (CAST SUPPLIES) ×40 IMPLANT
SPONGE T-LAP 18X18 ~~LOC~~+RFID (SPONGE) ×2 IMPLANT
STOCKINETTE 6  STRL (DRAPES) ×1
STOCKINETTE 6 STRL (DRAPES) ×2 IMPLANT
SUCTION FRAZIER HANDLE 10FR (MISCELLANEOUS) ×1
SUCTION TUBE FRAZIER 10FR DISP (MISCELLANEOUS) ×2 IMPLANT
SUT ETHILON 3 0 PS 1 (SUTURE) ×2 IMPLANT
SUT FIBERWIRE #2 38 T-5 BLUE (SUTURE)
SUT MNCRL AB 3-0 PS2 18 (SUTURE) IMPLANT
SUT VIC AB 2-0 SH 27 (SUTURE)
SUT VIC AB 2-0 SH 27XBRD (SUTURE) ×2 IMPLANT
SUT VICRYL 0 SH 27 (SUTURE) IMPLANT
SUTURE FIBERWR #2 38 T-5 BLUE (SUTURE) IMPLANT
SYR BULB EAR ULCER 3OZ GRN STR (SYRINGE) ×2 IMPLANT
SYR CONTROL 10ML LL (SYRINGE) IMPLANT
TOWEL GREEN STERILE FF (TOWEL DISPOSABLE) ×4 IMPLANT
TUBE CONNECTING 20X1/4 (TUBING) ×2 IMPLANT
UNDERPAD 30X36 HEAVY ABSORB (UNDERPADS AND DIAPERS) ×2 IMPLANT

## 2022-04-19 NOTE — H&P (Addendum)
Shannon Travis is an 36 y.o. female.   Chief Complaint: Right ankle pain HPI: 36 year old female with a past medical history significant for a seizure disorder fell over a week ago injuring the right ankle.  She was seen in clinic last week.  Follow-up radiographs showed displacement of the fracture.  She presents now for operative treatment of this displaced and unstable right ankle fracture.  Past Medical History:  Diagnosis Date   Abnormality of gait    Anxiety    CP (cerebral palsy) ([facility])    mild per mom   Movement disorder    Pre-diabetes    Seizures ([facility])     Past Surgical History:  Procedure Laterality Date   CYSTECTOMY Left    leg    Family History  Problem Relation Age of Onset   Healthy Mother    Social History:  reports that she has never smoked. She has never used smokeless tobacco. She reports that she does not drink alcohol and does not use drugs.  Allergies: No Known Allergies  Medications Prior to Admission  Medication Sig Dispense Refill   FLUoxetine (PROZAC) 40 MG capsule Take 1 capsule (40 mg total) by mouth daily. 90 capsule 3   levETIRAcetam (KEPPRA) 1000 MG tablet TAKE 1 TABLET BY MOUTH TWICE A DAY (Patient taking differently: Take 1,000 mg by mouth 2 (two) times daily.) 180 tablet 3   norethindrone (MICRONOR) 0.35 MG tablet Take 1 tablet by mouth daily.     oxyCODONE (ROXICODONE) 5 MG immediate release tablet Take 1 tablet (5 mg total) by mouth every 4 (four) hours as needed for severe pain. 12 tablet 0   Oyster Shell (OYSTER CALCIUM) 500 MG TABS tablet Take 500 mg of elemental calcium by mouth daily.     topiramate (TOPAMAX) 25 MG tablet Take 100 mg by mouth daily.     VITAMIN D PO Take by mouth.      Results for orders placed or performed during the hospital encounter of April 26, 2022 (from the past 48 hour(s))  Pregnancy, urine POC     Status: None   Collection Time: 04-26-2022 12:28 PM  Result Value Ref Range   Preg Test, Ur NEGATIVE NEGATIVE     Comment:        THE SENSITIVITY OF THIS METHODOLOGY IS >24 mIU/mL    No results found.  Review of Systems no recent fever, chills, nausea, vomiting or changes in her appetite  Blood pressure 105/71, pulse 79, temperature 98.6 F (37 C), temperature source Oral, resp. rate (!) 28, height 5\' 7"  (1.702 m), weight (!) 138.3 kg, SpO2 96 %. Physical Exam well-nourished well-developed woman in no apparent distress.  Alert and oriented.  Normal mood and affect.  Gait is nonweightbearing on the right.  The right ankle is splinted.  Slight swelling around the ankle.  Active plantarflexion and dorsiflexion strength of the toes.  No lymphadenopathy.  Pulses are palpable.  Assessment/Plan Right ankle displaced lateral malleolus fracture -to the operating room today for open reduction with internal fixation.  The risks and benefits of the alternative treatment options have been discussed in detail.  The patient wishes to proceed with surgery and specifically understands risks of bleeding, infection, nerve damage, blood clots, need for additional surgery, amputation and death.   Wylene Simmer, MD 04/26/22, 12:59 PM   Addendum: After completion of the surgery I spoke with the patient's mother.  Mom cares for the patient at home and reports that she has been walking on  the injured foot and has been unable to manage crutches.  After discussion in clinic last week it was decided that a knee scooter would be reasonable for her to use around the house.  Mom reports that the patient has been unable to use the knee scooter or crutches.  They have no way to get her into the house due to stairs.  At this point it is medically necessary to admit her to the hospital for observation overnight and for physical therapy and Occupational Therapy tomorrow.

## 2022-04-19 NOTE — Progress Notes (Signed)
Assisted Dr. Miller with right, popliteal, ultrasound guided block. Side rails up, monitors on throughout procedure. See vital signs in flow sheet. Tolerated Procedure well. ?

## 2022-04-19 NOTE — Transfer of Care (Signed)
Immediate Anesthesia Transfer of Care Note  Patient: Shannon Travis  Procedure(s) Performed: OPEN REDUCTION INTERNAL FIXATION (ORIF) right ANKLE FRACTURE, lateral malleolus (Right: Ankle)  Patient Location: PACU  Anesthesia Type:GA combined with regional for post-op pain  Level of Consciousness: drowsy and patient cooperative  Airway & Oxygen Therapy: Patient Spontanous Breathing and Patient connected to face mask oxygen  Post-op Assessment: Report given to RN and Post -op Vital signs reviewed and stable  Post vital signs: Reviewed and stable  Last Vitals:  Vitals Value Taken Time  BP    Temp    Pulse 86 04/19/22 1421  Resp 19 04/19/22 1421  SpO2 100 % 04/19/22 1421  Vitals shown include unvalidated device data.  Last Pain:  Vitals:   04/19/22 1229  TempSrc: Oral  PainSc: 10-Worst pain ever      Patients Stated Pain Goal: 5 (51/02/58 5277)  Complications: No notable events documented.

## 2022-04-19 NOTE — Anesthesia Procedure Notes (Signed)
Procedure Name: LMA Insertion Date/Time: 04/19/2022 1:18 PM  Performed by: Rondel Episcopo, Ernesta Amble, CRNAPre-anesthesia Checklist: Patient identified, Emergency Drugs available, Suction available and Patient being monitored Patient Re-evaluated:Patient Re-evaluated prior to induction Oxygen Delivery Method: Circle system utilized Preoxygenation: Pre-oxygenation with 100% oxygen Induction Type: IV induction Ventilation: Mask ventilation without difficulty LMA: LMA inserted LMA Size: 4.0 Number of attempts: 1 Airway Equipment and Method: Bite block Placement Confirmation: positive ETCO2 Tube secured with: Tape Dental Injury: Teeth and Oropharynx as per pre-operative assessment

## 2022-04-19 NOTE — Anesthesia Postprocedure Evaluation (Signed)
Anesthesia Post Note  Patient: Shannon Travis  Procedure(s) Performed: OPEN REDUCTION INTERNAL FIXATION (ORIF) right ANKLE FRACTURE, lateral malleolus (Right: Ankle)     Patient location during evaluation: PACU Anesthesia Type: General Level of consciousness: awake and alert Pain management: pain level controlled Vital Signs Assessment: post-procedure vital signs reviewed and stable Respiratory status: spontaneous breathing, nonlabored ventilation and respiratory function stable Cardiovascular status: blood pressure returned to baseline and stable Postop Assessment: no apparent nausea or vomiting Anesthetic complications: no   No notable events documented.  Last Vitals:  Vitals:   04/19/22 1445 04/19/22 1500  BP: 117/77 103/74  Pulse: 79 80  Resp: (!) 9 16  Temp:  (!) 36.2 C  SpO2: 98% 96%    Last Pain:  Vitals:   04/19/22 1500  TempSrc:   PainSc: 0-No pain                 Lynda Rainwater

## 2022-04-19 NOTE — Discharge Instructions (Addendum)
Shannon Arthurs, MD [facility]  Please read the following information regarding your care after surgery.  Medications  You only need a prescription for the narcotic pain medicine (ex. oxycodone, Percocet, Norco).  All of the other medicines listed below are available over the counter. X Aleve 2 pills twice a day for the first 3 days after surgery. X acetominophen (Tylenol) 650 mg every 4-6 hours as you need for minor to moderate pain X oxycodone as prescribed for severe pain  Narcotic pain medicine (ex. oxycodone, Percocet, Vicodin) will cause constipation.  To prevent this problem, take the following medicines while you are taking any pain medicine. X docusate sodium (Colace) 100 mg twice a day X senna (Senokot) 2 tablets twice a day  X To help prevent blood clots, take Xarelto for two weeks after surgery.  Then switch to a baby aspirin daily for a month.  You should also get up every hour while you are awake to move around.    Weight Bearing X  Do not bear any weight on the operated leg or foot.  Cast / Splint / Dressing X  Keep your splint, cast or dressing clean and dry.  Don't put anything (coat hanger, pencil, etc) down inside of it.  If it gets damp, use a hair dryer on the cool setting to dry it.  If it gets soaked, call the office to schedule an appointment for a cast change.  After your dressing, cast or splint is removed; you may shower, but do not soak or scrub the wound.  Allow the water to run over it, and then gently pat it dry.  Swelling It is normal for you to have swelling where you had surgery.  To reduce swelling and pain, keep your toes above your nose for at least 3 days after surgery.  It may be necessary to keep your foot or leg elevated for several weeks.  If it hurts, it should be elevated.  Follow Up Call my office at [phone] when you are discharged from the hospital or surgery center to schedule an appointment to be seen six weeks after surgery.  Call my  office at [phone] if you develop a fever >101.5 F, nausea, vomiting, bleeding from the surgical site or severe pain.       Post Anesthesia Home Care Instructions  Activity: Get plenty of rest for the remainder of the day. A responsible individual must stay with you for 24 hours following the procedure.  For the next 24 hours, DO NOT: -Drive a car -Advertising copywriter -Drink alcoholic beverages -Take any medication unless instructed by your physician -Make any legal decisions or sign important papers.  Meals: Start with liquid foods such as gelatin or soup. Progress to regular foods as tolerated. Avoid greasy, spicy, heavy foods. If nausea and/or vomiting occur, drink only clear liquids until the nausea and/or vomiting subsides. Call your physician if vomiting continues.  Special Instructions/Symptoms: Your throat may feel dry or sore from the anesthesia or the breathing tube placed in your throat during surgery. If this causes discomfort, gargle with warm salt water. The discomfort should disappear within 24 hours.  If you had a scopolamine patch placed behind your ear for the management of post- operative nausea and/or vomiting:  1. The medication in the patch is effective for 72 hours, after which it should be removed.  Wrap patch in a tissue and discard in the trash. Wash hands thoroughly with soap and water. 2. You may remove the patch  earlier than 72 hours if you experience unpleasant side effects which may include dry mouth, dizziness or visual disturbances. 3. Avoid touching the patch. Wash your hands with soap and water after contact with the patch.    Regional Anesthesia Blocks  1. Numbness or the inability to move the "blocked" extremity may last from 3-48 hours after placement. The length of time depends on the medication injected and your individual response to the medication. If the numbness is not going away after 48 hours, call your surgeon.  2. The extremity that is  blocked will need to be protected until the numbness is gone and the  Strength has returned. Because you cannot feel it, you will need to take extra care to avoid injury. Because it may be weak, you may have difficulty moving it or using it. You may not know what position it is in without looking at it while the block is in effect.  3. For blocks in the legs and feet, returning to weight bearing and walking needs to be done carefully. You will need to wait until the numbness is entirely gone and the strength has returned. You should be able to move your leg and foot normally before you try and bear weight or walk. You will need someone to be with you when you first try to ensure you do not fall and possibly risk injury.  4. Bruising and tenderness at the needle site are common side effects and will resolve in a few days.  5. Persistent numbness or new problems with movement should be communicated to the surgeon or the [place] [phone] [place] [phone]).

## 2022-04-19 NOTE — Anesthesia Procedure Notes (Signed)
Anesthesia Regional Block: Popliteal block   Pre-Anesthetic Checklist: , timeout performed,  Correct Patient, Correct Site, Correct Laterality,  Correct Procedure, Correct Position, site marked,  Risks and benefits discussed,  Surgical consent,  Pre-op evaluation,  At surgeon's request and post-op pain management  Laterality: Right  Prep: chloraprep       Needles:  Injection technique: Single-shot  Needle Type: Stimiplex     Needle Length: 9cm  Needle Gauge: 21     Additional Needles:   Procedures:,,,, ultrasound used (permanent image in chart),,    Narrative:  Start time: 04/19/2022 12:53 PM End time: 04/19/2022 12:58 PM Injection made incrementally with aspirations every 5 mL.  Performed by: Personally  Anesthesiologist: Lynda Rainwater, MD

## 2022-04-19 NOTE — Progress Notes (Signed)
Report given to [facility]. 

## 2022-04-19 NOTE — Op Note (Signed)
04/19/2022  2:19 PM  PATIENT:  Shannon Travis  36 y.o. female  PRE-OPERATIVE DIAGNOSIS:  right ankle lateral malleolus fracture  POST-OPERATIVE DIAGNOSIS:  right ankle lateral malleolus fracture  Procedure(s): 1.  Open treatment right ankle lateral malleolus fracture with internal fixation 2.  Stress examination of the right ankle under fluoroscopy 3.  AP, mortise and lateral radiographs of the right ankle  SURGEON:  Toni Arthurs, MD  ASSISTANT: None  ANESTHESIA:   General, regional  EBL:  minimal   TOURNIQUET:   Total Tourniquet Time Documented: Thigh (Right) - 32 minutes Total: Thigh (Right) - 32 minutes  COMPLICATIONS:  None apparent  DISPOSITION:  Extubated, awake and stable to recovery.  INDICATION FOR PROCEDURE: The patient is a 36 year old female with a past medical history significant for seizure disorder.  She fell recently injuring her right ankle.  Radiographs in the office last week revealed a displaced lateral malleolus fracture.  She presents now for operative treatment of this displaced and unstable right ankle injury.  The risks and benefits of the alternative treatment options have been discussed in detail.  The patient wishes to proceed with surgery and specifically understands risks of bleeding, infection, nerve damage, blood clots, need for additional surgery, amputation and death.   PROCEDURE IN DETAIL:  After pre operative consent was obtained, and the correct operative site was identified, the patient was brought to the operating room and placed supine on the OR table.  Anesthesia was administered.  Pre-operative antibiotics were administered.  A surgical timeout was taken.  The right lower extremity was prepped and draped in standard sterile fashion with a tourniquet around the thigh.  The extremity was elevated and the tourniquet was inflated to 350 mmHg.  A longitudinal incision was made over the lateral malleolus.  Dissection was carried sharply down  through the subcutaneous tissues to the fracture site.  The fracture was cleaned of all hematoma and irrigated.  The fracture was reduced and held with a lobster claw clamp.  A 3.5 mm fully threaded lag screw was inserted from anterior to posterior across the fracture site.  A 6 hole one third tubular plate from the [employer] small frag set was contoured to fit the lateral malleolus.  It was secured distally with 3 unicortical screws and proximally with 3 bicortical screws.  AP, mortise and lateral radiographs of the right ankle were obtained.  These show interval reduction of the lateral malleolus fracture.  Hardware is appropriately positioned and of the appropriate lengths.  Stress examination was then performed.  A mortise view was obtained.  Dorsiflexion and external rotation stress was applied to the supinated forefoot.  There was no widening evident at the syndesmosis or ankle mortise.  The incision was then irrigated copiously and sprinkled with vancomycin powder.  Subcutaneous tissues were approximated with 3-0 Monocryl.  The skin incision was closed with horizontal mattress sutures of 3-0 nylon.  Sterile dressings were applied followed by a well-padded short leg splint.  The tourniquet was released after application of the dressings.  The patient was awakened from anesthesia and transported to the recovery room in stable condition.  FOLLOW UP PLAN: Nonweightbearing on the right lower extremity for 6 weeks postop.  Xarelto for DVT prophylaxis due to the patient's obesity.  Follow-up in the office in 2 weeks for suture removal and conversion to a short leg cast.   RADIOGRAPHS: AP, mortise and lateral radiographs of the right ankle are obtained intraoperatively.  These show interval reduction  and fixation of the lateral malleolus fracture.  Hardware is appropriately positioned and of the appropriate lengths.  No other acute injuries are noted.

## 2022-04-19 NOTE — Progress Notes (Signed)
Report called to Loma Sousa, RN on 6N.

## 2022-04-19 NOTE — Anesthesia Preprocedure Evaluation (Signed)
Anesthesia Evaluation  Patient identified by MRN, date of birth, ID band Patient awake    Reviewed: Allergy & Precautions, NPO status , Patient's Chart, lab work & pertinent test results  Airway Mallampati: II  TM Distance: >3 FB Neck ROM: Full    Dental no notable dental hx.    Pulmonary neg pulmonary ROS,    Pulmonary exam normal breath sounds clear to auscultation       Cardiovascular negative cardio ROS Normal cardiovascular exam Rhythm:Regular Rate:Normal     Neuro/Psych Seizures -,  Anxiety negative psych ROS   GI/Hepatic negative GI ROS, Neg liver ROS,   Endo/Other  Morbid obesity  Renal/GU negative Renal ROS  negative genitourinary   Musculoskeletal  (+) Arthritis , Osteoarthritis,    Abdominal (+) + obese,   Peds negative pediatric ROS (+)  Hematology negative hematology ROS (+)   Anesthesia Other Findings   Reproductive/Obstetrics negative OB ROS                             Anesthesia Physical Anesthesia Plan  ASA: 3  Anesthesia Plan: General   Post-op Pain Management: Regional block* and Minimal or no pain anticipated   Induction: Intravenous  PONV Risk Score and Plan: 3 and Ondansetron, Dexamethasone, Midazolam and Treatment may vary due to age or medical condition  Airway Management Planned: LMA  Additional Equipment:   Intra-op Plan:   Post-operative Plan: Extubation in OR  Informed Consent: I have reviewed the patients History and Physical, chart, labs and discussed the procedure including the risks, benefits and alternatives for the proposed anesthesia with the patient or authorized representative who has indicated his/her understanding and acceptance.     Dental advisory given  Plan Discussed with: CRNA  Anesthesia Plan Comments:         Anesthesia Quick Evaluation

## 2022-04-20 ENCOUNTER — Other Ambulatory Visit (HOSPITAL_COMMUNITY): Payer: Self-pay

## 2022-04-20 ENCOUNTER — Telehealth (HOSPITAL_COMMUNITY): Payer: Self-pay | Admitting: Pharmacy Technician

## 2022-04-20 NOTE — Evaluation (Signed)
Occupational Therapy Evaluation Patient Details Name: Shannon Travis MRN: [id number] DOB: 1986-01-05 Today's Date: 04/20/2022   History of Present Illness Pt is a 36 yo female who fell one week ago and sustained a R ankle fx. Pt underwent ORIF to R ankle 10/5. PMH: mild CP, sz d/o. Pt attempted to use scooter and crutches at home PTA and was unable to manage this.   Clinical Impression   Pt admitted with the above diagnosis and has the deficits listed below. Pt would benefit from cont OT to increase independence in basic adls and adl transfers so she can eventually dc back home with her mother. With pt's history of CP, BUE tremors and a sz d/o, pt struggles greatly getting onto her feet and managing a NWB RLE.  Feel this pt might be a good rehab candidate in that it would prepare her to do transfers and basic home skills more independently so she could d/c home. Pt was very independent PTA using rollator only out in community and wants to be independent again.  Mother works some so pt is alone for short periods of time during the day. Feel AIR could get this pt to the level she needs to be at to return to this setting.  Will continue to assess.      Recommendations for follow up therapy are one component of a multi-disciplinary discharge planning process, led by the attending physician.  Recommendations may be updated based on patient status, additional functional criteria and insurance authorization.   Follow Up Recommendations  Acute inpatient rehab (3hours/day)    Assistance Recommended at Discharge Frequent or constant Supervision/Assistance  Patient can return home with the following Two people to help with walking and/or transfers;Two people to help with bathing/dressing/bathroom;Assistance with cooking/housework;Assist for transportation;Help with stairs or ramp for entrance    Functional Status Assessment  Patient has had a recent decline in their functional status and demonstrates  the ability to make significant improvements in function in a reasonable and predictable amount of time.  Equipment Recommendations  Other (comment) (tbd)    Recommendations for Other Services Rehab consult     Precautions / Restrictions Precautions Precautions: Fall Required Braces or Orthoses: Splint/Cast Splint/Cast: RLE Splint/Cast - Date Prophylactic Dressing Applied (if applicable): 75/64/33 Restrictions Weight Bearing Restrictions: Yes RLE Weight Bearing: Non weight bearing      Mobility Bed Mobility Overal bed mobility: Needs Assistance Bed Mobility: Rolling, Supine to Sit, Sit to Supine Rolling: Min assist   Supine to sit: Min assist Sit to supine: Min assist   General bed mobility comments: Pt moves very well in the bed using bedrails.    Transfers Overall transfer level: Needs assistance Equipment used: Rolling walker (2 wheels), 2 person hand held assist Transfers: Sit to/from Stand Sit to Stand: Total assist, +2 physical assistance           General transfer comment: Did not achieve full standing position but did clear bottom off bed. Pt's tremors become very significant with exursion and pt unable to bear all weight through arms and LLE      Balance Overall balance assessment: Needs assistance Sitting-balance support: Feet supported Sitting balance-Leahy Scale: Good       Standing balance-Leahy Scale: Zero Standing balance comment: unable to fully stand                           ADL either performed or assessed with clinical judgement   ADL  Overall ADL's : Needs assistance/impaired Eating/Feeding: Independent;Sitting   Grooming: Wash/dry hands;Wash/dry face;Oral care;Set up;Sitting   Upper Body Bathing: Set up;Sitting   Lower Body Bathing: Maximal assistance;Sit to/from stand;Cueing for compensatory techniques;+2 for physical assistance   Upper Body Dressing : Minimal assistance;Sitting   Lower Body Dressing: Maximal  assistance;+2 for physical assistance;Sit to/from stand;Cueing for compensatory techniques Lower Body Dressing Details (indicate cue type and reason): Pt will have better success dressing in bed at this point and rolling side to side to pull pants up. Toilet Transfer: Total assistance;+2 for physical assistance;Transfer board;Requires drop arm Toilet Transfer Details (indicate cue type and reason): Pt unable to stand on LLE only at this point. Must address lateral scoot transfers or ant/post tranfers for toileting Toileting- Clothing Manipulation and Hygiene: Maximal assistance;Sitting/lateral lean       Functional mobility during ADLs: Maximal assistance;+2 for physical assistance General ADL Comments: Pt limited with all LE adls due to pain and inability to stand and not bear weight through RLE.     Vision Baseline Vision/History: 0 No visual deficits Ability to See in Adequate Light: 0 Adequate Patient Visual Report: No change from baseline Vision Assessment?: No apparent visual deficits     Perception     Praxis      Pertinent Vitals/Pain Pain Assessment Pain Assessment: Faces Faces Pain Scale: Hurts even more Pain Location: R lower back and R leg Pain Descriptors / Indicators: Aching, Guarding, Throbbing Pain Intervention(s): Limited activity within patient's tolerance, Monitored during session, Repositioned, Utilized relaxation techniques     Hand Dominance Right   Extremity/Trunk Assessment Upper Extremity Assessment Upper Extremity Assessment: RUE deficits/detail;LUE deficits/detail RUE Deficits / Details: generalized weakness and significant tremors RUE Sensation: WNL RUE Coordination: decreased fine motor;decreased gross motor LUE Deficits / Details: General weakness and tremors with movement. LUE Sensation: WNL LUE Coordination: decreased fine motor;decreased gross motor   Lower Extremity Assessment Lower Extremity Assessment: Defer to PT evaluation   Cervical  / Trunk Assessment Cervical / Trunk Assessment: Normal   Communication Communication Communication: No difficulties   Cognition Arousal/Alertness: Awake/alert Behavior During Therapy: Anxious Overall Cognitive Status: History of cognitive impairments - at baseline                                 General Comments: Pt very anxious during session becoming very upset when she could not stand. Pt wtih significant tremors that she has at baseline that increase when giving effort to stand.  Pt's whole body trembling when attempting to stand.     General Comments  Pt  most limited by NWB RLE, tremors in UE, anxiety with movement and general weakness.    Exercises     Shoulder Instructions      Home Living Family/patient expects to be discharged to:: Private residence Living Arrangements: Parent Available Help at Discharge: Family;Other (Comment) (mother works and unsure if 24 hour assist available) Type of Home: House Home Access: Stairs to enter [employer] of Steps: 3 Entrance Stairs-Rails: Right;Left;Can reach both Home Layout: One level     Bathroom Shower/Tub: Producer, television/film/video: Handicapped height     Home Equipment: Rollator (4 wheels);Cane - single point;Crutches          Prior Functioning/Environment Prior Level of Function : Independent/Modified Independent;History of Falls (last six months)             Mobility Comments: Pt uses rollator when she  leaves the home. Pt has had falls in the past. ADLs Comments: Pt can do all basic adls without assist but does not drive.        OT Problem List: Decreased strength;Decreased activity tolerance;Impaired balance (sitting and/or standing);Decreased coordination;Decreased cognition;Decreased safety awareness;Decreased knowledge of use of DME or AE;Decreased knowledge of precautions;Obesity;Impaired UE functional use;Pain      OT Treatment/Interventions: Self-care/ADL  training;Therapeutic activities;Balance training;DME and/or AE instruction    OT Goals(Current goals can be found in the care plan section) Acute Rehab OT Goals Patient Stated Goal: to be able to walk again OT Goal Formulation: With patient Time For Goal Achievement: 05/04/22 Potential to Achieve Goals: Good ADL Goals Pt Will Perform Grooming: with supervision;standing Pt Will Perform Lower Body Bathing: with supervision;sit to/from stand Pt Will Perform Lower Body Dressing: with supervision;sit to/from stand Pt Will Transfer to Toilet: with min assist;stand pivot transfer;bedside commode Pt Will Perform Toileting - Clothing Manipulation and hygiene: with min assist;sit to/from stand  OT Frequency: Min 2X/week    Co-evaluation              AM-PAC OT "6 Clicks" Daily Activity     Outcome Measure Help from another person eating meals?: None Help from another person taking care of personal grooming?: A Little Help from another person toileting, which includes using toliet, bedpan, or urinal?: A Lot Help from another person bathing (including washing, rinsing, drying)?: A Lot Help from another person to put on and taking off regular upper body clothing?: A Little Help from another person to put on and taking off regular lower body clothing?: A Lot 6 Click Score: 16   End of Session Equipment Utilized During Treatment: Gait belt;Rolling walker (2 wheels) Nurse Communication: Mobility status;Need for lift equipment;Weight bearing status  Activity Tolerance: Patient limited by fatigue Patient left: in bed;with call bell/phone within reach;with bed alarm set  OT Visit Diagnosis: Unsteadiness on feet (R26.81)                Time: 1601-0932 OT Time Calculation (min): 28 min Charges:  OT General Charges $OT Visit: 1 Visit OT Evaluation $OT Eval Moderate Complexity: 1 Mod OT Treatments $Self Care/Home Management : 8-22 mins  Hope Budds 04/20/2022, 9:34 AM

## 2022-04-20 NOTE — TOC Benefit Eligibility Note (Signed)
Patient Teacher, English as a foreign language completed.    The patient is currently admitted and upon discharge could be taking Eliquis 2.5 mg.  The current 30 day co-pay is $4.00.   The patient is currently admitted and upon discharge could be taking Xarelto 10 mg.  The current 30 day co-pay is $4.00.   The patient is insured through [place] [place] Patient Advocate Specialist [place] Patient Advocate Team Direct Number: [phone]  Fax: [phone]

## 2022-04-20 NOTE — Evaluation (Signed)
Physical Therapy Evaluation Patient Details Name: Shannon Travis MRN: [id number] DOB: Apr 28, 1986 Today's Date: 04/20/2022  History of Present Illness  36 y/o female admitted on 04/19/22 following R ankle ORIF after fall x 1 week ago after seizure. PMH: seizures, CP, pre-diabetes  Clinical Impression  Patient admitted with the above. PTA, patient lives with mom and was independent up until past week after she fell and broke R ankle. Per mom, patient unable to maintain NWB prior to surgery despite crutches and knee scooter. Patient currently presents with weakness, impaired balance, decreased activity tolerance, and impaired functional mobility. Patient with fully body tremors associated with anxious behaviors but improves with demonstration of task and increased time to allow for patient to process tasks. Patient required minA for bed mobility and maxA+2 for sit to stand with RW but unable to come into upright posture due to UE weakness. Would benefit from slideboard training to attempt OOB next session. Patient will benefit from skilled PT services during acute stay to address listed deficits. Recommend SNF at discharge to maximize functional mobility and safety to decrease caregiver burden.        Recommendations for follow up therapy are one component of a multi-disciplinary discharge planning process, led by the attending physician.  Recommendations may be updated based on patient status, additional functional criteria and insurance authorization.  Follow Up Recommendations Skilled nursing-short term rehab (<3 hours/day) Can patient physically be transported by private vehicle: No    Assistance Recommended at Discharge Frequent or constant Supervision/Assistance  Patient can return home with the following  Two people to help with walking and/or transfers;A lot of help with bathing/dressing/bathroom;Assistance with cooking/housework;Assist for transportation;Help with stairs or ramp for  entrance    Equipment Recommendations Rolling Edelmira Gallogly (2 wheels);Wheelchair (measurements PT);Wheelchair cushion (measurements PT);Hospital bed  Recommendations for Other Services       Functional Status Assessment Patient has had a recent decline in their functional status and demonstrates the ability to make significant improvements in function in a reasonable and predictable amount of time.     Precautions / Restrictions Precautions Precautions: Fall Required Braces or Orthoses: Splint/Cast Splint/Cast: RLE Splint/Cast - Date Prophylactic Dressing Applied (if applicable): 81/44/81 Restrictions Weight Bearing Restrictions: Yes RLE Weight Bearing: Non weight bearing      Mobility  Bed Mobility Overal bed mobility: Needs Assistance Bed Mobility: Rolling, Supine to Sit, Sit to Supine Rolling: Min assist   Supine to sit: Min assist Sit to supine: Min assist   General bed mobility comments: assist for repositioning hips with all aspects of bed mobility. +use of rails    Transfers Overall transfer level: Needs assistance Equipment used: Rolling Mizael Sagar (2 wheels), 2 person hand held assist Transfers: Sit to/from Stand Sit to Stand: Max assist, +2 physical assistance, +2 safety/equipment           General transfer comment: able to come into standing with maxA+2 but unable to stand upright. <25% weight put through R LE. Cues for hand placement and demonstration on how to stand with RW. Increased time to complete. Unable to transfer OOB this date. Will likely benefit from slideboard transfer training    Ambulation/Gait                  Stairs            Wheelchair Mobility    Modified Rankin (Stroke Patients Only)       Balance Overall balance assessment: Needs assistance Sitting-balance support: Feet supported Sitting balance-Leahy Scale:  Good     Standing balance support: Bilateral upper extremity supported Standing balance-Leahy Scale: Poor                                Pertinent Vitals/Pain Pain Assessment Pain Assessment: Faces Faces Pain Scale: Hurts even more Pain Location: R lower back and R leg Pain Descriptors / Indicators: Aching, Guarding, Throbbing Pain Intervention(s): Monitored during session, Repositioned, Limited activity within patient's tolerance    Home Living Family/patient expects to be discharged to:: Private residence Living Arrangements: Parent Available Help at Discharge: Family;Other (Comment) (mother works and unsure if 24 hour assist available) Type of Home: House Home Access: Stairs to enter Entrance Stairs-Rails: Right;Left;Can reach both Entrance Stairs-Number of Steps: 3   Home Layout: One level Home Equipment: Rollator (4 wheels);Cane - single point;Crutches      Prior Function Prior Level of Function : Independent/Modified Independent;History of Falls (last six months)             Mobility Comments: Pt uses rollator when she leaves the home. Pt has had falls in the past. ADLs Comments: Pt can do all basic adls without assist but does not drive.     Hand Dominance   Dominant Hand: Right    Extremity/Trunk Assessment   Upper Extremity Assessment Upper Extremity Assessment: Defer to OT evaluation    Lower Extremity Assessment Lower Extremity Assessment: Generalized weakness;RLE deficits/detail RLE Deficits / Details: difficult to assess R ankle due to split. Able wiggle toes and perform SLR.    Cervical / Trunk Assessment Cervical / Trunk Assessment: Normal  Communication   Communication: No difficulties  Cognition Arousal/Alertness: Awake/alert Behavior During Therapy: Anxious Overall Cognitive Status: History of cognitive impairments - at baseline                                 General Comments: patient benefits from visual explanation and step by step cues at her pace to limit full body tremors and anxious behaviors. Relaxes with laughter  and time given for her to focus on task        General Comments      Exercises     Assessment/Plan    PT Assessment Patient needs continued PT services  PT Problem List Decreased strength;Decreased balance;Decreased activity tolerance;Decreased mobility;Decreased cognition;Decreased knowledge of use of DME;Decreased safety awareness;Decreased knowledge of precautions       PT Treatment Interventions DME instruction;Stair training;Gait training;Therapeutic exercise;Functional mobility training;Therapeutic activities;Balance training;Patient/family education    PT Goals (Current goals can be found in the Care Plan section)  Acute Rehab PT Goals Patient Stated Goal: to be able to stand PT Goal Formulation: With patient Time For Goal Achievement: 05/04/22 Potential to Achieve Goals: Good    Frequency Min 3X/week     Co-evaluation               AM-PAC PT "6 Clicks" Mobility  Outcome Measure Help needed turning from your back to your side while in a flat bed without using bedrails?: A Little Help needed moving from lying on your back to sitting on the side of a flat bed without using bedrails?: A Little Help needed moving to and from a bed to a chair (including a wheelchair)?: Total Help needed standing up from a chair using your arms (e.g., wheelchair or bedside chair)?: Total Help needed to walk in hospital room?:  Total Help needed climbing 3-5 steps with a railing? : Total 6 Click Score: 10    End of Session Equipment Utilized During Treatment: Gait belt Activity Tolerance: Patient limited by pain;Patient tolerated treatment well Patient left: in bed;with call bell/phone within reach Nurse Communication: Mobility status PT Visit Diagnosis: Unsteadiness on feet (R26.81);Muscle weakness (generalized) (M62.81);History of falling (Z91.81);Other abnormalities of gait and mobility (R26.89)    Time: 0355-9741 PT Time Calculation (min) (ACUTE ONLY): 27 min   Charges:    PT Evaluation $PT Eval Moderate Complexity: 1 Mod PT Treatments $Therapeutic Activity: 8-22 mins        Harvel Meskill A. Dan Humphreys PT, DPT Acute Rehabilitation Services Office [phone]   Viviann Spare 04/20/2022, 4:55 PM

## 2022-04-20 NOTE — Progress Notes (Addendum)
   Inpatient Rehab Admissions Coordinator :  Per therapy recommendations patient was screened for CIR candidacy by Danne Baxter RN MSN. Patient does not appear to demonstrate the medical neccesity for a [place] /CIR admit. I will not place a Rehab Consult. Recommend other Rehab Venues to be pursued. Please contact me with any questions. TOC RN CM made aware.  Danne Baxter RN MSN Admissions Coordinator [phone]

## 2022-04-20 NOTE — Telephone Encounter (Signed)
Pharmacy Patient Advocate Encounter  Insurance verification completed.    The patient is insured through [place] Medicaid   The patient is currently admitted and ran test claims for the following: Eliquis, Xarelto.  Copays and coinsurance results were relayed to Inpatient clinical team.      

## 2022-04-20 NOTE — Progress Notes (Signed)
Subjective: 1 Day Post-Op Procedure(s) (LRB): OPEN REDUCTION INTERNAL FIXATION (ORIF) right ANKLE FRACTURE, lateral malleolus (Right)  Patient reports pain as mild to moderate.  Tolerating POs well.  Admits to flatus.  Denies fever, chills, N/V, CP, SOB.  Resting comfortably in bed.  Objective:   VITALS:  Temp:  [97.1 F (36.2 C)-98.7 F (37.1 C)] 97.6 F (36.4 C) (10/06 0500) Pulse Rate:  [76-98] 76 (10/06 0500) Resp:  [9-28] 19 (10/06 0500) BP: (103-127)/(71-94) 112/75 (10/06 0500) SpO2:  [95 %-100 %] 100 % (10/06 0500) Weight:  [138.3 kg] 138.3 kg (10/05 1229)  General: Overweight patient in NAD. Psych:  Appropriate mood and affect. Neuro:  A&O x 3, Moving all extremities, sensation intact to light touch HEENT:  EOMs intact Chest:  Even non-labored respirations Skin: SLS C/D/I, no rashes or lesions Extremities: warm/dry, no visible edema, erythema or echymosis.  No lymphadenopathy. Pulses: [place] 2+ MSK:  ROM: EHL/FHL intact, MMT: able to perform quad set   LABS Recent Labs    04/19/22 1810  HGB 14.0  WBC 6.9  PLT 323   Recent Labs    04/19/22 1810  CREATININE 0.82   No results for input(s): "LABPT", "INR" in the last 72 hours.   Assessment/Plan: 1 Day Post-Op Procedure(s) (LRB): OPEN REDUCTION INTERNAL FIXATION (ORIF) right ANKLE FRACTURE, lateral malleolus (Right)  -NWB R LE -Up with therapy -DVT ppx:  Lovenox in house; transition to Xarelto upon D/C -Disp: pending -Spoke to patient's mother on phone.  She has picked up D/C scripts for patient and has them ready for her upon D/C. -Plan for 2 week outpatient post-op visit.  Mechele Claude PA-C [facility] Office:  [phone]

## 2022-04-21 DIAGNOSIS — S8261XA Displaced fracture of lateral malleolus of right fibula, initial encounter for closed fracture: Secondary | ICD-10-CM | POA: Diagnosis present

## 2022-04-21 DIAGNOSIS — G809 Cerebral palsy, unspecified: Secondary | ICD-10-CM | POA: Diagnosis present

## 2022-04-21 DIAGNOSIS — Z79899 Other long term (current) drug therapy: Secondary | ICD-10-CM | POA: Diagnosis not present

## 2022-04-21 DIAGNOSIS — Z751 Person awaiting admission to adequate facility elsewhere: Secondary | ICD-10-CM | POA: Diagnosis not present

## 2022-04-21 DIAGNOSIS — W19XXXA Unspecified fall, initial encounter: Secondary | ICD-10-CM | POA: Diagnosis present

## 2022-04-21 DIAGNOSIS — G40909 Epilepsy, unspecified, not intractable, without status epilepticus: Secondary | ICD-10-CM | POA: Diagnosis present

## 2022-04-21 DIAGNOSIS — R7303 Prediabetes: Secondary | ICD-10-CM | POA: Diagnosis present

## 2022-04-21 DIAGNOSIS — E785 Hyperlipidemia, unspecified: Secondary | ICD-10-CM | POA: Diagnosis present

## 2022-04-21 DIAGNOSIS — Z6841 Body Mass Index (BMI) 40.0 and over, adult: Secondary | ICD-10-CM | POA: Diagnosis not present

## 2022-04-21 NOTE — Progress Notes (Signed)
Subjective: 2 Days Post-Op Procedure(s) (LRB): OPEN REDUCTION INTERNAL FIXATION (ORIF) right ANKLE FRACTURE, lateral malleolus (Right) Patient seen in rounds for Dr. Doran Durand Patient reports pain as mild.  Received Pain meds prior to my visit She is doing well, having difficulty with getting up with PT and requiring a lot of assist   Objective: Vital signs in last 24 hours: Temp:  [98 F (36.7 C)-98.6 F (37 C)] 98.1 F (36.7 C) (10/07 0507) Pulse Rate:  [72-89] 85 (10/07 0507) Resp:  [17-19] 17 (10/07 0507) BP: (111-147)/(68-130) 111/77 (10/07 0507) SpO2:  [96 %-100 %] 99 % (10/07 0507)  Intake/Output from previous day: 10/06 0701 - 10/07 0700 In: 240 [P.O.:240] Out: 725 [Urine:725] Intake/Output this shift: No intake/output data recorded.  Recent Labs    04/19/22 1810  HGB 14.0   Recent Labs    04/19/22 1810  WBC 6.9  RBC 4.56  HCT 41.8  PLT 323   Recent Labs    04/19/22 1810  CREATININE 0.82   No results for input(s): "LABPT", "INR" in the last 72 hours.  Neurovascular intact Sensation intact distally Intact pulses distally Incision: dressing C/D/I Compartment soft Able to move toes   Assessment/Plan: 2 Days Post-Op Procedure(s) (LRB): OPEN REDUCTION INTERNAL FIXATION (ORIF) right ANKLE FRACTURE, lateral malleolus (Right) Up with therapy, She will continue to work with PT. It does appear that they are recommending possible SNF placement.  NWB RLE DVT ppx: Lovenox while in house, Xarelto once d/c    Cordelia Pen Fusako Tanabe,PA-C [facility] [phone] 04/21/2022, 7:04 AM

## 2022-04-21 NOTE — Progress Notes (Addendum)
Occupational Therapy Treatment Patient Details Name: Shannon Travis MRN: [id number] DOB: [birth year] Today's Date: 04/21/2022   History of present illness 36 y/o female admitted on 04/19/22 following R ankle ORIF after fall x 1 week ago after seizure. PMH: seizures, CP, pre-diabetes   OT comments  Pt progressing towards established OT goals and is highly motivated. Pt performing bed mobility with Min Guard -Min A. Pt performing sit<>Stand from EOB with Mod A +2 and RW. Pt requiring Max A for peri care. Pt performing lateral scoot towards HOB with Min A +2. Pt continues to present with activation tremors during mobility and requiring increased time for coordination. Pt's mother and grandmother present throughout. Continue to recommend dc to [facility] and will continue to follow acutely as admitted.    Recommendations for follow up therapy are one component of a multi-disciplinary discharge planning process, led by the attending physician.  Recommendations may be updated based on patient status, additional functional criteria and insurance authorization.    Follow Up Recommendations  Acute inpatient rehab (3hours/day)    Assistance Recommended at Discharge Frequent or constant Supervision/Assistance  Patient can return home with the following  Two people to help with walking and/or transfers;Two people to help with bathing/dressing/bathroom;Assistance with cooking/housework;Assist for transportation;Help with stairs or ramp for entrance   Equipment Recommendations  Other (comment) (Defer)    Recommendations for Other Services Rehab consult    Precautions / Restrictions Precautions Precautions: Fall Required Braces or Orthoses: Splint/Cast Splint/Cast: RLE Splint/Cast - Date Prophylactic Dressing Applied (if applicable): 37/48/27 Restrictions Weight Bearing Restrictions: Yes RLE Weight Bearing: Non weight bearing       Mobility Bed Mobility Overal bed mobility: Needs Assistance Bed  Mobility: Supine to Sit, Sit to Supine     Supine to sit: Min guard Sit to supine: Min assist   General bed mobility comments: assist to guide R LE back into bed    Transfers Overall transfer level: Needs assistance Equipment used: Rolling walker (2 wheels) Transfers: Sit to/from Stand, Bed to chair/wheelchair/BSC Sit to Stand: Mod assist, +2 physical assistance, +2 safety/equipment          Lateral/Scoot Transfers: Min assist, +2 physical assistance, +2 safety/equipment General transfer comment: able to stand x 2 from EOB with modA+2, good recall of hand placement. Requires increased time to get hands placed and reduce tremors. Able to stand upright with cues. Worked on lateral scoot on EOB with patient able to complete with minA+2. Educated on hand placement and head hips relationship     Balance Overall balance assessment: Needs assistance Sitting-balance support: Feet supported Sitting balance-Leahy Scale: Good     Standing balance support: Bilateral upper extremity supported Standing balance-Leahy Scale: Poor                             ADL either performed or assessed with clinical judgement   ADL Overall ADL's : Needs assistance/impaired                 Upper Body Dressing : Minimal assistance;Sitting Upper Body Dressing Details (indicate cue type and reason): donning new gown         Toileting- Clothing Manipulation and Hygiene: Maximal assistance;Sit to/from stand Toileting - Clothing Manipulation Details (indicate cue type and reason): +2 for maintaining standing with third person (pt's mother) providing Max A for peri care.     Functional mobility during ADLs: Moderate assistance;+2 for physical assistance (sit<>Stand) General ADL Comments:  Pt performing sit<>stand twice to change sheet and then perform peri care. Pt very motivated    Extremity/Trunk Assessment Upper Extremity Assessment Upper Extremity Assessment: RUE  deficits/detail;LUE deficits/detail RUE Deficits / Details: generalized weakness and significant tremors LUE Deficits / Details: General weakness and tremors with movement.   Lower Extremity Assessment Lower Extremity Assessment: Defer to PT evaluation        Vision   Vision Assessment?: No apparent visual deficits   Perception     Praxis      Cognition Arousal/Alertness: Awake/alert Behavior During Therapy: Anxious Overall Cognitive Status: History of cognitive impairments - at baseline                                 General Comments: motivated to participate and move better. Significant tremors with mobility        Exercises      Shoulder Instructions       General Comments Mother and grandmother present throughout    Pertinent Vitals/ Pain       Pain Assessment Pain Assessment: Faces Faces Pain Scale: Hurts little more Pain Location: R LE Pain Descriptors / Indicators: Aching, Guarding, Throbbing Pain Intervention(s): Monitored during session, Limited activity within patient's tolerance, Repositioned  Home Living                                          Prior Functioning/Environment              Frequency  Min 2X/week        Progress Toward Goals  OT Goals(current goals can now be found in the care plan section)  Progress towards OT goals: Progressing toward goals  Acute Rehab OT Goals OT Goal Formulation: With patient Time For Goal Achievement: 05/04/22 Potential to Achieve Goals: Good ADL Goals Pt Will Perform Grooming: with supervision;standing Pt Will Perform Lower Body Bathing: with supervision;sit to/from stand Pt Will Perform Lower Body Dressing: with supervision;sit to/from stand Pt Will Transfer to Toilet: with min assist;stand pivot transfer;bedside commode Pt Will Perform Toileting - Clothing Manipulation and hygiene: with min assist;sit to/from stand  Plan Discharge plan remains appropriate     Co-evaluation    PT/OT/SLP Co-Evaluation/Treatment: Yes Reason for Co-Treatment: For patient/therapist safety;To address functional/ADL transfers   OT goals addressed during session: ADL's and self-care      AM-PAC OT "6 Clicks" Daily Activity     Outcome Measure   Help from another person eating meals?: None Help from another person taking care of personal grooming?: A Little Help from another person toileting, which includes using toliet, bedpan, or urinal?: A Lot Help from another person bathing (including washing, rinsing, drying)?: A Lot Help from another person to put on and taking off regular upper body clothing?: A Little Help from another person to put on and taking off regular lower body clothing?: A Lot 6 Click Score: 16    End of Session Equipment Utilized During Treatment: Gait belt;Rolling walker (2 wheels)  OT Visit Diagnosis: Unsteadiness on feet (R26.81)   Activity Tolerance Patient limited by fatigue   Patient Left in bed;with call bell/phone within reach;with bed alarm set   Nurse Communication Mobility status;Need for lift equipment;Weight bearing status        Time: 1539-1610 OT Time Calculation (min): 31 min  Charges: OT General Charges $  OT Visit: 1 Visit OT Treatments $Self Care/Home Management : 8-22 mins  Sahvanna Mcmanigal MSOT, OTR/L Acute Rehab Office: [phone]  Theodoro Grist Larosa Rhines 04/21/2022, 4:54 PM

## 2022-04-21 NOTE — Progress Notes (Signed)
Physical Therapy Treatment Patient Details Name: Shannon Travis MRN: [id number] DOB: 1985/12/10 Today's Date: 04/21/2022   History of Present Illness 36 y/o female admitted on 04/19/22 following R ankle ORIF after fall x 1 week ago after seizure. PMH: seizures, CP, pre-diabetes    PT Comments    Patient progressing towards physical therapy goals. Patient requested to stand this session with ability to stand x 2 with modA+2 and RW. In standing, patient very tremulous making it difficult to progress beyond static standing. Attempted lateral scooting on EOB to problem solve safe technique to achieve OOB. Able to lateral scoot to R with minA+2. Educated patient on technique and head hips relationship, patient verbalized understanding. Continue to recommend SNF for ongoing Physical Therapy as mother is only caregiver at home and unable to manage patient at this level of function.    Recommendations for follow up therapy are one component of a multi-disciplinary discharge planning process, led by the attending physician.  Recommendations may be updated based on patient status, additional functional criteria and insurance authorization.  Follow Up Recommendations  Skilled nursing-short term rehab (<3 hours/day) Can patient physically be transported by private vehicle: No   Assistance Recommended at Discharge Frequent or constant Supervision/Assistance  Patient can return home with the following Two people to help with walking and/or transfers;A lot of help with bathing/dressing/bathroom;Assistance with cooking/housework;Assist for transportation;Help with stairs or ramp for entrance   Equipment Recommendations  Rolling Craigory Toste (2 wheels);Wheelchair (measurements PT);Wheelchair cushion (measurements PT);Hospital bed    Recommendations for Other Services       Precautions / Restrictions Precautions Precautions: Fall Required Braces or Orthoses: Splint/Cast Splint/Cast: RLE Splint/Cast - Date  Prophylactic Dressing Applied (if applicable): [id number] Restrictions Weight Bearing Restrictions: Yes RLE Weight Bearing: Non weight bearing     Mobility  Bed Mobility Overal bed mobility: Needs Assistance Bed Mobility: Supine to Sit, Sit to Supine     Supine to sit: Min guard Sit to supine: Min assist   General bed mobility comments: assist to guide R LE back into bed    Transfers Overall transfer level: Needs assistance Equipment used: Rolling Lajuan Kovaleski (2 wheels) Transfers: Sit to/from Stand, Bed to chair/wheelchair/BSC Sit to Stand: Mod assist, +2 physical assistance, +2 safety/equipment          Lateral/Scoot Transfers: Min assist, +2 physical assistance, +2 safety/equipment General transfer comment: able to stand x 2 from EOB with modA+2, good recall of hand placement. Requires increased time to get hands placed and reduce tremors. Able to stand upright with cues. Worked on lateral scoot on EOB with patient able to complete with minA+2. Educated on hand placement and head hips relationship    Ambulation/Gait                   Marine scientist Rankin (Stroke Patients Only)       Balance Overall balance assessment: Needs assistance Sitting-balance support: Feet supported Sitting balance-Leahy Scale: Good     Standing balance support: Bilateral upper extremity supported Standing balance-Leahy Scale: Poor                              Cognition Arousal/Alertness: Awake/alert Behavior During Therapy: Anxious Overall Cognitive Status: History of cognitive impairments - at baseline  General Comments: motivated to participate and move better. Significant tremors with mobility        Exercises      General Comments        Pertinent Vitals/Pain Pain Assessment Pain Assessment: Faces Faces Pain Scale: Hurts little more Pain Location: R LE Pain  Descriptors / Indicators: Aching, Guarding, Throbbing Pain Intervention(s): Monitored during session    Home Living                          Prior Function            PT Goals (current goals can now be found in the care plan section) Acute Rehab PT Goals PT Goal Formulation: With patient Time For Goal Achievement: 05/04/22 Potential to Achieve Goals: Good Progress towards PT goals: Progressing toward goals    Frequency    Min 3X/week      PT Plan Current plan remains appropriate    Co-evaluation              AM-PAC PT "6 Clicks" Mobility   Outcome Measure  Help needed turning from your back to your side while in a flat bed without using bedrails?: A Little Help needed moving from lying on your back to sitting on the side of a flat bed without using bedrails?: A Little Help needed moving to and from a bed to a chair (including a wheelchair)?: Total Help needed standing up from a chair using your arms (e.g., wheelchair or bedside chair)?: Total Help needed to walk in hospital room?: Total Help needed climbing 3-5 steps with a railing? : Total 6 Click Score: 10    End of Session Equipment Utilized During Treatment: Gait belt Activity Tolerance: Patient tolerated treatment well Patient left: in bed;with call bell/phone within reach;with family/visitor present Nurse Communication: Mobility status PT Visit Diagnosis: Unsteadiness on feet (R26.81);Muscle weakness (generalized) (M62.81);History of falling (Z91.81);Other abnormalities of gait and mobility (R26.89)     Time: 1540-1611 PT Time Calculation (min) (ACUTE ONLY): 31 min  Charges:  $Therapeutic Activity: 8-22 mins                     Emony Dormer A. Gilford Rile PT, DPT Acute Rehabilitation Services Office [phone]    Linna Hoff 04/21/2022, 4:46 PM

## 2022-04-22 MED ORDER — DIPHENHYDRAMINE HCL 25 MG PO CAPS
25.0000 mg | ORAL_CAPSULE | Freq: Four times a day (QID) | ORAL | Status: DC | PRN
  Administered 2022-04-22: 25 mg via ORAL
  Filled 2022-04-22: qty 1

## 2022-04-22 NOTE — Progress Notes (Signed)
Subjective: 3 Days Post-Op Procedure(s) (LRB): OPEN REDUCTION INTERNAL FIXATION (ORIF) right ANKLE FRACTURE, lateral malleolus (Right) Patient reports pain as moderate.    Objective: Vital signs in last 24 hours: Temp:  [98.2 F (36.8 C)-98.6 F (37 C)] 98.2 F (36.8 C) (10/08 0820) Pulse Rate:  [75-92] 91 (10/08 0820) Resp:  [17-18] 18 (10/08 0820) BP: (94-135)/(68-89) 94/81 (10/08 0820) SpO2:  [97 %-100 %] 100 % (10/08 0820)  Intake/Output from previous day: 10/07 0701 - 10/08 0700 In: 2000 [I.V.:2000] Out: 2050 [Urine:2050] Intake/Output this shift: No intake/output data recorded.  Recent Labs    04/19/22 1810  HGB 14.0   Recent Labs    04/19/22 1810  WBC 6.9  RBC 4.56  HCT 41.8  PLT 323   Recent Labs    04/19/22 1810  CREATININE 0.82   No results for input(s): "LABPT", "INR" in the last 72 hours.  Neurologically intact ABD soft Neurovascular intact Sensation intact distally Intact pulses distally Dorsiflexion/Plantar flexion intact Incision: dressing C/D/I and no drainage No cellulitis present Compartment soft No sign of DVT   Assessment/Plan: 3 Days Post-Op Procedure(s) (LRB): OPEN REDUCTION INTERNAL FIXATION (ORIF) right ANKLE FRACTURE, lateral malleolus (Right) Advance diet Up with therapy D/C IV fluids NWB RLE Lovenox in house for DVT ppx convert to Xarelto on D/C Therapy recommending SNF - awaiting placement   Cecilie Kicks 04/22/2022, 9:18 AM

## 2022-04-22 NOTE — Plan of Care (Signed)

## 2022-04-22 NOTE — TOC Initial Note (Signed)
Transition of Care [facility]) - Initial/Assessment Note    Patient Details  Name: Shannon Travis MRN: [id number] Date of Birth: [birth year]  [facility] CM/SW Contact:    Tresa Endo Phone Number: 04/22/2022, 11:49 AM  Clinical Narrative:                 CSW received SNF consult. CSW met with pt at bedside. CSW introduced self and explained role at the hospital. Pt reports that PTA the pt lived at home with her mother. PT reports pt is minA+2/modA+2 and will benefit from continued PT at DC.  CSW reviewed PT/OT recommendations for SNF. Pt reports being agreeable to SNF. Pt gave CSW permission to fax out to facilities in the area. Pt has no preference of facility at this time.   CSW will continue to follow.    Expected Discharge Plan: Home/Self Care Barriers to Discharge: No Barriers Identified   Patient Goals and CMS Choice Patient states their goals for this hospitalization and ongoing recovery are:: to return to home CMS Medicare.gov Compare Post Acute Care list provided to:: Patient Choice offered to / list presented to : Patient  Expected Discharge Plan and Services Expected Discharge Plan: Home/Self Care   Discharge Planning Services: CM Consult Post Acute Care Choice: Durable Medical Equipment Living arrangements for the past 2 months: Single Family Home                 DME Arranged: Walker rolling DME Agency: [employer] Date DME Agency Contacted: 04/20/22 Time DME Agency Contacted: 53 Representative spoke with at DME Agency: [employer]: NA          Prior Living Arrangements/Services Living arrangements for the past 2 months: Single Family Home Lives with:: Significant Other Patient language and need for interpreter reviewed:: Yes Do you feel safe going back to the place where you live?: Yes      Need for Family Participation in Patient Care: Yes (Comment) Care giver support system in place?: Yes (comment)   Criminal  Activity/Legal Involvement Pertinent to Current Situation/Hospitalization: No - Comment as needed  Activities of Daily Living Home Assistive Devices/Equipment: None ADL Screening (condition at time of admission) Patient's cognitive ability adequate to safely complete daily activities?: No Is the patient deaf or have difficulty hearing?: Yes Does the patient have difficulty seeing, even when wearing glasses/contacts?: No Does the patient have difficulty concentrating, remembering, or making decisions?: No Patient able to express need for assistance with ADLs?: No Does the patient have difficulty dressing or bathing?: No Independently performs ADLs?: Yes (appropriate for developmental age) Does the patient have difficulty walking or climbing stairs?: No Weakness of Legs: Right Weakness of Arms/Hands: None  Permission Sought/Granted   Permission granted to share information with : No              Emotional Assessment Appearance:: Appears stated age Attitude/Demeanor/Rapport: Engaged Affect (typically observed): Accepting Orientation: : Oriented to Self, Oriented to Place, Oriented to  Time, Oriented to Situation Alcohol / Substance Use: Not Applicable Psych Involvement: No (comment)  Admission diagnosis:  Fibula fracture [S82.409A] Patient Active Problem List   Diagnosis Date Noted   Fibula fracture 04/19/2022   B12 deficiency 01/18/2021   Weakness of both lower extremities 01/18/2021   Vitamin D deficiency 09/30/2020   Encounter for surveillance of contraceptive pills 12/17/2019   Mixed hyperlipidemia 12/17/2019   Family history of blood clots 11/25/2019   Osteopenia of multiple sites 11/24/2019   Arthralgia  of right hip 11/16/2019   DDD (degenerative disc disease), lumbar 11/16/2019   Partial epilepsy with impairment of consciousness ([place]) 12/03/2012   Abnormality of gait 12/03/2012   Essential and other specified forms of tremor 12/03/2012   H/O mental disorder  05/15/2011   GAD (generalized anxiety disorder) 05/15/2011   Petit-mal epilepsy ([place]) 05/15/2011   Cerebral palsy ([place]) 05/02/2011   Benign essential tremor 05/02/2011   Bilateral polycystic ovarian syndrome 02/28/2011   PCP:  [facility] [place]:   [id number] [place] [place]Phone: [phone]: [phone]    Social Determinants of Health (SDOH) Interventions    Readmission Risk Interventions     No data to display

## 2022-04-22 NOTE — [place] FL2 (Signed)
[place] MEDICAID FL2 LEVEL OF CARE SCREENING TOOL     IDENTIFICATION  Patient Name: Shannon Travis Birthdate: [birth year] Sex: female Admission Date (Current Location): 04/19/2022  [facility] and [place] Number:  Producer, television/film/video and Address:  [facility], [address], [place], [place] [address]      Provider Number: [id number]  Attending Physician Name and Address:  Toni Arthurs, MD  Relative Name and Phone Number:  Hollie Beach (Mother) [phone]    Current Level of Care: Hospital Recommended Level of Care: Skilled Nursing Facility Prior Approval Number:    Date Approved/Denied:   PASRR Number:    Discharge Plan: SNF    Current Diagnoses: Patient Active Problem List   Diagnosis Date Noted   Fibula fracture 04/19/2022   B12 deficiency 01/18/2021   Weakness of both lower extremities 01/18/2021   Vitamin D deficiency 09/30/2020   Encounter for surveillance of contraceptive pills 12/17/2019   Mixed hyperlipidemia 12/17/2019   Family history of blood clots 11/25/2019   Osteopenia of multiple sites 11/24/2019   Arthralgia of right hip 11/16/2019   DDD (degenerative disc disease), lumbar 11/16/2019   Partial epilepsy with impairment of consciousness (HCC) 12/03/2012   Abnormality of gait 12/03/2012   Essential and other specified forms of tremor 12/03/2012   H/O mental disorder 05/15/2011   GAD (generalized anxiety disorder) 05/15/2011   Petit-mal epilepsy (HCC) 05/15/2011   Cerebral palsy (HCC) 05/02/2011   Benign essential tremor 05/02/2011   Bilateral polycystic ovarian syndrome 02/28/2011    Orientation RESPIRATION BLADDER Height & Weight     Self, Time, Situation, Place  Normal Continent, External catheter Weight: (!) 305 lb (138.3 kg) Height:  5\' 7"  (170.2 cm)  BEHAVIORAL SYMPTOMS/MOOD NEUROLOGICAL BOWEL NUTRITION STATUS      Continent Diet (See DC summary)  AMBULATORY STATUS COMMUNICATION OF NEEDS Skin   Extensive  Assist Verbally Normal                       Personal Care Assistance Level of Assistance  Bathing, Feeding, Dressing Bathing Assistance: Maximum assistance Feeding assistance: Independent Dressing Assistance: Maximum assistance     Functional Limitations Info  Sight, Hearing, Speech Sight Info: Adequate Hearing Info: Adequate Speech Info: Adequate    SPECIAL CARE FACTORS FREQUENCY  OT (By licensed OT), PT (By licensed PT)     PT Frequency: 5x a week OT Frequency: 5x a week            Contractures Contractures Info: Not present    Additional Factors Info  Code Status, Allergies Code Status Info: Full Allergies Info: NKA           Current Medications (04/22/2022):  This is the current hospital active medication list Current Facility-Administered Medications  Medication Dose Route Frequency Provider Last Rate Last Admin   0.9 %  sodium chloride infusion   Intravenous Continuous 06/22/2022, MD 50 mL/hr at 04/22/22 0404 New Bag at 04/22/22 0404   acetaminophen (TYLENOL) tablet 325-650 mg  325-650 mg Oral Q6H PRN 06/22/22, MD   650 mg at 04/20/22 1020   bisacodyl (DULCOLAX) suppository 10 mg  10 mg Rectal Daily PRN 06/20/22, MD       docusate sodium (COLACE) capsule 100 mg  100 mg Oral BID Toni Arthurs, MD   100 mg at 04/22/22 0908   enoxaparin (LOVENOX) injection 40 mg  40 mg Subcutaneous Q24H 06/22/22, MD   40 mg at 04/22/22  1478   FLUoxetine (PROZAC) capsule 40 mg  40 mg Oral Daily Wylene Simmer, MD   40 mg at 04/22/22 0908   HYDROcodone-acetaminophen (NORCO) 7.5-325 MG per tablet 1-2 tablet  1-2 tablet Oral Q4H PRN Wylene Simmer, MD   2 tablet at 04/22/22 0401   HYDROcodone-acetaminophen (NORCO/VICODIN) 5-325 MG per tablet 1-2 tablet  1-2 tablet Oral Q4H PRN Wylene Simmer, MD   2 tablet at 04/22/22 0908   levETIRAcetam (KEPPRA) tablet 1,000 mg  1,000 mg Oral BID Wylene Simmer, MD   1,000 mg at 04/22/22 0908   magnesium citrate solution 1 Bottle  1 Bottle  Oral Once PRN Wylene Simmer, MD       morphine (PF) 2 MG/ML injection 0.5-1 mg  0.5-1 mg Intravenous Q2H PRN Wylene Simmer, MD   1 mg at 04/20/22 2213   norethindrone (MICRONOR) 0.35 MG tablet 0.35 mg  1 tablet Oral Daily Wylene Simmer, MD   0.35 mg at 04/22/22 0913   ondansetron (ZOFRAN) tablet 4 mg  4 mg Oral Q6H PRN Wylene Simmer, MD       Or   ondansetron [facility]) injection 4 mg  4 mg Intravenous Q6H PRN Wylene Simmer, MD       polyethylene glycol (MIRALAX / GLYCOLAX) packet 17 g  17 g Oral Daily PRN Wylene Simmer, MD       senna Donavan Burnet) tablet 8.6 mg  1 tablet Oral BID Wylene Simmer, MD   8.6 mg at 04/22/22 0908   topiramate (TOPAMAX) tablet 100 mg  100 mg Oral Daily Wylene Simmer, MD   100 mg at 04/22/22 0908     Discharge Medications: Please see discharge summary for a list of discharge medications.  Relevant Imaging Results:  Relevant Lab Results:   Additional Information SSN: [id number]  Reece Agar, [place]

## 2022-04-23 NOTE — Plan of Care (Signed)
  Problem: Education: Goal: Knowledge of General Education information will improve Description: Including pain rating scale, medication(s)/side effects and non-pharmacologic comfort measures Outcome: Progressing   Problem: Health Behavior/Discharge Planning: Goal: Ability to manage health-related needs will improve Outcome: Progressing   Problem: Nutrition: Goal: Adequate nutrition will be maintained Outcome: Progressing   Problem: Coping: Goal: Level of anxiety will decrease Outcome: Progressing   Problem: Pain Managment: Goal: General experience of comfort will improve Outcome: Progressing   Problem: Elimination: Goal: Will not experience complications related to bowel motility Outcome: Progressing Goal: Will not experience complications related to urinary retention Outcome: Progressing   Problem: Safety: Goal: Ability to remain free from injury will improve Outcome: Progressing   Problem: Skin Integrity: Goal: Risk for impaired skin integrity will decrease Outcome: Progressing

## 2022-04-23 NOTE — Progress Notes (Signed)
Physical Therapy Treatment Patient Details Name: Shannon Travis MRN: [id number] DOB: Jun 04, 1986 Today's Date: 04/23/2022   History of Present Illness 36 y/o female admitted on 04/19/22 following R ankle ORIF after fall x 1 week ago after seizure. PMH: seizures, CP, pre-diabetes    PT Comments    Pt progressing slowly towards her physical therapy goals. Session focused on transfer training and toileting. Pt requiring two person moderate assist for sit to stands using walker. Unable to pivot due to increased tremulousness, so bedside commode brought up behind pt. Would benefit from trialing slide board transfers. Continue to recommend SNF for ongoing Physical Therapy.      Recommendations for follow up therapy are one component of a multi-disciplinary discharge planning process, led by the attending physician.  Recommendations may be updated based on patient status, additional functional criteria and insurance authorization.  Follow Up Recommendations  Skilled nursing-short term rehab (<3 hours/day) Can patient physically be transported by private vehicle: No   Assistance Recommended at Discharge Frequent or constant Supervision/Assistance  Patient can return home with the following Two people to help with walking and/or transfers;A lot of help with bathing/dressing/bathroom;Assistance with cooking/housework;Assist for transportation;Help with stairs or ramp for entrance   Equipment Recommendations  Rolling walker (2 wheels);Wheelchair (measurements PT);Wheelchair cushion (measurements PT);BSC/3in1    Recommendations for Other Services       Precautions / Restrictions Precautions Precautions: Fall Restrictions Weight Bearing Restrictions: Yes RLE Weight Bearing: Non weight bearing     Mobility  Bed Mobility Overal bed mobility: Needs Assistance Bed Mobility: Supine to Sit     Supine to sit: Min guard     General bed mobility comments: Min guard for safety, no physical  assist required    Transfers Overall transfer level: Needs assistance Equipment used: Rolling walker (2 wheels) Transfers: Sit to/from Stand Sit to Stand: Mod assist, +2 physical assistance           General transfer comment: ModA + 2 to stand from edge of bed and BSC. Cues for hand placement and heavy assist to power up to stand. Increased tremulousness in standing. BSC brought up behind pt and then subsequently recliner. Unable to pivot    Ambulation/Gait                   Stairs             Wheelchair Mobility    Modified Rankin (Stroke Patients Only)       Balance Overall balance assessment: Needs assistance Sitting-balance support: Feet supported Sitting balance-Leahy Scale: Good     Standing balance support: Bilateral upper extremity supported Standing balance-Leahy Scale: Poor                              Cognition Arousal/Alertness: Awake/alert Behavior During Therapy: Anxious Overall Cognitive Status: History of cognitive impairments - at baseline                                 General Comments: Decreased interactiveness/engagement with PT, but overall agreeable to participate        Exercises      General Comments        Pertinent Vitals/Pain Pain Assessment Pain Assessment: Faces Faces Pain Scale: Hurts a little bit Pain Location: R LE Pain Descriptors / Indicators: Guarding Pain Intervention(s): Monitored during session    Home Living  Prior Function            PT Goals (current goals can now be found in the care plan section) Acute Rehab PT Goals Patient Stated Goal: to be able to stand Potential to Achieve Goals: Good Progress towards PT goals: Progressing toward goals    Frequency    Min 3X/week      PT Plan Current plan remains appropriate    Co-evaluation              AM-PAC PT "6 Clicks" Mobility   Outcome Measure  Help needed  turning from your back to your side while in a flat bed without using bedrails?: None Help needed moving from lying on your back to sitting on the side of a flat bed without using bedrails?: A Little Help needed moving to and from a bed to a chair (including a wheelchair)?: Total Help needed standing up from a chair using your arms (e.g., wheelchair or bedside chair)?: Total Help needed to walk in hospital room?: Total Help needed climbing 3-5 steps with a railing? : Total 6 Click Score: 11    End of Session Equipment Utilized During Treatment: Gait belt Activity Tolerance: Patient tolerated treatment well Patient left: in chair;with call bell/phone within reach;with chair alarm set Nurse Communication: Mobility status PT Visit Diagnosis: Unsteadiness on feet (R26.81);Muscle weakness (generalized) (M62.81);History of falling (Z91.81);Other abnormalities of gait and mobility (R26.89)     Time: 2440-1027 PT Time Calculation (min) (ACUTE ONLY): 27 min  Charges:  $Therapeutic Activity: 23-37 mins                     Lillia Pauls, PT, DPT Acute Rehabilitation Services Office [phone]    Norval Morton 04/23/2022, 1:25 PM

## 2022-04-23 NOTE — TOC Progression Note (Signed)
Transition of Care [facility]) - Progression Note    Patient Details  Name: Shannon Travis MRN: [id number] Date of Birth: 09/24/1985  Transition of Care [facility]) CM/SW Contact  Reece Agar, [place] Phone Number: 04/23/2022, 4:17 PM  Clinical Narrative:    Pt has no bed offers, CSW will continue to follow for DP.   Expected Discharge Plan: Home/Self Care Barriers to Discharge: No Barriers Identified  Expected Discharge Plan and Services Expected Discharge Plan: Home/Self Care   Discharge Planning Services: CM Consult Post Acute Care Choice: Durable Medical Equipment Living arrangements for the past 2 months: Single Family Home                 DME Arranged: Walker rolling DME Agency: [employer] Date DME Agency Contacted: 04/20/22 Time DME Agency Contacted: 44 Representative spoke with at DME Agency: [facility] HH Arranged: NA           Social Determinants of Health ([place]) Interventions    Readmission Risk Interventions     No data to display

## 2022-04-23 NOTE — Discharge Summary (Signed)
Physician Discharge Summary  Patient ID: Shannon Travis MRN: [id number] DOB/AGE: 1986-04-17 36 y.o.  Admit date: 04/19/2022 Discharge date: 05/04/2022  Admission Diagnoses: Right fibula fx; parital epilepsy with impairment of consciousness, gait abnormality, CP, H/O mental disorder, bilateral polycystic ovarian syndrome, B12 deficiency, DDD lumbar spine, GAD, hyperlipidemia, osteopenia, Vit D definiciency.  Discharge Diagnoses:  Principal Problem:   Fibula fracture Same as above  Discharged Condition: stable  Hospital Course: Patient presented to [facility] or 04/19/22 for ORIF Right lateral malleolus fracture by Dr. Wylene Simmer.  She tolerated the procedure well without complication.  The patient was admitted to [facility] to work with therapy and for D/C planning as she was unable to safely maintain a NWB status prior to surgery.  She worked well with therapy.  She tolerated her stay well without complication.  She is to be D/C'd to SNF.  Consults:  PT/OT/Case management  Significant Diagnostic Studies: radiology: X-Ray: To ensure satisfactory anatomic alignment during operative procedure.  Treatments: IV hydration, antibiotics: Ancef, analgesia: acetaminophen, Vicodin, and Morphine, anticoagulation: lovenox, and surgery: as stated above.  Discharge Exam: Blood pressure 129/75, pulse 79, temperature (!) 97.4 F (36.3 C), temperature source Oral, resp. rate 16, height 5\' 7"  (1.702 m), weight (!) 138.3 kg, SpO2 100 %. General: Overweight patient in NAD. Psych:  Appropriate mood and affect. Neuro:  A&O x 3, Moving all extremities, sensation intact to light touch HEENT:  EOMs intact Chest:  Even non-labored respirations Skin:  SLS C/D/I, no rashes or lesions Extremities: warm/dry, no visible edema, erythema or echymosis.  No lymphadenopathy. Pulses: Popliteus 2+ MSK:  ROM: EHL/FHL intact, MMT: able to perform quad set  Disposition: Discharge  disposition: 03-Skilled [place]       Discharge Instructions     Call MD / Call 911   Complete by: As directed    If you experience chest pain or shortness of breath, CALL 911 and be transported to the hospital emergency room.  If you develope a fever above 101 F, pus (white drainage) or increased drainage or redness at the wound, or calf pain, call your surgeon's office.   Constipation Prevention   Complete by: As directed    Drink plenty of fluids.  Prune juice may be helpful.  You may use a stool softener, such as Colace (over the counter) 100 mg twice a day.  Use MiraLax (over the counter) for constipation as needed.   Diet - low sodium heart healthy   Complete by: As directed    Increase activity slowly as tolerated   Complete by: As directed    Non weight bearing   Complete by: As directed    Laterality: right   Extremity: Lower   Post-operative opioid taper instructions:   Complete by: As directed    POST-OPERATIVE OPIOID TAPER INSTRUCTIONS: It is important to wean off of your opioid medication as soon as possible. If you do not need pain medication after your surgery it is ok to stop day one. Opioids include: Codeine, Hydrocodone(Norco, Vicodin), Oxycodone(Percocet, oxycontin) and hydromorphone amongst others.  Long term and even short term use of opiods can cause: Increased pain response Dependence Constipation Depression Respiratory depression And more.  Withdrawal symptoms can include Flu like symptoms Nausea, vomiting And more Techniques to manage these symptoms Hydrate well Eat regular healthy meals Stay active Use relaxation techniques(deep breathing, meditating, yoga) Do Not substitute Alcohol to help with tapering If you have been on opioids for less  than two weeks and do not have pain than it is ok to stop all together.  Plan to wean off of opioids This plan should start within one week post op of your joint replacement. Maintain the same  interval or time between taking each dose and first decrease the dose.  Cut the total daily intake of opioids by one tablet each day Next start to increase the time between doses. The last dose that should be eliminated is the evening dose.         Allergies as of 05/04/2022   No Known Allergies      Medication List     STOP taking these medications    oxyCODONE 5 MG immediate release tablet Commonly known as: Roxicodone       TAKE these medications    FLUoxetine 40 MG capsule Commonly known as: PROZAC Take 1 capsule (40 mg total) by mouth daily. What changed: when to take this   levETIRAcetam 1000 MG tablet Commonly known as: KEPPRA TAKE 1 TABLET BY MOUTH TWICE A DAY   norethindrone 0.35 MG tablet Commonly known as: MICRONOR Take 1 tablet by mouth in the morning.   oyster calcium 500 MG Tabs tablet Take 500 mg of elemental calcium by mouth daily.   rivaroxaban 10 MG Tabs tablet Commonly known as: Xarelto Take 1 tablet (10 mg total) by mouth daily for 14 days.   topiramate 25 MG tablet Commonly known as: TOPAMAX Take 100 mg by mouth in the morning.   VITAMIN D3 GUMMIES ADULT PO Take 2 tablets by mouth every Sunday.       ASK your doctor about these medications    oxyCODONE 5 MG immediate release tablet Commonly known as: Roxicodone Take 1 tablet (5 mg total) by mouth every 6 (six) hours as needed for up to 5 days for severe pain. Ask about: Should I take this medication?               Durable Medical Equipment  (From admission, onward)           Start     Ordered   05/01/22 1447  For home use only DME Other see comment  Once       Comments: 30 inch sliding board  Question:  Length of Need  Answer:  Lifetime   05/01/22 1448   05/01/22 1446  For home use only DME 3 n 1  Once       Comments: Drop arm   05/01/22 1448   05/01/22 1446  For home use only DME standard manual wheelchair with seat cushion  Once       Comments: Patient  suffers from OPEN REDUCTION INTERNAL FIXATION (ORIF) right ANKLE FRACTURE, lateral malleolus (Right)  which impairs their ability to perform daily activities like ambulating  in the home.  A cane  will not resolve issue with performing activities of daily living. A wheelchair will allow patient to safely perform daily activities. Patient can safely propel the wheelchair in the home or has a caregiver who can provide assistance. Length of need Lifetime. Accessories: elevating leg rests (ELRs), wheel locks, extensions and anti-tippers.  22 x 18 inch wheelchair with elevating leg rests   Seat and back cushions   05/01/22 1448              Discharge Care Instructions  (From admission, onward)           Start     Ordered   05/04/22  0000  Non weight bearing       Question Answer Comment  Laterality right   Extremity Lower      05/04/22 1140            Follow-up Information     Toni Arthurs, MD. Schedule an appointment as soon as possible for a visit in 4 week(s).   Specialty: Orthopedic Surgery Why: This will be for a 6 week post-op appointment. Contact information: [address] [place] [address] [phone]         [facility] Follow up.   Specialty: [facility] Contact information: [address] [place] [address] [place] [address] [phone]                 Signed: Lolly Mustache Office:  [phone]

## 2022-04-23 NOTE — Progress Notes (Signed)
Subjective: 4 Days Post-Op Procedure(s) (LRB): OPEN REDUCTION INTERNAL FIXATION (ORIF) right ANKLE FRACTURE, lateral malleolus (Right)  Patient reports pain as mild to moderate.  Resting comfortably in bed.  Denies fever, chills, N/V, CP, SOB.  Tolerating POs well.  Admits to flatus.  Objective:   VITALS:  Temp:  [97.8 F (36.6 C)-98.9 F (37.2 C)] 97.8 F (36.6 C) (10/09 0442) Pulse Rate:  [90-92] 91 (10/09 0442) Resp:  [17-18] 18 (10/08 2139) BP: (94-117)/(71-81) 100/81 (10/09 0442) SpO2:  [88 %-100 %] 100 % (10/09 0442)  General: Over weight patient in NAD. Psych:  Appropriate mood and affect. Neuro:  A&O x 3, Moving all extremities, sensation intact to light touch HEENT:  EOMs intact Chest:  Even non-labored respirations Skin: SLS C/D/I, no rashes or lesions Extremities: warm/dry, no visible edema, erythema or echymosis.  No lymphadenopathy. Pulses: Popliteus 2+ MSK:  ROM: EHL/FHL intact, MMT: able to perform quad set   LABS No results for input(s): "HGB", "WBC", "PLT" in the last 72 hours. No results for input(s): "NA", "K", "CL", "CO2", "BUN", "CREATININE", "GLUCOSE" in the last 72 hours. No results for input(s): "LABPT", "INR" in the last 72 hours.   Assessment/Plan: 4 Days Post-Op Procedure(s) (LRB): OPEN REDUCTION INTERNAL FIXATION (ORIF) right ANKLE FRACTURE, lateral malleolus (Right)  NWB R LE Up with therapy DVT ppx:  lovenox in house; transition to Xarelto upon D/C. Disp:  SNF, awaiting bed.  Mechele Claude PA-C [facility] Office:  [phone]

## 2022-04-24 NOTE — Progress Notes (Signed)
Subjective: 5 Days Post-Op Procedure(s) (LRB): OPEN REDUCTION INTERNAL FIXATION (ORIF) right ANKLE FRACTURE, lateral malleolus (Right)  Patient reports pain as mild to moderate.  Resting comfortably in bed.  Notes that she is working well with therapy.  Tolerating POs well. Admits to flatus.  Patient's mother listened in on speaker phone.  Objective:   VITALS:  Temp:  [97.7 F (36.5 C)-98.4 F (36.9 C)] 97.7 F (36.5 C) (10/10 0754) Pulse Rate:  [84-93] 84 (10/10 0754) Resp:  [16-17] 17 (10/10 0754) BP: (118-244)/(70-218) 136/70 (10/10 0754) SpO2:  [99 %-100 %] 100 % (10/10 0754)  General: Overweight patient in NAD. Psych:  Appropriate mood and affect. Neuro:  A&O x 3, Moving all extremities, sensation intact to light touch HEENT:  EOMs intact Chest:  Even non-labored respirations Skin:  SLS C/D/I, no rashes or lesions Extremities: warm/dry, no visible edema, erythema or echymosis.  No lymphadenopathy. Pulses: Popliteus 2+ MSK:  ROM: EHL/FHL intact, MMT: able to perform quad set   LABS No results for input(s): "HGB", "WBC", "PLT" in the last 72 hours. No results for input(s): "NA", "K", "CL", "CO2", "BUN", "CREATININE", "GLUCOSE" in the last 72 hours. No results for input(s): "LABPT", "INR" in the last 72 hours.   Assessment/Plan: 5 Days Post-Op Procedure(s) (LRB): OPEN REDUCTION INTERNAL FIXATION (ORIF) right ANKLE FRACTURE, lateral malleolus (Right)  NWB R LE Up with therapy Disp: SNF, awaiting placement DVT ppx:  Lovenox in house: transition to xarelto upon D/C Plan for 2 week outpatient post-op visit.  Mechele Claude PA-C [facility] Office:  [phone]

## 2022-04-24 NOTE — TOC Progression Note (Signed)
Transition of Care [facility]) - Initial/Assessment Note    Patient Details  Name: Shannon Travis MRN: [id number] Date of Birth: [birth year]  Transition of Care [facility]) CM/SW Contact:    Ralene Bathe, LCSWA Phone Number: 04/24/2022, 1:43 PM  Clinical Narrative:                 Patient does not have any SNF bed offers.    TOC will continue to follow.   Expected Discharge Plan: Home/Self Care Barriers to Discharge: No Barriers Identified   Patient Goals and CMS Choice Patient states their goals for this hospitalization and ongoing recovery are:: to return to home CMS Medicare.gov Compare Post Acute Care list provided to:: Patient Choice offered to / list presented to : Patient  Expected Discharge Plan and Services Expected Discharge Plan: Home/Self Care   Discharge Planning Services: CM Consult Post Acute Care Choice: Durable Medical Equipment Living arrangements for the past 2 months: Single Family Home                 DME Arranged: Walker rolling DME Agency: [employer] Date DME Agency Contacted: 04/20/22 Time DME Agency Contacted: 1018 Representative spoke with at DME Agency: [facility] HH Arranged: NA          Prior Living Arrangements/Services Living arrangements for the past 2 months: Single Family Home Lives with:: Significant Other Patient language and need for interpreter reviewed:: Yes Do you feel safe going back to the place where you live?: Yes      Need for Family Participation in Patient Care: Yes (Comment) Care giver support system in place?: Yes (comment)   Criminal Activity/Legal Involvement Pertinent to Current Situation/Hospitalization: No - Comment as needed  Activities of Daily Living Home Assistive Devices/Equipment: None ADL Screening (condition at time of admission) Patient's cognitive ability adequate to safely complete daily activities?: No Is the patient deaf or have difficulty hearing?: Yes Does the patient have difficulty seeing, even  when wearing glasses/contacts?: No Does the patient have difficulty concentrating, remembering, or making decisions?: No Patient able to express need for assistance with ADLs?: No Does the patient have difficulty dressing or bathing?: No Independently performs ADLs?: Yes (appropriate for developmental age) Does the patient have difficulty walking or climbing stairs?: No Weakness of Legs: Right Weakness of Arms/Hands: None  Permission Sought/Granted   Permission granted to share information with : No              Emotional Assessment Appearance:: Appears stated age Attitude/Demeanor/Rapport: Engaged Affect (typically observed): Accepting Orientation: : Oriented to Self, Oriented to Place, Oriented to  Time, Oriented to Situation Alcohol / Substance Use: Not Applicable Psych Involvement: No (comment)  Admission diagnosis:  Fibula fracture [S82.409A] Patient Active Problem List   Diagnosis Date Noted   Fibula fracture 04/19/2022   B12 deficiency 01/18/2021   Weakness of both lower extremities 01/18/2021   Vitamin D deficiency 09/30/2020   Encounter for surveillance of contraceptive pills 12/17/2019   Mixed hyperlipidemia 12/17/2019   Family history of blood clots 11/25/2019   Osteopenia of multiple sites 11/24/2019   Arthralgia of right hip 11/16/2019   DDD (degenerative disc disease), lumbar 11/16/2019   Partial epilepsy with impairment of consciousness (HCC) 12/03/2012   Abnormality of gait 12/03/2012   Essential and other specified forms of tremor 12/03/2012   H/O mental disorder 05/15/2011   GAD (generalized anxiety disorder) 05/15/2011   Petit-mal epilepsy (HCC) 05/15/2011   Cerebral palsy (HCC) 05/02/2011   Benign essential tremor 05/02/2011  Bilateral polycystic ovarian syndrome 02/28/2011   PCP:  [facility] [place]:   [id number] [address] [name]  [id number] Phone: [phone] Fax: [phone]     Social Determinants of Health (SDOH) Interventions    Readmission Risk Interventions     No data to display

## 2022-04-25 NOTE — Progress Notes (Signed)
Physical Therapy Treatment Patient Details Name: Shannon Travis MRN: [id number] DOB: Jan 07, 1986 Today's Date: 04/25/2022   History of Present Illness 36 y/o female admitted on 04/19/22 following R ankle ORIF after fall x 1 week ago after seizure. PMH: seizures, CP, pre-diabetes    PT Comments    Pt progressing towards her physical therapy goals and remains motivated to participate. Session focused on trialing slide board transfer from bed to chair. Pt requiring up to two person moderate assist for lateral scoot transfers. Suspect she will be able to progress well with increased practice. Pt demonstrates less tremors in seated vs standing position, thus improving safety and ease of transfer.     Recommendations for follow up therapy are one component of a multi-disciplinary discharge planning process, led by the attending physician.  Recommendations may be updated based on patient status, additional functional criteria and insurance authorization.  Follow Up Recommendations  Skilled nursing-short term rehab (<3 hours/day) Can patient physically be transported by private vehicle: No   Assistance Recommended at Discharge Frequent or constant Supervision/Assistance  Patient can return home with the following Two people to help with walking and/or transfers;A lot of help with bathing/dressing/bathroom;Assistance with cooking/housework;Assist for transportation;Help with stairs or ramp for entrance   Equipment Recommendations  Wheelchair (measurements PT);Wheelchair cushion (measurements PT);BSC/3in1;Other (comment) (slide board, drop arm BSC)    Recommendations for Other Services       Precautions / Restrictions Precautions Precautions: Fall Restrictions Weight Bearing Restrictions: Yes RLE Weight Bearing: Non weight bearing     Mobility  Bed Mobility Overal bed mobility: Needs Assistance Bed Mobility: Supine to Sit     Supine to sit: Min guard     General bed mobility  comments: Min guard for safety, no physical assist required    Transfers Overall transfer level: Needs assistance Equipment used: Sliding board Transfers: Bed to chair/wheelchair/BSC            Lateral/Scoot Transfers: Mod assist, +2 physical assistance, With slide board General transfer comment: Pt requiring totalA for set up assist, cues for lateral lean to place slide board. Pt completing first two scoots with close min guard assist. To bridge gap and progress fully to chair, required up to modA + 2. Cues for technique provided    Ambulation/Gait                   Stairs             Wheelchair Mobility    Modified Rankin (Stroke Patients Only)       Balance Overall balance assessment: Needs assistance Sitting-balance support: Feet supported Sitting balance-Leahy Scale: Good                                      Cognition Arousal/Alertness: Awake/alert Behavior During Therapy: Anxious Overall Cognitive Status: History of cognitive impairments - at baseline                                 General Comments: Pleasant and encouraged by progress        Exercises General Exercises - Lower Extremity Long Arc Quad: Both, 10 reps, Seated (resistance on L, emphasis on hitting target for coordination/control) Hip ABduction/ADduction: Both, 10 reps, Supine Hip Flexion/Marching: Both, 10 reps, Seated    General Comments        Pertinent  Vitals/Pain Pain Assessment Pain Assessment: Faces Faces Pain Scale: Hurts a little bit Pain Location: R LE Pain Descriptors / Indicators: Guarding Pain Intervention(s): Monitored during session    Home Living                          Prior Function            PT Goals (current goals can now be found in the care plan section) Acute Rehab PT Goals Patient Stated Goal: to be able to stand Potential to Achieve Goals: Good Progress towards PT goals: Progressing toward  goals    Frequency    Min 3X/week      PT Plan Current plan remains appropriate    Co-evaluation PT/OT/SLP Co-Evaluation/Treatment: Yes Reason for Co-Treatment: For patient/therapist safety;To address functional/ADL transfers PT goals addressed during session: Mobility/safety with mobility        AM-PAC PT "6 Clicks" Mobility   Outcome Measure  Help needed turning from your back to your side while in a flat bed without using bedrails?: None Help needed moving from lying on your back to sitting on the side of a flat bed without using bedrails?: A Little Help needed moving to and from a bed to a chair (including a wheelchair)?: A Lot Help needed standing up from a chair using your arms (e.g., wheelchair or bedside chair)?: Total Help needed to walk in hospital room?: Total Help needed climbing 3-5 steps with a railing? : Total 6 Click Score: 12    End of Session Equipment Utilized During Treatment: Gait belt Activity Tolerance: Patient tolerated treatment well Patient left: in chair;with call bell/phone within reach;with chair alarm set Nurse Communication: Mobility status PT Visit Diagnosis: Unsteadiness on feet (R26.81);Muscle weakness (generalized) (M62.81);History of falling (Z91.81);Other abnormalities of gait and mobility (R26.89)     Time: 3299-2426 PT Time Calculation (min) (ACUTE ONLY): 23 min  Charges:  $Therapeutic Activity: 8-22 mins                     Lillia Pauls, PT, DPT Acute Rehabilitation Services Office [phone]    Norval Morton 04/25/2022, 10:19 AM

## 2022-04-25 NOTE — Progress Notes (Signed)
Mobility Specialist - Progress Note   04/25/22 1234  Mobility  Activity Transferred from chair to bed  Level of Assistance Moderate assist, patient does 50-74% (+2)  Assistive Device Front wheel walker  Distance Ambulated (ft) 2 ft  RLE Weight Bearing NWB  Activity Response Tolerated well  $Mobility charge 1 Mobility    Pt received in recliner requesting to get back into bed. Left in bed w/ call bell in reach and all needs met.   Paulla Dolly Mobility Specialist

## 2022-04-25 NOTE — Progress Notes (Signed)
Occupational Therapy Treatment Patient Details Name: Shannon Travis MRN: [id number] DOB: May 18, 1986 Today's Date: 04/25/2022   History of present illness 36 y/o female admitted on 04/19/22 following R ankle ORIF after fall x 1 week ago after seizure. PMH: seizures, CP, pre-diabetes   OT comments  Patient continues to make steady progress towards goals in skilled OT session. Patient's session encompassed slide board transfer training to facilitate further functional independence. Patient with excellent maintenance of NWB status on L leg with bed mobility and transfers, with significant decrease in tremors noted with slide board. Patient mod A of 2 for slide board transfers due to fatigue and novelty, with therapy to continue to practice in sessions to promote increased ease of use. OT recommendation updated to SNF due to [facility] not being able to admit. OT will continue to follow.     Recommendations for follow up therapy are one component of a multi-disciplinary discharge planning process, led by the attending physician.  Recommendations may be updated based on patient status, additional functional criteria and insurance authorization.    Follow Up Recommendations  Skilled nursing-short term rehab (<3 hours/day)    Assistance Recommended at Discharge Frequent or constant Supervision/Assistance  Patient can return home with the following  A lot of help with walking and/or transfers;A lot of help with bathing/dressing/bathroom;Assist for transportation;Help with stairs or ramp for entrance;Assistance with cooking/housework   Equipment Recommendations  Other (comment) (Defer to next venue)    Recommendations for Other Services      Precautions / Restrictions Precautions Precautions: Fall Restrictions Weight Bearing Restrictions: Yes RLE Weight Bearing: Non weight bearing       Mobility Bed Mobility Overal bed mobility: Needs Assistance Bed Mobility: Supine to Sit     Supine to  sit: Min guard     General bed mobility comments: Min guard for safety, no physical assist required    Transfers Overall transfer level: Needs assistance Equipment used: Sliding board Transfers: Bed to chair/wheelchair/BSC            Lateral/Scoot Transfers: Mod assist, +2 physical assistance, With slide board General transfer comment: Pt requiring totalA for set up assist, cues for lateral lean to place slide board. Pt completing first two scoots with close min guard assist. To bridge gap and progress fully to chair, required up to modA + 2. Cues for technique provided     Balance Overall balance assessment: Needs assistance Sitting-balance support: Feet supported Sitting balance-Leahy Scale: Good                                     ADL either performed or assessed with clinical judgement   ADL Overall ADL's : Needs assistance/impaired                         Toilet Transfer: Moderate assistance;+2 for physical assistance;+2 for safety/equipment Toilet Transfer Details (indicate cue type and reason): patient completing slide board transfer with OT/PT         Functional mobility during ADLs: Moderate assistance;+2 for physical assistance General ADL Comments: Session focus on slide board transfers    Extremity/Trunk Assessment              Vision       Perception     Praxis      Cognition Arousal/Alertness: Awake/alert Behavior During Therapy: Anxious Overall Cognitive Status: History of cognitive impairments -  at baseline                                 General Comments: Pleasant and encouraged by progress        Exercises      Shoulder Instructions       General Comments      Pertinent Vitals/ Pain       Pain Assessment Pain Assessment: Faces Faces Pain Scale: Hurts a little bit Pain Location: R LE Pain Descriptors / Indicators: Guarding Pain Intervention(s): Repositioned, Monitored during session,  Limited activity within patient's tolerance  Home Living                                          Prior Functioning/Environment              Frequency  Min 2X/week        Progress Toward Goals  OT Goals(current goals can now be found in the care plan section)  Progress towards OT goals: Progressing toward goals  Acute Rehab OT Goals Patient Stated Goal: to feel better OT Goal Formulation: With patient Time For Goal Achievement: 05/04/22 Potential to Achieve Goals: Good  Plan Discharge plan needs to be updated    Co-evaluation      Reason for Co-Treatment: For patient/therapist safety;To address functional/ADL transfers PT goals addressed during session: Mobility/safety with mobility OT goals addressed during session: ADL's and self-care      AM-PAC OT "6 Clicks" Daily Activity     Outcome Measure   Help from another person eating meals?: None Help from another person taking care of personal grooming?: A Little Help from another person toileting, which includes using toliet, bedpan, or urinal?: A Lot Help from another person bathing (including washing, rinsing, drying)?: A Lot Help from another person to put on and taking off regular upper body clothing?: A Little Help from another person to put on and taking off regular lower body clothing?: A Lot 6 Click Score: 16    End of Session Equipment Utilized During Treatment: Gait belt;Other (comment) (Slide board)  OT Visit Diagnosis: Unsteadiness on feet (R26.81)   Activity Tolerance Patient tolerated treatment well   Patient Left in chair;with call bell/phone within reach;with chair alarm set   Nurse Communication Mobility status;Need for lift equipment;Weight bearing status        Time: 6010-9323 OT Time Calculation (min): 23 min  Charges: OT General Charges $OT Visit: 1 Visit OT Treatments $Self Care/Home Management : 8-22 mins  Pollyann Glen E. Yao [place], OTR/L Acute Rehabilitation  Services [phone]   Cherlyn Cushing 04/25/2022, 11:19 AM

## 2022-04-26 LAB — CREATININE, SERUM
Creatinine, Ser: 0.77 mg/dL (ref 0.44–1.00)
GFR, Estimated: >60 mL/min (ref >60)

## 2022-04-26 NOTE — Progress Notes (Signed)
Subjective: 7 Days Post-Op Procedure(s) (LRB): OPEN REDUCTION INTERNAL FIXATION (ORIF) right ANKLE FRACTURE, lateral malleolus (Right) Patient reports pain as 0 on 0-10 scale.  No c/o.  Tolerating regular diet.  OOB with PT today.  PT notes recommend SNF for acute rehab.  Objective: Vital signs in last 24 hours: Temp:  [98.1 F (36.7 C)-98.7 F (37.1 C)] 98.1 F (36.7 C) (10/12 0830) Pulse Rate:  [90-95] 91 (10/12 0830) Resp:  [17-20] 18 (10/12 0830) BP: (110-130)/(51-78) 130/78 (10/12 0830) SpO2:  [100 %] 100 % (10/12 0830)  Intake/Output from previous day: 10/11 0701 - 10/12 0700 In: 480 [P.O.:480] Out: 400 [Urine:400] Intake/Output this shift: Total I/O In: 480 [P.O.:480] Out: 300 [Urine:300]  No results for input(s): "HGB" in the last 72 hours. No results for input(s): "WBC", "RBC", "HCT", "PLT" in the last 72 hours. Recent Labs    04/26/22 0449  CREATININE 0.77   No results for input(s): "LABPT", "INR" in the last 72 hours.  PE:  wn wd woman in nad.  R ankle splinted.  Brisk cap refill in the toes.  Splint in good shape.   Assessment/Plan: 7 Days Post-Op Procedure(s) (LRB): OPEN REDUCTION INTERNAL FIXATION (ORIF) right ANKLE FRACTURE, lateral malleolus (Right) Up with therapy  Continue PT and D/C planning.  Ready for discharge with bed available.      Shannon Travis 04/26/2022, 3:14 PM

## 2022-04-26 NOTE — Progress Notes (Signed)
Mobility Specialist - Progress Note   04/26/22 1131  Mobility  Activity Transferred from chair to bed  Level of Assistance Moderate assist, patient does 50-74% (+2)  Assistive Device Front wheel walker  Distance Ambulated (ft) 2 ft  RLE Weight Bearing NWB  Activity Response Tolerated well  $Mobility charge 1 Mobility    Pt received in recliner requesting to transfer back to bed. Left in bed w/ NT in room.  Paulla Dolly Mobility Specialist

## 2022-04-26 NOTE — TOC Progression Note (Signed)
Transition of Care [facility]) - Progression Note    Patient Details  Name: Shannon Travis MRN: [id number] Date of Birth: [birth year]  [facility] CM/SW Contact  Reece Agar, [place] Phone Number: 04/26/2022, 11:10 AM  Clinical Narrative:    CSW attempted to contact [facility] [address] bed availability for pt. There was no answer CSW left a VM.   Expected Discharge Plan: Home/Self Care Barriers to Discharge: No Barriers Identified  Expected Discharge Plan and Services Expected Discharge Plan: Home/Self Care   Discharge Planning Services: CM Consult Post Acute Care Choice: Durable Medical Equipment Living arrangements for the past 2 months: Single Family Home                 DME Arranged: Walker rolling DME Agency: [employer] Date DME Agency Contacted: 04/20/22 Time DME Agency Contacted: 38 Representative spoke with at DME Agency: [facility] HH Arranged: NA           Social Determinants of Health ([place]) Interventions    Readmission Risk Interventions     No data to display

## 2022-04-26 NOTE — Progress Notes (Signed)
Physical Therapy Treatment Patient Details Name: Shannon Travis MRN: [id number] DOB: [birth year] Today's Date: 04/26/2022   History of Present Illness 36 y/o female admitted on 04/19/22 following R ankle ORIF after fall x 1 week ago after seizure. PMH: seizures, CP, pre-diabetes    PT Comments    Pt received in bed, agreeable to participation in therapy. She required min assist supine to sit, and mod assist +2 safety lateral scoot toward R bed to recliner using sliding board. Pt demonstrated good ability to maintain NWB RLE. Pt in recliner with feet elevated at end of session. Pillow under R distal LE for additional elevation.    Recommendations for follow up therapy are one component of a multi-disciplinary discharge planning process, led by the attending physician.  Recommendations may be updated based on patient status, additional functional criteria and insurance authorization.  Follow Up Recommendations  Skilled nursing-short term rehab (<3 hours/day) Can patient physically be transported by private vehicle: No   Assistance Recommended at Discharge Frequent or constant Supervision/Assistance  Patient can return home with the following Two people to help with walking and/or transfers;A lot of help with bathing/dressing/bathroom;Assistance with cooking/housework;Assist for transportation;Help with stairs or ramp for entrance   Equipment Recommendations  Wheelchair (measurements PT);Wheelchair cushion (measurements PT);BSC/3in1;Other (comment) (slide board, drop-arm BSC)    Recommendations for Other Services       Precautions / Restrictions Precautions Precautions: Fall Required Braces or Orthoses: Splint/Cast Splint/Cast: RLE Splint/Cast - Date Prophylactic Dressing Applied (if applicable): 04/19/22 Restrictions Weight Bearing Restrictions: Yes RLE Weight Bearing: Non weight bearing     Mobility  Bed Mobility Overal bed mobility: Needs Assistance Bed Mobility: Supine to  Sit     Supine to sit: Min assist     General bed mobility comments: +rail, increased time, assist to scoot RLE off EOB    Transfers Overall transfer level: Needs assistance Equipment used: Sliding board Transfers: Bed to chair/wheelchair/BSC            Lateral/Scoot Transfers: Mod assist, +2 safety/equipment, With slide board General transfer comment: lateral scoot bed to recliner toward R using slide board. Cues for sequencing. +2 assist to stabilize recliner.    Ambulation/Gait               General Gait Details: unable due to NWB RLE   Stairs             Wheelchair Mobility    Modified Rankin (Stroke Patients Only)       Balance Overall balance assessment: Needs assistance Sitting-balance support: Feet supported Sitting balance-Leahy Scale: Good                                      Cognition Arousal/Alertness: Awake/alert Behavior During Therapy: WFL for tasks assessed/performed Overall Cognitive Status: History of cognitive impairments - at baseline                                 General Comments: mildly anxious        Exercises      General Comments        Pertinent Vitals/Pain Pain Assessment Pain Assessment: Faces Faces Pain Scale: Hurts a little bit Pain Location: R LE Pain Descriptors / Indicators: Guarding Pain Intervention(s): Monitored during session, Repositioned    Home Living  Prior Function            PT Goals (current goals can now be found in the care plan section) Acute Rehab PT Goals Patient Stated Goal: home Progress towards PT goals: Progressing toward goals    Frequency    Min 3X/week      PT Plan Current plan remains appropriate    Co-evaluation              AM-PAC PT "6 Clicks" Mobility   Outcome Measure  Help needed turning from your back to your side while in a flat bed without using bedrails?: None Help needed  moving from lying on your back to sitting on the side of a flat bed without using bedrails?: A Little Help needed moving to and from a bed to a chair (including a wheelchair)?: A Lot Help needed standing up from a chair using your arms (e.g., wheelchair or bedside chair)?: Total Help needed to walk in hospital room?: Total Help needed climbing 3-5 steps with a railing? : Total 6 Click Score: 12    End of Session Equipment Utilized During Treatment: Gait belt Activity Tolerance: Patient tolerated treatment well Patient left: in chair;with call bell/phone within reach;with chair alarm set;Other (comment) (maximove pad in recliner) Nurse Communication: Mobility status PT Visit Diagnosis: Unsteadiness on feet (R26.81);Muscle weakness (generalized) (M62.81);History of falling (Z91.81);Other abnormalities of gait and mobility (R26.89)     Time: 8756-4332 PT Time Calculation (min) (ACUTE ONLY): 13 min  Charges:  $Therapeutic Activity: 8-22 mins                     Shannon Travis, PT  Office # [phone] Pager [phone]    Shannon Travis 04/26/2022, 10:34 AM

## 2022-04-27 NOTE — Plan of Care (Signed)

## 2022-04-27 NOTE — Progress Notes (Signed)
Subjective: 8 Days Post-Op Procedure(s) (LRB): OPEN REDUCTION INTERNAL FIXATION (ORIF) right ANKLE FRACTURE, lateral malleolus (Right)   I spoke to the patient on the phone.She reports that she just finished working with physical therapy.  She notes that she continues to make progress with therapy.  She is still waiting for bed placement at a skilled nursing facility. She notes that her pain is well controlled.  Objective:   VITALS:  Temp:  [98.2 F (36.8 C)-98.9 F (37.2 C)] 98.9 F (37.2 C) (10/13 0742) Pulse Rate:  [86-95] 86 (10/13 0742) Resp:  [16] 16 (10/13 0742) BP: (110-113)/(62-72) 111/72 (10/13 0742) SpO2:  [95 %-100 %] 100 % (10/13 0742)    LABS No results for input(s): "HGB", "WBC", "PLT" in the last 72 hours. Recent Labs    04/26/22 0449  CREATININE 0.77   No results for input(s): "LABPT", "INR" in the last 72 hours.   Assessment/Plan: 8 Days Post-Op Procedure(s) (LRB): OPEN REDUCTION INTERNAL FIXATION (ORIF) right ANKLE FRACTURE, lateral malleolus (Right)  NWB R LE Up with therapy Disp:  SNF when bed available. D/C summary pended. Patient's mother has D/C scripts Plan for 2 week outpatient post-op visit.  Mechele Claude PA-C [facility] Office:  [phone]

## 2022-04-27 NOTE — TOC Progression Note (Addendum)
Transition of Care [facility]) - Progression Note    Patient Details  Name: Shannon Travis MRN: [id number] Date of Birth: Aug 12, 1985  Transition of Care [facility]) CM/SW [place], [place] Phone Number: 04/27/2022, 11:43 AM  Clinical Narrative:    CSW spoke with pt and mother at bedside, mother is concerned that pt will not have any help at home if she does not get a SNF offer and has to go home with [facility]. Pt still does not have any offers. Pt mother shared that she lives near [facility] and has contacted them to inquire about taking medicaid, they explained that hey do take that insurance if they have the beds available.   CSW contacted [facility] and they confirmed that they do not have any Medicaid beds available. CSW contacted [facility], they will review pt and [facility] not have any available beds at the moment. CSW also inquired with [facility]. Pt has been denied  in the hub for all other facilities.  CSW received a message from [facility] stating that they have medicaid beds available but will review pt and contact CSW if they are able to accept pt.    Expected Discharge Plan: Home/Self Care Barriers to Discharge: No Barriers Identified  Expected Discharge Plan and Services Expected Discharge Plan: Home/Self Care   Discharge Planning Services: CM Consult Post Acute Care Choice: Durable Medical Equipment Living arrangements for the past 2 months: Single Family Home                 DME Arranged: Walker rolling DME Agency: [employer] Date DME Agency Contacted: 04/20/22 Time DME Agency Contacted: 3 Representative spoke with at DME Agency: [facility] HH Arranged: NA           Social Determinants of Health ([place]) Interventions    Readmission Risk Interventions     No data to display

## 2022-04-27 NOTE — Progress Notes (Signed)
Occupational Therapy Treatment Patient Details Name: Shannon Travis MRN: [id number] DOB: [birth year] Today's Date: 04/27/2022   History of present illness 36 y/o female admitted on 04/19/22 following R ankle ORIF after fall x 1 week ago after seizure. PMH: seizures, CP, pre-diabetes   OT comments  Pt progressing nicely toward OT goals this session. Focused on progression of ADL transfers with blocked practiced sit <> stand and 3x10 mini squats in standing with the RW. Heavy mod A to stand from EOB. OT facilitated body mechanics and RLE NWB precautions. Pt occasionally requires cueing for NWB adherence but overall does great with keeping her foot off the floor. She is very motivated to return to PLOF. Continue OT POC and recommend SNF d/c.    Recommendations for follow up therapy are one component of a multi-disciplinary discharge planning process, led by the attending physician.  Recommendations may be updated based on patient status, additional functional criteria and insurance authorization.    Follow Up Recommendations  Skilled nursing-short term rehab (<3 hours/day)    Assistance Recommended at Discharge Frequent or constant Supervision/Assistance  Patient can return home with the following  A lot of help with walking and/or transfers;A lot of help with bathing/dressing/bathroom;Assist for transportation;Help with stairs or ramp for entrance;Assistance with cooking/housework   Equipment Recommendations  None recommended by OT    Recommendations for Other Services      Precautions / Restrictions Precautions Precautions: Fall Required Braces or Orthoses: Splint/Cast Splint/Cast: RLE Splint/Cast - Date Prophylactic Dressing Applied (if applicable): 04/19/22 Restrictions Weight Bearing Restrictions: Yes RLE Weight Bearing: Non weight bearing       Mobility Bed Mobility Overal bed mobility: Needs Assistance Bed Mobility: Sit to Supine, Supine to Sit Rolling: Supervision    Supine to sit: Min guard Sit to supine: Min guard   General bed mobility comments: use of bed rail and extra time d/t truncal ataxia    Transfers Overall transfer level: Needs assistance Equipment used: Rolling walker (2 wheels) Transfers: Sit to/from Stand Sit to Stand: Mod assist, From elevated surface           General transfer comment: Worked on blocked practice sit <> stand and standing tolerance, mod A to stand from EOB and mod A to remain standing. unable to pivot     Balance Overall balance assessment: Needs assistance Sitting-balance support: Feet supported Sitting balance-Leahy Scale: Good     Standing balance support: Bilateral upper extremity supported Standing balance-Leahy Scale: Poor                             ADL either performed or assessed with clinical judgement    Extremity/Trunk Assessment Upper Extremity Assessment Upper Extremity Assessment: Generalized weakness RUE Deficits / Details: generalized weakness and significant tremors RUE Sensation: WNL RUE Coordination: decreased fine motor;decreased gross motor LUE Deficits / Details: General weakness and tremors with movement. LUE Sensation: WNL LUE Coordination: decreased fine motor;decreased gross motor   Lower Extremity Assessment Lower Extremity Assessment: Defer to PT evaluation        Cognition Arousal/Alertness: Awake/alert Behavior During Therapy: WFL for tasks assessed/performed Overall Cognitive Status: History of cognitive impairments - at baseline                                 General Comments: mildly anxious  Pertinent Vitals/ Pain       Pain Assessment Pain Assessment: 0-10 Pain Score: 7  Pain Location: R LE Pain Descriptors / Indicators: Aching Pain Intervention(s): Monitored during session, Limited activity within patient's tolerance   Frequency  Min 2X/week        Progress Toward Goals  OT Goals(current  goals can now be found in the care plan section)  Progress towards OT goals: Progressing toward goals  Acute Rehab OT Goals Patient Stated Goal: get better to walk OT Goal Formulation: With patient Time For Goal Achievement: 05/04/22 Potential to Achieve Goals: Good ADL Goals Pt Will Perform Grooming: with supervision;standing Pt Will Perform Lower Body Bathing: with supervision;sit to/from stand Pt Will Perform Lower Body Dressing: with supervision;sit to/from stand Pt Will Transfer to Toilet: with min assist;stand pivot transfer;bedside commode Pt Will Perform Toileting - Clothing Manipulation and hygiene: with min assist;sit to/from stand  Plan Discharge plan remains appropriate       AM-PAC OT "6 Clicks" Daily Activity     Outcome Measure   Help from another person eating meals?: None Help from another person taking care of personal grooming?: A Little Help from another person toileting, which includes using toliet, bedpan, or urinal?: A Lot Help from another person bathing (including washing, rinsing, drying)?: A Lot Help from another person to put on and taking off regular upper body clothing?: A Little Help from another person to put on and taking off regular lower body clothing?: A Lot 6 Click Score: 16    End of Session Equipment Utilized During Treatment: Rolling walker (2 wheels);Gait belt  OT Visit Diagnosis: Unsteadiness on feet (R26.81)   Activity Tolerance Patient tolerated treatment well   Patient Left in chair;with bed alarm set;with call bell/phone within reach   Nurse Communication Mobility status;Need for lift equipment;Weight bearing status        Time: 1445-1515 OT Time Calculation (min): 30 min  Charges: OT General Charges $OT Visit: 1 Visit OT Treatments $Therapeutic Activity: 23-37 mins  Laverle Hobby, OTR/L, CBIS Acute Rehab Office: [place] 04/27/2022, 3:49 PM

## 2022-04-28 NOTE — TOC Progression Note (Signed)
Transition of Care [facility]) - Initial/Assessment Note    Patient Details  Name: Shannon Travis MRN: [id number] Date of Birth: 10-16-1985  Transition of Care [facility]) CM/SW Contact:    Ralene Bathe, LCSWA Phone Number: 04/28/2022, 2:53 PM  Clinical Narrative:                 CSW completed chart review.  Patient does not have any bed offers at this time.  TOC will continue to follow.   Expected Discharge Plan: Home/Self Care Barriers to Discharge: No Barriers Identified   Patient Goals and CMS Choice Patient states their goals for this hospitalization and ongoing recovery are:: to return to home CMS Medicare.gov Compare Post Acute Care list provided to:: Patient Choice offered to / list presented to : Patient  Expected Discharge Plan and Services Expected Discharge Plan: Home/Self Care   Discharge Planning Services: CM Consult Post Acute Care Choice: Durable Medical Equipment Living arrangements for the past 2 months: Single Family Home                 DME Arranged: Walker rolling DME Agency: [employer] Date DME Agency Contacted: 04/20/22 Time DME Agency Contacted: 1018 Representative spoke with at DME Agency: [facility] HH Arranged: NA          Prior Living Arrangements/Services Living arrangements for the past 2 months: Single Family Home Lives with:: Significant Other Patient language and need for interpreter reviewed:: Yes Do you feel safe going back to the place where you live?: Yes      Need for Family Participation in Patient Care: Yes (Comment) Care giver support system in place?: Yes (comment)   Criminal Activity/Legal Involvement Pertinent to Current Situation/Hospitalization: No - Comment as needed  Activities of Daily Living Home Assistive Devices/Equipment: None ADL Screening (condition at time of admission) Patient's cognitive ability adequate to safely complete daily activities?: No Is the patient deaf or have difficulty hearing?: Yes Does the  patient have difficulty seeing, even when wearing glasses/contacts?: No Does the patient have difficulty concentrating, remembering, or making decisions?: No Patient able to express need for assistance with ADLs?: No Does the patient have difficulty dressing or bathing?: No Independently performs ADLs?: Yes (appropriate for developmental age) Does the patient have difficulty walking or climbing stairs?: No Weakness of Legs: Right Weakness of Arms/Hands: None  Permission Sought/Granted   Permission granted to share information with : No              Emotional Assessment Appearance:: Appears stated age Attitude/Demeanor/Rapport: Engaged Affect (typically observed): Accepting Orientation: : Oriented to Self, Oriented to Place, Oriented to  Time, Oriented to Situation Alcohol / Substance Use: Not Applicable Psych Involvement: No (comment)  Admission diagnosis:  Fibula fracture [S82.409A] Patient Active Problem List   Diagnosis Date Noted   Fibula fracture 04/19/2022   B12 deficiency 01/18/2021   Weakness of both lower extremities 01/18/2021   Vitamin D deficiency 09/30/2020   Encounter for surveillance of contraceptive pills 12/17/2019   Mixed hyperlipidemia 12/17/2019   Family history of blood clots 11/25/2019   Osteopenia of multiple sites 11/24/2019   Arthralgia of right hip 11/16/2019   DDD (degenerative disc disease), lumbar 11/16/2019   Partial epilepsy with impairment of consciousness (HCC) 12/03/2012   Abnormality of gait 12/03/2012   Essential and other specified forms of tremor 12/03/2012   H/O mental disorder 05/15/2011   GAD (generalized anxiety disorder) 05/15/2011   Petit-mal epilepsy (HCC) 05/15/2011   Cerebral palsy (HCC) 05/02/2011  Benign essential tremor 05/02/2011   Bilateral polycystic ovarian syndrome 02/28/2011   PCP:  [facility], [place]:   [id number] [address], [place]  [name] [address] Phone: [phone] Fax: [phone]     Social Determinants of Health (SDOH) Interventions    Readmission Risk Interventions     No data to display

## 2022-04-28 NOTE — Progress Notes (Signed)
   Subjective: 9 Days Post-Op Procedure(s) (LRB): OPEN REDUCTION INTERNAL FIXATION (ORIF) right ANKLE FRACTURE, lateral malleolus (Right)  Overall pt doing well Awaiting SNF placement Denies any new issues or symptoms Patient reports pain as mild.  Objective:   VITALS:   Vitals:   04/28/22 0442 04/28/22 0743  BP: 127/77 (!) 126/91  Pulse: 87 87  Resp: 17 16  Temp: 97.8 F (36.6 C) (!) 97.5 F (36.4 C)  SpO2: 100% 100%    Right ankle: splint in place Nv intact distally No rashes or signs of edema Non weight bearing right lower extremity  LABS No results for input(s): "HGB", "HCT", "WBC", "PLT" in the last 72 hours.  Recent Labs    04/26/22 0449  CREATININE 0.77     Assessment/Plan: 9 Days Post-Op Procedure(s) (LRB): OPEN REDUCTION INTERNAL FIXATION (ORIF) right ANKLE FRACTURE, lateral malleolus (Right) Awaiting SNF bed May d/c once bed available, including today Pain management as needed PT/OT    Kathrynn Speed, [place] is now [facility]  [place] [address], [place], [place], [place] [address] Phone: [phone] [email or web address] Facebook  [employer]

## 2022-04-29 NOTE — Progress Notes (Signed)
   Subjective: 10 Days Post-Op Procedure(s) (LRB): OPEN REDUCTION INTERNAL FIXATION (ORIF) right ANKLE FRACTURE, lateral malleolus (Right)  Pt sitting in bed in no acute distress Denies any new symptoms or issues from yesterday Patient reports pain as mild.  Objective:   VITALS:   Vitals:   04/29/22 0628 04/29/22 0752  BP: 108/73 113/82  Pulse: 83 86  Resp: 16 18  Temp: 98.7 F (37.1 C) (!) 97.4 F (36.3 C)  SpO2: 100% 100%    Right ankle splint intact Nv intact distally No signs of edema or drainage  LABS No results for input(s): "HGB", "HCT", "WBC", "PLT" in the last 72 hours.  No results for input(s): "NA", "K", "BUN", "CREATININE", "GLUCOSE" in the last 72 hours.   Assessment/Plan: 10 Days Post-Op Procedure(s) (LRB): OPEN REDUCTION INTERNAL FIXATION (ORIF) right ANKLE FRACTURE, lateral malleolus (Right) Still awaiting bed placement Pain management    Brad Luna [place], [place] is now [facility]  [place] [address], [place], [place], [place] [address] Phone: [phone] [email or web address] Facebook  [employer]

## 2022-04-30 ENCOUNTER — Encounter (HOSPITAL_BASED_OUTPATIENT_CLINIC_OR_DEPARTMENT_OTHER): Payer: Self-pay | Admitting: Orthopedic Surgery

## 2022-04-30 NOTE — Progress Notes (Signed)
Subjective: 11 Days Post-Op Procedure(s) (LRB): OPEN REDUCTION INTERNAL FIXATION (ORIF) right ANKLE FRACTURE, lateral malleolus (Right)  Patient reports pain as mild to moderate.  Resting comfortably in bed.  Notes that she continues to work well with therapy.   Objective:   VITALS:  Temp:  [97.4 F (36.3 C)-98.4 F (36.9 C)] 97.5 F (36.4 C) (10/16 0729) Pulse Rate:  [84-95] 84 (10/16 0729) Resp:  [16-18] 16 (10/16 0729) BP: (96-113)/(65-82) 104/82 (10/16 0729) SpO2:  [100 %] 100 % (10/16 0729)  General: Overweight patient in NAD. Psych:  Appropriate mood and affect. Neuro:  A&O x 3, Moving all extremities, sensation intact to light touch HEENT:  EOMs intact Chest:  Even non-labored respirations Skin: SLS C/D/I, no rashes or lesions Extremities: warm/dry, no visible edema, erythema or echymosis.  No lymphadenopathy. Pulses: Popliteus 2+ MSK:  ROM: EHL/FHL intact, MMT: able to perform quad set   LABS No results for input(s): "HGB", "WBC", "PLT" in the last 72 hours. No results for input(s): "NA", "K", "CL", "CO2", "BUN", "CREATININE", "GLUCOSE" in the last 72 hours. No results for input(s): "LABPT", "INR" in the last 72 hours.   Assessment/Plan: 11 Days Post-Op Procedure(s) (LRB): OPEN REDUCTION INTERNAL FIXATION (ORIF) right ANKLE FRACTURE, lateral malleolus (Right)  NWB R LE Up with therapy D/C to SNF when bed available.  Mechele Claude PA-C [facility] Office:  [phone]

## 2022-04-30 NOTE — TOC Progression Note (Addendum)
Transition of Care [facility]) - Progression Note    Patient Details  Name: Shannon Travis MRN: [id number] Date of Birth: 03-Feb-1986  [facility]) CM/SW Contact  Reece Agar, [place] Phone Number: 04/30/2022, 3:57 PM  Clinical Narrative:    CSW spoke with pt mother about the status of SNF placement. CSW informed mother that there are no new SNF offers but [facility] has still not made a decision. Pt mother expressed that she does not want [facility] if they offer bc of the ratings. CSW reached out to [facility] who is also still pending a bed offer, they will contact CSW with a response when available.    Expected Discharge Plan: Home/Self Care Barriers to Discharge: No Barriers Identified  Expected Discharge Plan and Services Expected Discharge Plan: Home/Self Care   Discharge Planning Services: CM Consult Post Acute Care Choice: Durable Medical Equipment Living arrangements for the past 2 months: Single Family Home                 DME Arranged: Walker rolling DME Agency: [employer] Date DME Agency Contacted: 04/20/22 Time DME Agency Contacted: 57 Representative spoke with at DME Agency: [facility] HH Arranged: NA           Social Determinants of Health ([place]) Interventions    Readmission Risk Interventions     No data to display

## 2022-04-30 NOTE — Progress Notes (Signed)
Physical Therapy Treatment Patient Details Name: Shannon Travis MRN: [id number] DOB: 03-Aug-1985 Today's Date: 04/30/2022   History of Present Illness 36 y/o female admitted on 04/19/22 following R ankle ORIF after fall x 1 week ago after seizure. PMH: seizures, CP, pre-diabetes    PT Comments    Pt progressing well towards her physical therapy goals and remains motivated to participate. Session focused on transfer training with a variety of methods. Pt ultimately appears to be most successful and require the least amount of assist with slide board transfer. Practiced from bed <> chair x 2 with assist provided for set up. Talked on phone to pt mother with plans to provide family education tomorrow.    Recommendations for follow up therapy are one component of a multi-disciplinary discharge planning process, led by the attending physician.  Recommendations may be updated based on patient status, additional functional criteria and insurance authorization.  Follow Up Recommendations  Skilled nursing-short term rehab (<3 hours/day) Can patient physically be transported by private vehicle: No   Assistance Recommended at Discharge Frequent or constant Supervision/Assistance  Patient can return home with the following Assistance with cooking/housework;Assist for transportation;Help with stairs or ramp for entrance;A little help with walking and/or transfers;A little help with bathing/dressing/bathroom   Equipment Recommendations  Wheelchair (measurements PT);Wheelchair cushion (measurements PT);BSC/3in1;Other (comment) (slide board, drop arm BSC)    Recommendations for Other Services       Precautions / Restrictions Precautions Precautions: Fall Restrictions Weight Bearing Restrictions: Yes RLE Weight Bearing: Non weight bearing     Mobility  Bed Mobility Overal bed mobility: Needs Assistance Bed Mobility: Supine to Sit, Sit to Supine     Supine to sit: Supervision Sit to supine:  Mod assist   General bed mobility comments: Supervision for supine > sit, modA for BLE assist back into bed    Transfers Overall transfer level: Needs assistance Equipment used: Rolling walker (2 wheels), None, Sliding board Transfers: Sit to/from Stand, Bed to chair/wheelchair/BSC Sit to Stand: Mod assist, +2 physical assistance          Lateral/Scoot Transfers: Min guard, +2 safety/equipment, With slide board General transfer comment: Pt performed lateral scoot transfer towards right to recliner with stabilizing assist for chair. Then performed sit to stand from lower surface of recliner with modA + 2; increased time to transition R hand up to walker and difficulty achieving upright posture. Pt then practiced x 4 slide board transfers from bed <> recliner towards right and left with min guard assist + 2. Assist for set up of slide board. Requires multiple scoots and close guarding assist due to incoordination    Ambulation/Gait                   Stairs             Wheelchair Mobility    Modified Rankin (Stroke Patients Only)       Balance Overall balance assessment: Needs assistance Sitting-balance support: Feet supported Sitting balance-Leahy Scale: Fair     Standing balance support: Bilateral upper extremity supported Standing balance-Leahy Scale: Poor                              Cognition Arousal/Alertness: Awake/alert Behavior During Therapy: WFL for tasks assessed/performed Overall Cognitive Status: History of cognitive impairments - at baseline  General Comments: very pleasant and excited with progerss        Exercises      General Comments        Pertinent Vitals/Pain Pain Assessment Pain Assessment: Faces Faces Pain Scale: No hurt    Home Living                          Prior Function            PT Goals (current goals can now be found in the care plan  section) Acute Rehab PT Goals Patient Stated Goal: home Potential to Achieve Goals: Good Progress towards PT goals: Progressing toward goals    Frequency    Min 5X/week      PT Plan Frequency needs to be updated    Co-evaluation              AM-PAC PT "6 Clicks" Mobility   Outcome Measure  Help needed turning from your back to your side while in a flat bed without using bedrails?: None Help needed moving from lying on your back to sitting on the side of a flat bed without using bedrails?: None Help needed moving to and from a bed to a chair (including a wheelchair)?: A Little Help needed standing up from a chair using your arms (e.g., wheelchair or bedside chair)?: A Lot Help needed to walk in hospital room?: Total Help needed climbing 3-5 steps with a railing? : Total 6 Click Score: 15    End of Session Equipment Utilized During Treatment: Gait belt Activity Tolerance: Patient tolerated treatment well Patient left: with call bell/phone within reach;in bed Nurse Communication: Mobility status PT Visit Diagnosis: Unsteadiness on feet (R26.81);Muscle weakness (generalized) (M62.81);History of falling (Z91.81);Other abnormalities of gait and mobility (R26.89)     Time: 6789-3810 PT Time Calculation (min) (ACUTE ONLY): 36 min  Charges:  $Therapeutic Activity: 23-37 mins                     Lillia Pauls, PT, DPT Acute Rehabilitation Services Office [phone]    Norval Morton 04/30/2022, 3:50 PM

## 2022-05-01 NOTE — TOC Initial Note (Addendum)
Transition of Care [facility]) - Initial/Assessment Note    Patient Details  Name: Shannon Travis MRN: [id number] Date of Birth: 05-27-1986  Transition of Care [facility]) CM/SW Contact:    Marilu Favre, RN Phone Number: 05/01/2022, 3:10 PM  Clinical Narrative:                 Spoke to patient at bedside and mother Nicolette Bang via phone [phone]   PT worked with patient : she is progressing, but not yet independent from a wheelchair level which she will need to be in order to stay by herself since the mother works.    PT will see her  5 times/wk to continue to try to work towards goal.   Claiborne Billings with [place] accepted referral for HHPT, however Medicaid only covers three [place] visits. Patient will have one visit , then [place] will need to submit to Medicaid for the next two visits, Medicaid sometimes takes a week to approve. Patient , mother and Mechele Claude [place] and PT aware .   PT recommending wheelchair 22 x 18 with elevating leg rests , 30 inch sliding board and drop arm bedside commode . NCM entered orders and DME progress note for signatures PA aware.   Discussed DME With patient and her mother. If patient needs ambulance transport home at discharge , ambulance will not transport DME. Mother can transport DME in car or TOC can have [employer] deliver to home. Will discuss again closer to discharge.   [name] has NCM direct cell number   Mother states they live in a single story   Expected Discharge Plan: [place] Barriers to Discharge:  (working with PT)   Patient Goals and CMS Choice Patient states their goals for this hospitalization and ongoing recovery are:: to return to home CMS Medicare.gov Compare Post Acute Care list provided to:: Patient Choice offered to / list presented to : Patient  Expected Discharge Plan and Services Expected Discharge Plan: [place]   Discharge Planning Services: CM Consult Post Acute Care Choice:  Home Health, Durable Medical Equipment Living arrangements for the past 2 months: Single Family Home                 DME Arranged:  (see note) DME Agency: [employer] Date DME Agency Contacted: 04/20/22 Time DME Agency Contacted: [id number] Representative spoke with at DME Agency: [place]: PT [place]: [place] Date [place]: 05/01/22 Time [place]: [id number] Representative spoke with at [place]: Claiborne Billings  Prior Living Arrangements/Services Living arrangements for the past 2 months: [place] with:: Parents Patient language and need for interpreter reviewed:: Yes Do you feel safe going back to the place where you live?:  (after working with PT)      Need for Family Participation in Patient Care: Yes (Comment) Care giver support system in place?: Yes (comment)   Criminal Activity/Legal Involvement Pertinent to Current Situation/Hospitalization: No - Comment as needed  Activities of Daily Living Home Assistive Devices/Equipment: None ADL Screening (condition at time of admission) Patient's cognitive ability adequate to safely complete daily activities?: No Is the patient deaf or have difficulty hearing?: Yes Does the patient have difficulty seeing, even when wearing glasses/contacts?: No Does the patient have difficulty concentrating, remembering, or making decisions?: No Patient able to express need for assistance with ADLs?: No Does the patient have difficulty dressing or bathing?: No Independently performs ADLs?: Yes (appropriate for  developmental age) Does the patient have difficulty walking or climbing stairs?: No Weakness of Legs: Right Weakness of Arms/Hands: None  Permission Sought/Granted   Permission granted to share information with : Yes, Verbal Permission Granted  Share Information with NAME: Hollie Beach [phone]           Emotional Assessment Appearance:: Appears stated  age Attitude/Demeanor/Rapport: Engaged Affect (typically observed): Accepting Orientation: : Oriented to Self, Oriented to Place, Oriented to  Time, Oriented to Situation Alcohol / Substance Use: Not Applicable Psych Involvement: No (comment)  Admission diagnosis:  Fibula fracture [S82.409A] Patient Active Problem List   Diagnosis Date Noted   Fibula fracture 04/19/2022   B12 deficiency 01/18/2021   Weakness of both lower extremities 01/18/2021   Vitamin D deficiency 09/30/2020   Encounter for surveillance of contraceptive pills 12/17/2019   Mixed hyperlipidemia 12/17/2019   Family history of blood clots 11/25/2019   Osteopenia of multiple sites 11/24/2019   Arthralgia of right hip 11/16/2019   DDD (degenerative disc disease), lumbar 11/16/2019   Partial epilepsy with impairment of consciousness (HCC) 12/03/2012   Abnormality of gait 12/03/2012   Essential and other specified forms of tremor 12/03/2012   H/O mental disorder 05/15/2011   GAD (generalized anxiety disorder) 05/15/2011   Petit-mal epilepsy (HCC) 05/15/2011   Cerebral palsy (HCC) 05/02/2011   Benign essential tremor 05/02/2011   Bilateral polycystic ovarian syndrome 02/28/2011   PCP:  [facility]:   [id number] [place], [place] - [address] [place] [address] Phone: [phone] Fax: [phone]     Social Determinants of Health (SDOH) Interventions    Readmission Risk Interventions     No data to display

## 2022-05-01 NOTE — Progress Notes (Signed)
Occupational Therapy Treatment Patient Details Name: Shannon Travis MRN: [id number] DOB: 03-13-1986 Today's Date: 05/01/2022   History of present illness 36 y/o female admitted on 04/19/22 following R ankle ORIF after fall x 1 week ago after seizure. PMH: seizures, CP, pre-diabetes   OT comments  Pt progressing towards established OT goals and very motivated. Focused session on LB dressing techniques. Pt donning underwear and pants with Set up at bed level and Mod cues for sequencing. Pt performing lateral scoot to w/c with Min guard A +2 and sliding board. Continue to recommend dc to post-acute rehab to optimize safety and independence with ADLs and functional transfers. Will continue to follow acutely and continue education on LB ADLs, lateral scoot to [facility], and toileting.    Recommendations for follow up therapy are one component of a multi-disciplinary discharge planning process, led by the attending physician.  Recommendations may be updated based on patient status, additional functional criteria and insurance authorization.    Follow Up Recommendations  Skilled nursing-short term rehab (<3 hours/day)    Assistance Recommended at Discharge Frequent or constant Supervision/Assistance  Patient can return home with the following  A lot of help with walking and/or transfers;A lot of help with bathing/dressing/bathroom;Assist for transportation;Help with stairs or ramp for entrance;Assistance with cooking/housework   Equipment Recommendations  None recommended by OT    Recommendations for Other Services      Precautions / Restrictions Precautions Precautions: Fall Required Braces or Orthoses: Splint/Cast Splint/Cast: RLE Splint/Cast - Date Prophylactic Dressing Applied (if applicable): 08/65/78 Restrictions Weight Bearing Restrictions: Yes RLE Weight Bearing: Non weight bearing       Mobility Bed Mobility Overal bed mobility: Needs Assistance Bed Mobility: Supine to Sit,  Rolling Rolling: Supervision   Supine to sit: Supervision     General bed mobility comments: Supervision for safety    Transfers Overall transfer level: Needs assistance   Transfers: Bed to chair/wheelchair/BSC            Lateral/Scoot Transfers: Min guard, +2 safety/equipment, With slide board       Balance Overall balance assessment: Needs assistance Sitting-balance support: No upper extremity supported, Feet supported Sitting balance-Leahy Scale: Good                                     ADL either performed or assessed with clinical judgement   ADL Overall ADL's : Needs assistance/impaired                     Lower Body Dressing: Supervision/safety;Set up;Bed level;Cueing for compensatory techniques;Cueing for sequencing Lower Body Dressing Details (indicate cue type and reason): Pt donning her underwear and shorts at bed level in long sitting. Educating on compensatory techniques including donning LLE first and then rolling side to side for pulling over hips; additional cues for use of bed rails and sequencing. During task, pt performing with set up and Mod cues for sequencing. Pt would benefit from continues practice to decreased fatigue with task and increase independence (without need for cues). Toilet Transfer: Min guard;+2 for Therapist, occupational Details (indicate cue type and reason): Simulated to drop arm w/c. Min Guard A for safety. Cues for sliding board placement and sequencing.         Functional mobility during ADLs: Min guard;+2 for physical assistance;+2 for safety/equipment (lateral scoot) General ADL Comments: Pt practicing LB body dressing at bed level and  performing lateral scoot to w/c with sliding board    Extremity/Trunk Assessment Upper Extremity Assessment Upper Extremity Assessment: Generalized weakness RUE Deficits / Details: generalized weakness and significant tremors LUE Deficits /  Details: General weakness and tremors with movement.   Lower Extremity Assessment Lower Extremity Assessment: Defer to PT evaluation        Vision       Perception     Praxis      Cognition Arousal/Alertness: Awake/alert Behavior During Therapy: WFL for tasks assessed/performed Overall Cognitive Status: History of cognitive impairments - at baseline                                 General Comments: very pleasant and excited with progerss        Exercises      Shoulder Instructions       General Comments Mother present during session    Pertinent Vitals/ Pain       Pain Assessment Pain Assessment: Faces Faces Pain Scale: Hurts little more Pain Location: R LE Pain Descriptors / Indicators: Aching Pain Intervention(s): Monitored during session, Repositioned  Home Living                                          Prior Functioning/Environment              Frequency  Min 2X/week        Progress Toward Goals  OT Goals(current goals can now be found in the care plan section)  Progress towards OT goals: Progressing toward goals  Acute Rehab OT Goals OT Goal Formulation: With patient Time For Goal Achievement: 05/04/22 Potential to Achieve Goals: Good ADL Goals Pt Will Perform Grooming: with supervision;standing Pt Will Perform Lower Body Bathing: with supervision;sit to/from stand Pt Will Perform Lower Body Dressing: with supervision;sit to/from stand Pt Will Transfer to Toilet: with min assist;stand pivot transfer;bedside commode Pt Will Perform Toileting - Clothing Manipulation and hygiene: with min assist;sit to/from stand  Plan Discharge plan remains appropriate    Co-evaluation    PT/OT/SLP Co-Evaluation/Treatment: Yes (Dove tail for safety during transfer) Reason for Co-Treatment: Other (comment) (Dove tail for safety during transfer)   OT goals addressed during session: ADL's and self-care      AM-PAC OT  "6 Clicks" Daily Activity     Outcome Measure   Help from another person eating meals?: None Help from another person taking care of personal grooming?: A Little Help from another person toileting, which includes using toliet, bedpan, or urinal?: A Lot Help from another person bathing (including washing, rinsing, drying)?: A Lot Help from another person to put on and taking off regular upper body clothing?: A Little Help from another person to put on and taking off regular lower body clothing?: A Little 6 Click Score: 17    End of Session    OT Visit Diagnosis: Unsteadiness on feet (R26.81)   Activity Tolerance Patient tolerated treatment well   Patient Left with family/visitor present (in wheelchair with PT)   Nurse Communication Mobility status;Need for lift equipment;Weight bearing status        Time: 1255-1326 OT Time Calculation (min): 31 min  Charges: OT General Charges $OT Visit: 1 Visit OT Treatments $Self Care/Home Management : 23-37 mins  Kelly Eisler MSOT, OTR/L Acute Rehab Office: [phone]  Theodoro Grist Bianney Rockwood 05/01/2022, 2:07 PM

## 2022-05-01 NOTE — TOC CM/SW Note (Signed)
    Durable Medical Equipment  (From admission, onward)           Start     Ordered   05/01/22 1447  For home use only DME Other see comment  Once       Comments: 30 inch sliding board  Question:  Length of Need  Answer:  Lifetime   05/01/22 1448   05/01/22 1446  For home use only DME 3 n 1  Once       Comments: Drop arm   05/01/22 1448   05/01/22 1446  For home use only DME standard manual wheelchair with seat cushion  Once       Comments: Patient suffers from [place] (ORIF) right ANKLE FRACTURE, lateral malleolus (Right)  which impairs their ability to perform daily activities like ambulating  in the home.  A cane  will not resolve issue with performing activities of daily living. A wheelchair will allow patient to safely perform daily activities. Patient can safely propel the wheelchair in the home or has a caregiver who can provide assistance. Length of need Lifetime. Accessories: elevating leg rests (ELRs), wheel locks, extensions and anti-tippers.  22 x 18 inch wheelchair with elevating leg rests   Seat and back cushions   05/01/22 1448

## 2022-05-01 NOTE — Progress Notes (Signed)
Subjective: 12 Days Post-Op Procedure(s) (LRB): OPEN REDUCTION INTERNAL FIXATION (ORIF) right ANKLE FRACTURE, lateral malleolus (Right)  Patient is about 2 weeks out from surgery. Patient reports pain as mild to moderate.  Notes that she continues to work well with therapy.  Tolerating POs well.    Objective:   VITALS:  Temp:  [98.1 F (36.7 C)-98.3 F (36.8 C)] 98.3 F (36.8 C) (10/17 0742) Pulse Rate:  [85-100] 85 (10/17 0742) Resp:  [16-18] 18 (10/17 0742) BP: (93-159)/(61-133) 159/133 (10/17 0742) SpO2:  [92 %-100 %] 100 % (10/17 0742)  General: Overweight patient in NAD. Psych:  Appropriate mood and affect. Neuro:  A&O x 3, Moving all extremities, sensation intact to light touch HEENT:  EOMs intact Chest:  Even non-labored respirations Skin:  SLS removed, incision C/D/I, sutures intact, no rashes or lesions Extremities: warm/dry, mild edema to R ankle, no erythema or echymosis.  No lymphadenopathy. Pulses: Dorsalis Pedis 2+ MSK:  ROM: EHL/FHL intact, MMT: able to perform quad set   LABS No results for input(s): "HGB", "WBC", "PLT" in the last 72 hours. No results for input(s): "NA", "K", "CL", "CO2", "BUN", "CREATININE", "GLUCOSE" in the last 72 hours. No results for input(s): "LABPT", "INR" in the last 72 hours.   Assessment/Plan: 12 Days Post-Op Procedure(s) (LRB): OPEN REDUCTION INTERNAL FIXATION (ORIF) right ANKLE FRACTURE, lateral malleolus (Right)  Sutures were removed, steri-strips applied. New NWB SLC applied. NWB R LE Up with therapy. We'll now plan for a 6 week outpatient post-op visit.  Mechele Claude PA-C [facility] Office:  [phone]

## 2022-05-01 NOTE — Progress Notes (Signed)
Physical Therapy Treatment Patient Details Name: Shannon Travis MRN: [id number] DOB: 1985-12-10 Today's Date: 05/01/2022   History of Present Illness 36 y/o female admitted on 04/19/22 following R ankle ORIF after fall x 1 week ago after seizure. PMH: seizures, CP, pre-diabetes    PT Comments    Pt progressing well towards her physical therapy goals and remains motivated to participate. Session focused on transfer training, wheelchair mobility, and family education regarding DME, level of assist pt is currently needing, and goals of care. Pt requiring min guard assist (+2 safety) for slide board transfers. Initiated pt working on placement and removal of slide board in addition to set up of wheelchair and locking brakes. Pt propelling w/c x 100 ft with BUE's. Pt will need to be modI from a w/c level to return home as her mother works. Will continue efforts.   Recommendations for follow up therapy are one component of a multi-disciplinary discharge planning process, led by the attending physician.  Recommendations may be updated based on patient status, additional functional criteria and insurance authorization.  Follow Up Recommendations  Skilled nursing-short term rehab (<3 hours/day) Can patient physically be transported by private vehicle: Yes   Assistance Recommended at Discharge Frequent or constant Supervision/Assistance  Patient can return home with the following Assistance with cooking/housework;Assist for transportation;Help with stairs or ramp for entrance;A little help with walking and/or transfers;A little help with bathing/dressing/bathroom   Equipment Recommendations  Wheelchair (measurements PT);Wheelchair cushion (measurements PT);BSC/3in1;Other (comment) (22x18 w/c with elevating legrests, 30 inch slide board, drop arm BSC)    Recommendations for Other Services       Precautions / Restrictions Precautions Precautions: Fall Required Braces or Orthoses:  Splint/Cast Splint/Cast: RLE Splint/Cast - Date Prophylactic Dressing Applied (if applicable): 04/19/22 Restrictions Weight Bearing Restrictions: Yes RLE Weight Bearing: Non weight bearing     Mobility  Bed Mobility               General bed mobility comments: Received sitting on EOB    Transfers Overall transfer level: Needs assistance Equipment used: Sliding board Transfers: Bed to chair/wheelchair/BSC            Lateral/Scoot Transfers: Min guard, +2 safety/equipment, With slide board General transfer comment: Pt performed lateral scoot transfer from bed to w/c towards left and then w/c to drop arm recliner towards right. Started to work on placement and removing slide board without PT help; cues for body positioning.    Ambulation/Gait                   Psychologist, counselling mobility: Yes Wheelchair propulsion: Both upper extremities Wheelchair parts: Needs assistance Distance: 100 Wheelchair Assistance Details (indicate cue type and reason): Pt propelled w/c ~100 ft with BUE's; cues provided for propulsion technique, how to turn, and w/c parts/management  Modified Rankin (Stroke Patients Only)       Balance Overall balance assessment: Needs assistance Sitting-balance support: No upper extremity supported, Feet supported Sitting balance-Leahy Scale: Good                                      Cognition Arousal/Alertness: Awake/alert Behavior During Therapy: WFL for tasks assessed/performed Overall Cognitive Status: History of cognitive impairments - at baseline  General Comments: very pleasant and excited with progress        Exercises      General Comments General comments (skin integrity, edema, etc.): Mother present during session      Pertinent Vitals/Pain Pain Assessment Pain Assessment: Faces Faces Pain Scale:  Hurts a little bit Pain Location: R LE Pain Descriptors / Indicators: Aching Pain Intervention(s): Monitored during session    Home Living                          Prior Function            PT Goals (current goals can now be found in the care plan section) Acute Rehab PT Goals Patient Stated Goal: home Potential to Achieve Goals: Good Progress towards PT goals: Progressing toward goals    Frequency    Min 5X/week      PT Plan Current plan remains appropriate    Co-evaluation PT/OT/SLP Co-Evaluation/Treatment: Yes Reason for Co-Treatment: Other (comment) (dove tail for safety during transfer) PT goals addressed during session: Mobility/safety with mobility OT goals addressed during session: ADL's and self-care      AM-PAC PT "6 Clicks" Mobility   Outcome Measure  Help needed turning from your back to your side while in a flat bed without using bedrails?: None Help needed moving from lying on your back to sitting on the side of a flat bed without using bedrails?: None Help needed moving to and from a bed to a chair (including a wheelchair)?: A Little Help needed standing up from a chair using your arms (e.g., wheelchair or bedside chair)?: A Lot Help needed to walk in hospital room?: Total Help needed climbing 3-5 steps with a railing? : Total 6 Click Score: 15    End of Session   Activity Tolerance: Patient tolerated treatment well Patient left: in chair;with call bell/phone within reach;with family/visitor present Nurse Communication: Mobility status PT Visit Diagnosis: Unsteadiness on feet (R26.81);Muscle weakness (generalized) (M62.81);History of falling (Z91.81);Other abnormalities of gait and mobility (R26.89)     Time: 8841-6606 PT Time Calculation (min) (ACUTE ONLY): 45 min  Charges:  $Therapeutic Activity: 23-37 mins                     Wyona Almas, PT, DPT Acute Rehabilitation Services Office [phone]    Deno Etienne 05/01/2022, 3:46 PM

## 2022-05-02 NOTE — Plan of Care (Signed)
  Problem: Education: Goal: Knowledge of General Education information will improve Description Including pain rating scale, medication(s)/side effects and non-pharmacologic comfort measures Outcome: Progressing   Problem: Health Behavior/Discharge Planning: Goal: Ability to manage health-related needs will improve Outcome: Progressing   

## 2022-05-02 NOTE — Progress Notes (Signed)
Physical Therapy Treatment Patient Details Name: Shannon Travis MRN: [id number] DOB: 07/15/1986 Today's Date: 05/02/2022   History of Present Illness 36 y/o female admitted on 04/19/22 following R ankle ORIF after fall x 1 week ago after seizure. PMH: seizures, CP, pre-diabetes    PT Comments    Pt progressing well towards her physical therapy goals and is eager to participate. Session focused on transfer training including bedside commode transfer, sit to stands with RW, and slide board transfers. Pt continues to require the least amount of assist for slide board transfers and will continue to benefit from practice to progress to modI level. Worked on Journalist, newspaper to increase independence.     Recommendations for follow up therapy are one component of a multi-disciplinary discharge planning process, led by the attending physician.  Recommendations may be updated based on patient status, additional functional criteria and insurance authorization.  Follow Up Recommendations  Skilled nursing-short term rehab (<3 hours/day) Can patient physically be transported by private vehicle: Yes   Assistance Recommended at Discharge Frequent or constant Supervision/Assistance  Patient can return home with the following Assistance with cooking/housework;Assist for transportation;Help with stairs or ramp for entrance;A little help with walking and/or transfers;A little help with bathing/dressing/bathroom   Equipment Recommendations  Wheelchair (measurements PT);Wheelchair cushion (measurements PT);BSC/3in1;Other (comment) (22x18 w/c with elevating legrests, 30 inch slide board, drop arm BSC)    Recommendations for Other Services       Precautions / Restrictions Precautions Precautions: Fall Required Braces or Orthoses: Splint/Cast Splint/Cast: RLE Splint/Cast - Date Prophylactic Dressing Applied (if applicable): 89/38/10 Restrictions Weight Bearing Restrictions: Yes RLE Weight  Bearing: Non weight bearing     Mobility  Bed Mobility Overal bed mobility: Needs Assistance Bed Mobility: Supine to Sit     Supine to sit: Supervision     General bed mobility comments: no physical assist required    Transfers Overall transfer level: Needs assistance Equipment used: Rolling walker (2 wheels) Transfers: Sit to/from Stand, Bed to chair/wheelchair/BSC Sit to Stand: Mod assist Stand pivot transfers: Mod assist         General transfer comment: Heavy moderate assist to power up from elevated bed height, pivoting towards left to [facility]. Increased time/effort. Performed slide board transfer from bed <> recliner with min guard assist and stabilization of chair. Cues for head/hip relationship, use of LLE, and sequencing. Pt able to place/remove slide board with min assist. Pt stood additional time from recliner with cues provided for weightbearing precautions    Ambulation/Gait                   Stairs             Wheelchair Mobility    Modified Rankin (Stroke Patients Only)       Balance Overall balance assessment: Needs assistance Sitting-balance support: No upper extremity supported, Feet supported Sitting balance-Leahy Scale: Good     Standing balance support: Bilateral upper extremity supported Standing balance-Leahy Scale: Poor Standing balance comment: heavily reliant through arms on RW                            Cognition Arousal/Alertness: Awake/alert Behavior During Therapy: WFL for tasks assessed/performed Overall Cognitive Status: History of cognitive impairments - at baseline  General Comments: very pleasant and excited with progress        Exercises      General Comments        Pertinent Vitals/Pain Pain Assessment Pain Assessment: Faces Faces Pain Scale: Hurts a little bit Pain Location: R LE Pain Descriptors / Indicators: Aching Pain Intervention(s):  Monitored during session    Home Living                          Prior Function            PT Goals (current goals can now be found in the care plan section) Acute Rehab PT Goals Patient Stated Goal: home Potential to Achieve Goals: Good Progress towards PT goals: Progressing toward goals    Frequency    Min 5X/week      PT Plan Current plan remains appropriate    Co-evaluation              AM-PAC PT "6 Clicks" Mobility   Outcome Measure  Help needed turning from your back to your side while in a flat bed without using bedrails?: None Help needed moving from lying on your back to sitting on the side of a flat bed without using bedrails?: A Little Help needed moving to and from a bed to a chair (including a wheelchair)?: A Little Help needed standing up from a chair using your arms (e.g., wheelchair or bedside chair)?: A Lot Help needed to walk in hospital room?: Total Help needed climbing 3-5 steps with a railing? : Total 6 Click Score: 14    End of Session Equipment Utilized During Treatment: Gait belt Activity Tolerance: Patient tolerated treatment well     PT Visit Diagnosis: Unsteadiness on feet (R26.81);Muscle weakness (generalized) (M62.81);History of falling (Z91.81);Other abnormalities of gait and mobility (R26.89)     Time: [id number] PT Time Calculation (min) (ACUTE ONLY): 51 min  Charges:  $Therapeutic Activity: 38-52 mins                     Wyona Almas, PT, DPT Acute Rehabilitation Services Office [phone]    Deno Etienne 05/02/2022, 11:26 AM

## 2022-05-02 NOTE — Progress Notes (Signed)
I went to patient's room because I heard screaming. Patient was having a panic attack. NT D, and housekeeper Peter Congo came into room and helped me calm patient down.We called her grandmother that also helped calm her. Left patient in room calm and resting.

## 2022-05-03 LAB — CREATININE, SERUM
Creatinine, Ser: 0.81 mg/dL (ref 0.44–1.00)
GFR, Estimated: >60 mL/min (ref >60)

## 2022-05-03 NOTE — Progress Notes (Signed)
Patient requested Shannon Travis to transfer her back to the bed. It took 4 staff members. She got very scared during transfer and stated that she had chest pain. Took vitals-VSS. Paged [facility], talked with the MD on call and asked if he would like me to do EKG. He stated to just monitor her overnight and call if she continued to have symptoms. Presently patient in bed resting comfortably with no chest pain. I reported all this information to [place] the night RN assigned to patient.

## 2022-05-03 NOTE — Progress Notes (Signed)
Subjective: 14 Days Post-Op Procedure(s) (LRB): OPEN REDUCTION INTERNAL FIXATION (ORIF) right ANKLE FRACTURE, lateral malleolus (Right)  Patient reports pain as mild to moderate.  Tolerating POs well.  Admits to BM.  Notes that she continues to work with therapy.  Patient's mother on speaker phone for encounter.  Objective:   VITALS:  Temp:  [98.1 F (36.7 C)-98.5 F (36.9 C)] 98.1 F (36.7 C) (10/19 0606) Pulse Rate:  [90-95] 91 (10/19 0606) Resp:  [16-18] 18 (10/18 1557) BP: (100-114)/(63-102) 114/102 (10/19 0606) SpO2:  [94 %-100 %] 94 % (10/19 0606)  General: Overweight patient in NAD. Psych:  Appropriate mood and affect. Neuro:  A&O x 3, Moving all extremities, sensation intact to light touch HEENT:  EOMs intact Chest:  Even non-labored respirations Skin: SLC C/D/I, no rashes or lesions Extremities: warm/dry, no visible edema, erythema or echymosis.  No lymphadenopathy. Pulses: Popliteus 2+ MSK:  ROM: EHL/FHL intact, MMT: able to perform quad set   LABS No results for input(s): "HGB", "WBC", "PLT" in the last 72 hours. Recent Labs    05/03/22 0455  CREATININE 0.81   No results for input(s): "LABPT", "INR" in the last 72 hours.   Assessment/Plan: 14 Days Post-Op Procedure(s) (LRB): OPEN REDUCTION INTERNAL FIXATION (ORIF) right ANKLE FRACTURE, lateral malleolus (Right)  NWB R LE Up with therapy Patient's mother reports that she is still trying to find a SNF to accept patient.  Mother feels as though she isn't equipped to help take care of her daughter at home. DVT ppx:  Lovenox; transition to Xarelto upon D/C When patient is ready for D/C let me know and I'll place the order. Plan for 6 week outpatient post-op visit.  Mechele Claude PA-C [facility] Office:  [phone]

## 2022-05-03 NOTE — Progress Notes (Signed)
Physical Therapy Treatment Patient Details Name: Shannon Travis MRN: [id number] DOB: 1986-06-13 Today's Date: 05/03/2022   History of Present Illness 36 y/o female admitted on 04/19/22 following R ankle ORIF after fall x 1 week ago after seizure. PMH: seizures, CP, pre-diabetes    PT Comments    Making progress towards functional goals. Heavy emphasis on transfer training today, using sliding board towards left and right to/from various surfaces including wheelchair. Min assist for set-up only, requires extra time but physically performed transfer along sliding board without external assistance. Also practiced stand pivot transfer with mod A +2, tremulous with decreased control of RW requiring assistance to stabilize and balance. Patient will continue to benefit from skilled physical therapy services to further improve independence with functional mobility.    Recommendations for follow up therapy are one component of a multi-disciplinary discharge planning process, led by the attending physician.  Recommendations may be updated based on patient status, additional functional criteria and insurance authorization.  Follow Up Recommendations  Skilled nursing-short term rehab (<3 hours/day) Can patient physically be transported by private vehicle: Yes   Assistance Recommended at Discharge Frequent or constant Supervision/Assistance  Patient can return home with the following Assistance with cooking/housework;Assist for transportation;Help with stairs or ramp for entrance;A little help with walking and/or transfers;A little help with bathing/dressing/bathroom   Equipment Recommendations  Wheelchair (measurements PT);Wheelchair cushion (measurements PT);BSC/3in1;Other (comment) (22x18 w/c with elevating legrests, 30 inch slide board, drop arm BSC)    Recommendations for Other Services       Precautions / Restrictions Precautions Precautions: Fall Required Braces or Orthoses:  Splint/Cast Splint/Cast: RLE Splint/Cast - Date Prophylactic Dressing Applied (if applicable): 56/21/30 Restrictions Weight Bearing Restrictions: Yes RLE Weight Bearing: Non weight bearing     Mobility  Bed Mobility Overal bed mobility: Needs Assistance Bed Mobility: Supine to Sit     Supine to sit: Supervision     General bed mobility comments: no physical assist required. Supervision for safety, tremulous.    Transfers Overall transfer level: Needs assistance Equipment used: Rolling walker (2 wheels) Transfers: Sit to/from Stand, Bed to chair/wheelchair/BSC Sit to Stand: Mod assist, +2 physical assistance Stand pivot transfers: Mod assist, +2 safety/equipment        Lateral/Scoot Transfers: Min guard, +2 safety/equipment, With slide board General transfer comment: Mod assist +2 for boost to stand from w/c and practiced pivoting towards weaker side today (rt). showing erratic control of RW required downward pressure from therapist to stabilize, slow but capable pivot towards recliner from w/c. Mod assist for balance at times. Also performed 3 transfers using sliding board: bed>recliner>w/c. Left and right sides with min assist for set-up and no physical assist to complete scoot. Maintains NWB on Rt, and using LLE appropriately to propel across board. Requires extra time to process.    Ambulation/Gait                   Stairs             Wheelchair Mobility    Modified Rankin (Stroke Patients Only)       Balance Overall balance assessment: Needs assistance Sitting-balance support: No upper extremity supported, Feet supported Sitting balance-Leahy Scale: Good     Standing balance support: Bilateral upper extremity supported Standing balance-Leahy Scale: Poor Standing balance comment: heavily reliant through arms on RW  Cognition Arousal/Alertness: Awake/alert Behavior During Therapy: WFL for tasks  assessed/performed Overall Cognitive Status: History of cognitive impairments - at baseline                                 General Comments: very pleasant; requires considerable amount of time to process motor function        Exercises      General Comments        Pertinent Vitals/Pain Pain Assessment Pain Assessment: Faces Faces Pain Scale: Hurts a little bit Pain Location: R LE Pain Descriptors / Indicators: Aching Pain Intervention(s): Monitored during session, Repositioned    Home Living                          Prior Function            PT Goals (current goals can now be found in the care plan section) Acute Rehab PT Goals Patient Stated Goal: home PT Goal Formulation: With patient Time For Goal Achievement: 05/04/22 Potential to Achieve Goals: Good Progress towards PT goals: Progressing toward goals    Frequency    Min 5X/week      PT Plan Current plan remains appropriate    Co-evaluation              AM-PAC PT "6 Clicks" Mobility   Outcome Measure  Help needed turning from your back to your side while in a flat bed without using bedrails?: None Help needed moving from lying on your back to sitting on the side of a flat bed without using bedrails?: None Help needed moving to and from a bed to a chair (including a wheelchair)?: A Little Help needed standing up from a chair using your arms (e.g., wheelchair or bedside chair)?: A Lot Help needed to walk in hospital room?: Total Help needed climbing 3-5 steps with a railing? : Total 6 Click Score: 15    End of Session Equipment Utilized During Treatment: Gait belt Activity Tolerance: Patient tolerated treatment well Patient left: in chair;with call bell/phone within reach;with chair alarm set Nurse Communication: Mobility status PT Visit Diagnosis: Unsteadiness on feet (R26.81);Muscle weakness (generalized) (M62.81);History of falling (Z91.81);Other abnormalities of  gait and mobility (R26.89)     Time: 7902-4097 PT Time Calculation (min) (ACUTE ONLY): 29 min  Charges:  $Therapeutic Activity: 23-37 mins                     Shannon Travis, PT, DPT Physical Therapist Acute Rehabilitation Services [facility] Outpatient Rehabilitation Services [facility]    Berton Mount 05/03/2022, 5:04 PM

## 2022-05-03 NOTE — Plan of Care (Signed)
  Problem: Education: Goal: Knowledge of General Education information will improve Description Including pain rating scale, medication(s)/side effects and non-pharmacologic comfort measures Outcome: Progressing   Problem: Health Behavior/Discharge Planning: Goal: Ability to manage health-related needs will improve Outcome: Progressing   

## 2022-05-04 NOTE — Progress Notes (Signed)
AVS and social worker's paperwork placed in discharge packet. Report called and given to [place], LPN at [facility]. All questions answered to satisfaction. Awaiting [facility] to transport pt with all belongings.

## 2022-05-04 NOTE — Plan of Care (Signed)
  Problem: Pain Managment: Goal: General experience of comfort will improve Outcome: Progressing   Problem: Safety: Goal: Ability to remain free from injury will improve Outcome: Progressing   Problem: Skin Integrity: Goal: Risk for impaired skin integrity will decrease Outcome: Progressing   

## 2022-05-04 NOTE — Progress Notes (Signed)
Physical Therapy Treatment Patient Details Name: Shannon Travis MRN: [id number] DOB: 1985/11/14 Today's Date: 05/04/2022   History of Present Illness 36 y/o female admitted on 04/19/22 following R ankle ORIF after fall x 1 week ago after seizure. PMH: seizures, CP, pre-diabetes    PT Comments    Pt was seen for progression to the chair with another strategy after doing lateral scoot practice.  Pt is more independent with AP transition, supervision level for this with PT and OT making sure her chair remains up against the bed.  Follow up with her for further practice on this idea with wc and with board as needed.  Follow acute PT goals as are outlined on POC and updated today.   Recommendations for follow up therapy are one component of a multi-disciplinary discharge planning process, led by the attending physician.  Recommendations may be updated based on patient status, additional functional criteria and insurance authorization.  Follow Up Recommendations  Skilled nursing-short term rehab (<3 hours/day) Can patient physically be transported by private vehicle: Yes   Assistance Recommended at Discharge Frequent or constant Supervision/Assistance  Patient can return home with the following Assistance with cooking/housework;Assist for transportation;Help with stairs or ramp for entrance;A little help with walking and/or transfers;A little help with bathing/dressing/bathroom   Equipment Recommendations  Wheelchair (measurements PT);Wheelchair cushion (measurements PT);BSC/3in1;Other (comment)    Recommendations for Other Services       Precautions / Restrictions Precautions Precautions: Fall Required Braces or Orthoses: Splint/Cast Splint/Cast: RLE Splint/Cast - Date Prophylactic Dressing Applied (if applicable): 04/19/22 Restrictions Weight Bearing Restrictions: Yes RLE Weight Bearing: Non weight bearing     Mobility  Bed Mobility Overal bed mobility: Needs Assistance Bed  Mobility: Supine to Sit Rolling: Supervision   Supine to sit: Modified independent (Device/Increase time) Sit to supine: Modified independent (Device/Increase time)        Transfers Overall transfer level: Needs assistance Equipment used: None Transfers: Bed to chair/wheelchair/BSC         Anterior-Posterior transfers: Supervision (with cues for sequence for pt)  Lateral/Scoot Transfers: Min guard, +2 physical assistance, +2 safety/equipment General transfer comment: lateral scoot without board attempted then did AP with success without equiment    Ambulation/Gait               General Gait Details: unable due to NWB RLE   Stairs             Wheelchair Mobility    Modified Rankin (Stroke Patients Only)       Balance Overall balance assessment: Needs assistance Sitting-balance support: Bilateral upper extremity supported Sitting balance-Leahy Scale: Good Sitting balance - Comments: long sitting and then sidesitting on side of bed                                    Cognition Arousal/Alertness: Awake/alert Behavior During Therapy: WFL for tasks assessed/performed Overall Cognitive Status: History of cognitive impairments - at baseline                                 General Comments: taking time to give pt time to process        Exercises      General Comments General comments (skin integrity, edema, etc.): Pt was seen with OT to manage safety with transition to the chair.  Her transfer is done with initially  trying lateral scoot and then converted to AP for safety and was able to be observed by her mother      Pertinent Vitals/Pain Pain Assessment Pain Assessment: No/denies pain Pain Score: 0-No pain    Home Living                          Prior Function            PT Goals (current goals can now be found in the care plan section) Acute Rehab PT Goals Patient Stated Goal: home Progress towards  PT goals: Progressing toward goals    Frequency    Min 5X/week      PT Plan Current plan remains appropriate    Co-evaluation   Reason for Co-Treatment: For patient/therapist safety;To address functional/ADL transfers   OT goals addressed during session: Strengthening/ROM;ADL's and self-care      AM-PAC PT "6 Clicks" Mobility   Outcome Measure  Help needed turning from your back to your side while in a flat bed without using bedrails?: None Help needed moving from lying on your back to sitting on the side of a flat bed without using bedrails?: None Help needed moving to and from a bed to a chair (including a wheelchair)?: A Little Help needed standing up from a chair using your arms (e.g., wheelchair or bedside chair)?: A Lot Help needed to walk in hospital room?: Total Help needed climbing 3-5 steps with a railing? : Total 6 Click Score: 15    End of Session Equipment Utilized During Treatment: Gait belt Activity Tolerance: Patient tolerated treatment well Patient left: in chair;with call bell/phone within reach;with chair alarm set Nurse Communication: Mobility status PT Visit Diagnosis: Unsteadiness on feet (R26.81);Muscle weakness (generalized) (M62.81);History of falling (Z91.81);Other abnormalities of gait and mobility (R26.89)     Time: 0076-2263 PT Time Calculation (min) (ACUTE ONLY): 24 min  Charges:  $Therapeutic Activity: 8-22 mins    Ramond Dial 05/04/2022, 4:07 PM  Mee Hives, PT PhD Acute Rehab Dept. Number: [place]

## 2022-05-04 NOTE — TOC CM/SW Note (Signed)
Patient's mother told SW that [place] delivered DME and she wants it returned .   NCM placed [place] for DME but did not call [employer] for delivery since this admission.   NCM called Lacresia with [place]. They delivered bedside commode and wheel chair on 04/25/22. [employer] will call patient's mother regarding DME.   Mother has accepted SNF bed.   NCM notified Claiborne Billings with [facility]

## 2022-05-04 NOTE — TOC Transition Note (Signed)
Transition of Care [facility]) - CM/SW Discharge Note   Patient Details  Name: Shannon Travis MRN: [id number] Date of Birth: [birth year]  [facility]) CM/SW Contact:  Tresa Endo Phone Number: 05/04/2022, 2:13 PM   Clinical Narrative:    Patient will DC to: [facility] Anticipated DC date: 05/04/2022 Family notified: Pt mother Transport by: Corey Harold   Per MD patient ready for DC to [facility]. RN to call report prior to discharge [phone]). RN, patient, patient's family, and facility notified of DC. Discharge Summary and FL2 sent to facility. DC packet on chart. Ambulance transport requested for patient.   CSW will sign off for now as social work intervention is no longer needed. Please consult [place] again if new needs arise.     Final next level of care: [place] Barriers to Discharge:  (working with PT)   Patient Goals and CMS Choice Patient states their goals for this hospitalization and ongoing recovery are:: to return to home CMS Medicare.gov Compare Post Acute Care list provided to:: Patient Choice offered to / list presented to : Patient  Discharge Placement                       Discharge Plan and Services   Discharge Planning Services: CM Consult Post Acute Care Choice: Home Health, Durable Medical Equipment          DME Arranged:  (see note) DME Agency: [facility] Date DME Agency Contacted: 04/20/22 Time DME Agency Contacted: [id number] Representative spoke with at DME Agency: [place]: PT [place]: [place] Date [place]: 05/01/22 Time [place]: 1509 Representative spoke with at [place]: [place] Determinants of Health ([place]) Interventions     Readmission Risk Interventions     No data to display

## 2022-05-04 NOTE — Plan of Care (Signed)
  Problem: Health Behavior/Discharge Planning: Goal: Ability to manage health-related needs will improve Outcome: Progressing   

## 2022-05-04 NOTE — Progress Notes (Signed)
Subjective: 15 Days Post-Op Procedure(s) (LRB): OPEN REDUCTION INTERNAL FIXATION (ORIF) right ANKLE FRACTURE, lateral malleolus (Right)  Spoke to patient on the phone. Patient reports pain as well controlled.  Notes that she resting comfortably in bed.  States that she continues to work well with therapy.   Objective:   VITALS:  Temp:  [97.4 F (36.3 C)-98.9 F (37.2 C)] 97.4 F (36.3 C) (10/20 0731) Pulse Rate:  [79-94] 79 (10/20 0731) Resp:  [16-18] 16 (10/20 0731) BP: (114-146)/(62-81) 129/75 (10/20 0731) SpO2:  [99 %-100 %] 100 % (10/20 0731)     LABS No results for input(s): "HGB", "WBC", "PLT" in the last 72 hours. Recent Labs    05/03/22 0455  CREATININE 0.81   No results for input(s): "LABPT", "INR" in the last 72 hours.   Assessment/Plan: 15 Days Post-Op Procedure(s) (LRB): OPEN REDUCTION INTERNAL FIXATION (ORIF) right ANKLE FRACTURE, lateral malleolus (Right)  NWB R LE Up with therapy DVT ppx: lovenox in house; transition to xarelto upon D/C Patient's mother has D/C scripts. D/C if SNF bed becomes availabe D/C summary pended Plan for 6 week outpatient post-op visit.  Mechele Claude PA-C [facility] Office:  [phone]

## 2022-05-04 NOTE — TOC Progression Note (Addendum)
Transition of Care [facility]) - Progression Note    Patient Details  Name: Shannon Travis MRN: [id number] Date of Birth: 1985-07-31  Transition of Care [facility]) CM/SW [place], [place] Phone Number: 05/04/2022, 10:12 AM  Clinical Narrative:    CSW spoke with patient mother about getting new offers at [facility] and [facility]. Pt mother would like to review [facility] and follow up with CSW but does not want [facility] since CSW attempted to get bed there previously. If mom does not choose either offer patients alternative plan will be HH.  Pt mother contacted CSW to share that she has looked into [facility] but does not like the reviews and it will be too far away. Pt mother states she would like to visit [facility].   Pt mother then shared that since this is the only facility she will agree.   CSW contacted [facility] to confirm a bed for today, they are able to take her with a 30day LOG. CSW received permission for supervisor and will complete the paperwork so that pt can DC today.   Expected Discharge Plan: [place] Barriers to Discharge:  (working with PT)  Expected Discharge Plan and Services Expected Discharge Plan: [place]   Discharge Planning Services: CM Consult Post Acute Care Choice: Home Health, Durable Medical Equipment Living arrangements for the past 2 months: Single Family Home                 DME Arranged:  (see note) DME Agency: [employer] Date DME Agency Contacted: 04/20/22 Time DME Agency Contacted: 6 Representative spoke with at DME Agency: PAtricia Markle: PT [place]: [place] Date [place]: 05/01/22 Time [place]: [id number] Representative spoke with at [place]: [place] Determinants of Health ([place]) Interventions    Readmission Risk Interventions     No data to display

## 2022-05-04 NOTE — Progress Notes (Signed)
Occupational Therapy Treatment Patient Details Name: Shannon Travis MRN: [id number] DOB: 1986-05-05 Today's Date: 05/04/2022   History of present illness 36 y/o female admitted on 04/19/22 following R ankle ORIF after fall x 1 week ago after seizure. PMH: seizures, CP, pre-diabetes   OT comments  Pt in bed upon therapy arrival and agreeable to participate in OT tx session. OT and PT co-tx completed to further progress functional mobility and transfers. Initially attempted to lateral scoot transfer without SB although pt experienced increased difficulty. Education provided on AP transfer with patient able to complete transfer technique to recliner, back to bed, and to recliner without physical assistance required! Provided VC for form and technique. Pt required increased time to perform transfer. Education provided to patient and mother that transfer technique may be used when transferring to w/c, BSC, etc.   Recommendations for follow up therapy are one component of a multi-disciplinary discharge planning process, led by the attending physician.  Recommendations may be updated based on patient status, additional functional criteria and insurance authorization.    Follow Up Recommendations  Skilled nursing-short term rehab (<3 hours/day)    Assistance Recommended at Discharge Frequent or constant Supervision/Assistance  Patient can return home with the following  A lot of help with bathing/dressing/bathroom;Assist for transportation;Help with stairs or ramp for entrance;Assistance with cooking/housework;A little help with walking and/or transfers   Equipment Recommendations  None recommended by OT       Precautions / Restrictions Precautions Precautions: Fall Required Braces or Orthoses: Splint/Cast Splint/Cast: RLE Splint/Cast - Date Prophylactic Dressing Applied (if applicable): 71/06/26 Restrictions Weight Bearing Restrictions: Yes RLE Weight Bearing: Non weight bearing        Mobility Bed Mobility Overal bed mobility: Needs Assistance Bed Mobility: Supine to Sit     Supine to sit: Modified independent (Device/Increase time) Sit to supine: Modified independent (Device/Increase time)     Patient Response: Cooperative  Transfers Overall transfer level: Needs assistance Equipment used: None Transfers: Bed to chair/wheelchair/BSC         Anterior-Posterior transfers: Supervision (therapists provided stability to recliner for safety to prevent movement during transfer. VC provided to patient on technique during functional transfer.)   General transfer comment: Attempted lateral scoot without SB initially from bed to drop arm recliner although due to pt demonstrating increased difficulty AP transfer was suggested for trail.     Balance Overall balance assessment: No apparent balance deficits (not formally assessed) Sitting-balance support: Feet unsupported, Bilateral upper extremity supported Sitting balance-Leahy Scale: Good Sitting balance - Comments: during long sitting in bed           ADL either performed or assessed with clinical judgement   ADL         Toilet Transfer: Supervision/safety;Set up;Cueing for sequencing;Anterior/posterior Armed forces technical officer Details (indicate cue type and reason): simulated to recliner from/to bed          Cognition Arousal/Alertness: Awake/alert Behavior During Therapy: WFL for tasks assessed/performed Overall Cognitive Status: History of cognitive impairments - at baseline       General Comments: very pleasant; requires considerable amount of time to process motor function and has history of panic attacks                   Pertinent Vitals/ Pain       Pain Assessment Pain Assessment: No/denies pain Pain Score: 0-No pain         Frequency  Min 2X/week        Progress Toward Goals  OT Goals(current goals can now be found in the care plan section)  Progress towards OT goals:  Progressing toward goals     Plan Discharge plan remains appropriate;Frequency remains appropriate    Co-evaluation    PT/OT/SLP Co-Evaluation/Treatment: Yes Reason for Co-Treatment: To address functional/ADL transfers   OT goals addressed during session: Strengthening/ROM;ADL's and self-care      AM-PAC OT "6 Clicks" Daily Activity     Outcome Measure   Help from another person eating meals?: None Help from another person taking care of personal grooming?: A Little Help from another person toileting, which includes using toliet, bedpan, or urinal?: A Lot Help from another person bathing (including washing, rinsing, drying)?: A Lot Help from another person to put on and taking off regular upper body clothing?: A Little Help from another person to put on and taking off regular lower body clothing?: A Little 6 Click Score: 17    End of Session    OT Visit Diagnosis: Unsteadiness on feet (R26.81);Muscle weakness (generalized) (M62.81)   Activity Tolerance Patient tolerated treatment well   Patient Left with family/visitor present (Mother arrived during session)           Time: 1206-1231 OT Time Calculation (min): 25 min  Charges: OT General Charges $OT Visit: 1 Visit OT Treatments $Self Care/Home Management : 8-22 mins  Limmie Patricia, OTR/L,CBIS  Supplemental OT - [facility] and [facility]   Christol Thetford, Charisse March 05/04/2022, 1:25 PM

## 2022-05-04 NOTE — Progress Notes (Signed)
Pt's IV removed and pt dressed.  Discharge via PTAR with all belongings with her including her cell phone.  Pt's mother is planning to meet her at the facility.  Pt in no distress upon departure. Ayesha Mohair BSN RN [place] 05/04/2022, 10:03 PM

## 2022-07-25 ENCOUNTER — Encounter (HOSPITAL_BASED_OUTPATIENT_CLINIC_OR_DEPARTMENT_OTHER): Payer: Self-pay | Admitting: Orthopedic Surgery

## 2022-08-06 ENCOUNTER — Ambulatory Visit: Payer: Medicaid Other | Admitting: Neurology

## 2022-08-06 ENCOUNTER — Telehealth: Payer: Self-pay | Admitting: Neurology

## 2022-08-06 ENCOUNTER — Encounter: Payer: Self-pay | Admitting: Neurology

## 2022-08-06 VITALS — BP 112/77 | HR 80 | Ht 67.0 in | Wt 307.5 lb

## 2022-08-06 DIAGNOSIS — Z5181 Encounter for therapeutic drug level monitoring: Secondary | ICD-10-CM | POA: Diagnosis not present

## 2022-08-06 DIAGNOSIS — G40209 Localization-related (focal) (partial) symptomatic epilepsy and epileptic syndromes with complex partial seizures, not intractable, without status epilepticus: Secondary | ICD-10-CM | POA: Diagnosis not present

## 2022-08-06 DIAGNOSIS — R269 Unspecified abnormalities of gait and mobility: Secondary | ICD-10-CM

## 2022-08-06 DIAGNOSIS — G809 Cerebral palsy, unspecified: Secondary | ICD-10-CM

## 2022-08-06 MED ORDER — PROPRANOLOL HCL 10 MG PO TABS: 10.0000 mg | ORAL_TABLET | Freq: Two times a day (BID) | ORAL | 0 refills | Status: DC

## 2022-08-06 NOTE — Progress Notes (Signed)
PATIENT: Shannon Travis DOB: 09-Jan-1986  REASON FOR VISIT: Follow up for cerebral palsy, gait disorder, seizures HISTORY FROM: Patient and her mother PRIMARY NEUROLOGIST: Dr. Teresa Coombs since Dr. Anne Hahn retired   HISTORY OF PRESENT ILLNESS: Today 08/06/22 Shannon Travis presents today for follow-up, she is accompanied by her mother.  Since last visit in July she has not had any additional seizures but continues to have falls.  Outside of the house she no longer uses a cane, and now she has a rolling walker.  She reported last month she fell and broke her ankle, she is set up to start physical therapy next week.  Her last fall was last Thursday.  She denies any dizziness, denies any lightheadedness before the fall, she reports her legs gave out.  Denies any concussion or loss of consciousness with the falls, denies any seizures prior to the fall.  She is compliant with the seizure medications.  She still struggling with anxiety and tremors, when she is very nervous she has shaking of the bilateral upper extremities.  She reported the fluoxetine has helped but has not completely resolved her symptoms.    INTERVAL HISTORY 01/31/2022:  Patient presents today for follow-up, she is accompanied by her mother.  Last visit was in January, since then she has not had any additional seizures.  She said that her last seizure was more than a year ago.  She is compliant with the Keppra 1000 mg twice daily.  She is also on fluoxetine 40 mg daily and she reported it helped with her anxiety.  She still having anxiety attack while at church singing and sometimes when crossing the road, denies any falls.  Doing well, last seizure was more than a year    INTERVAL HISTORY 08/02/21 Shannon Travis is here today with follow-up history of cerebral palsy, gait disorder, seizures.  On Keppra.  At last visit Prozac was added to help with anxiety, generalized tremor events during pain or being emotionally upset. Claims took for some time  the titration, then stopped. Doesn't remember if it helped. Anxiety continues, especially with ambulation in public places. Completed PT, it helped with gait, has a rolling walker now, uses as needed. Mostly uses during times of known anxiety with ambulation. No seizures (described as staring spells). Taking metformin to help with weight loss. No falls. Tremor in both hands, more in the right, mild in the head. Doing well, here today with her mom.   HISTORY  01/18/2021 Dr. Anne Hahn: Ms. 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  The patient has staring episodes as her typical seizures.  The patient however has also had generalized tremor events unassociated with loss of consciousness that are usually associated with pain or being emotionally upset.  The patient had a fall on 19 December 2020 when she was going out to [employer].  The patient injured her right leg, x-rays were all negative.  The patient has recovered from this.  During that emergency room visit, when she was upset due to the pain she had another typical generalized shaking event unassociated with loss of consciousness.  The typical staring events that represent her seizures have not been observed by her mother in many years.  The patient does not operate a motor vehicle currently.  She is followed through psychiatry for her anxiety, but she has not been on any medications for anxiety.  The patient remains on Keppra 1000 mg  twice daily, it is not clear whether or not the Keppra dosing has worsened the anxiety.  The patient appears to have a lot of fear of falling, she will freeze up frequently when she has to go up on the curb or go into an elevator.  She was to have physical therapy on her last visit but this never occurred.  The patient uses a cane for ambulation.  She does fall with some regularity.  REVIEW OF SYSTEMS: Out of a complete 14 system review of symptoms, the  patient complains only of the following symptoms, and all other reviewed systems are negative.  See HPI  ALLERGIES: No Known Allergies  HOME MEDICATIONS: Outpatient Medications Prior to Visit  Medication Sig Dispense Refill   Cholecalciferol (VITAMIN D3 GUMMIES ADULT PO) Take 2 tablets by mouth every Sunday.     FLUoxetine (PROZAC) 40 MG capsule Take 1 capsule (40 mg total) by mouth daily. (Patient taking differently: Take 40 mg by mouth in the morning.) 90 capsule 3   levETIRAcetam (KEPPRA) 1000 MG tablet TAKE 1 TABLET BY MOUTH TWICE A DAY (Patient taking differently: Take 1,000 mg by mouth 2 (two) times daily.) 180 tablet 3   norethindrone (MICRONOR) 0.35 MG tablet Take 1 tablet by mouth in the morning.     Oyster Shell (OYSTER CALCIUM) 500 MG TABS tablet Take 500 mg of elemental calcium by mouth daily.     topiramate (TOPAMAX) 25 MG tablet Take 100 mg by mouth in the morning.     rivaroxaban (XARELTO) 10 MG TABS tablet Take 1 tablet (10 mg total) by mouth daily for 14 days. 14 tablet 0   No facility-administered medications prior to visit.    PAST MEDICAL HISTORY: Past Medical History:  Diagnosis Date   Abnormality of gait    Anxiety    CP (cerebral palsy) (HCC)    mild per mom   Movement disorder    Pre-diabetes    Seizures ([place])     PAST SURGICAL HISTORY: Past Surgical History:  Procedure Laterality Date   CYSTECTOMY Left    leg   ORIF ANKLE FRACTURE Right 04/19/2022   Procedure: OPEN REDUCTION INTERNAL FIXATION (ORIF) right ANKLE FRACTURE, lateral malleolus;  Surgeon: Wylene Simmer, MD;  Location: [facility];  Service: Orthopedics;  Laterality: Right;    FAMILY HISTORY: Family History  Problem Relation Age of Onset   Healthy Mother     SOCIAL HISTORY: Social History   Socioeconomic History   Marital status: Single    Spouse name: Not on file   Number of children: 0   Years of education: 12   Highest education level: Not on file  Occupational  History   Not on file  Tobacco Use   Smoking status: Never   Smokeless tobacco: Never  Substance and Sexual Activity   Alcohol use: No   Drug use: No   Sexual activity: Not on file  Other Topics Concern   Not on file  Social History Narrative   Patient lives at home with her mother Nicolette Bang.    Patient is currently working part time.    Patient is single.    Patient has no children.    Patient has high school education.    Social Determinants of Health   Financial Resource Strain: Not on file  Food Insecurity: No Food Insecurity (04/19/2022)   Hunger Vital Sign    Worried About Running Out of Food in the Last Year: Never true  Ran Out of Food in the Last Year: Never true  Transportation Needs: No Transportation Needs (04/19/2022)   PRAPARE - Hydrologist (Medical): No    Lack of Transportation (Non-Medical): No  Physical Activity: Not on file  Stress: Not on file  Social Connections: Not on file  Intimate Partner Violence: Not At Risk (04/19/2022)   Humiliation, Afraid, Rape, and Kick questionnaire    Fear of Current or Ex-Partner: No    Emotionally Abused: No    Physically Abused: No    Sexually Abused: No   PHYSICAL EXAM  Vitals:   08/06/22 0914  BP: 112/77  Pulse: 80  Weight: (!) 307 lb 8 oz (139.5 kg)  Height: 5\' 7"  (1.702 m)    Body mass index is 48.16 kg/m.  Generalized: Well developed, in no acute distress   Neurological examination  Mentation: Alert oriented to time, place, history taking. Follows all commands speech and language fluent Cranial nerve II-XII: Pupils were equal round reactive to light. Extraocular movements were full, visual field were full on confrontational test. Facial sensation and strength were normal. Head turning and shoulder shrug  were normal and symmetric. Motor: The motor testing reveals 5 over 5 strength of all 4 extremities. Good symmetric motor tone is noted throughout.  Sensory: Sensory  testing is intact to soft touch on all 4 extremities. No evidence of extinction is noted.  Coordination: Cerebellar testing reveals good finger-nose-finger but she has enhance physiological tremors. Athetoid movements of head and arms noted. Gait and station: Gait is slightly wide-based, mildly spastic gait, uses a walker   DIAGNOSTIC DATA (LABS, IMAGING, TESTING) - I reviewed patient records, labs, notes, testing and imaging myself where available.  Lab Results  Component Value Date   WBC 6.9 04/19/2022   HGB 14.0 04/19/2022   HCT 41.8 04/19/2022   MCV 91.7 04/19/2022   PLT 323 04/19/2022      Component Value Date/Time   CREATININE 0.81 05/03/2022 0455   GFRNONAA >60 05/03/2022 0455   No results found for: "CHOL", "HDL", "LDLCALC", "LDLDIRECT", "TRIG", "CHOLHDL" No results found for: "HGBA1C" No results found for: "VITAMINB12" No results found for: "TSH"  ASSESSMENT AND PLAN 37 y.o. year old female  has a past medical history of Abnormality of gait, Anxiety, CP (cerebral palsy) ([place]), Movement disorder, Pre-diabetes, and Seizures ([place]). here with:  1.  Cerebral palsy, athetoid   2.  Gait disorder   3.  Anxiety disorder   4.  Seizure disorder, under good control  -Kara is doing well today, Continue Keppra and Topiramate at current dosing - Continue with Fluoxetine 40 mg daily, will add low dose propanolol for her tremors associated with situation anxiety (church, crossing the road) -Referral to physical therapy for gait training  -Follow-up in 6 months      Alric Ran, MD 08/06/2022, 9:18 AM [facility] [address], [place], [place] [address] [phone]

## 2022-08-06 NOTE — Telephone Encounter (Signed)
[place] med[place] NPR sent to GI [phone] 

## 2022-08-07 LAB — LEVETIRACETAM LEVEL: Levetiracetam Lvl: 58.9 ug/mL — ABNORMAL HIGH (ref 10.0–40.0)

## 2022-08-27 ENCOUNTER — Telehealth: Payer: Self-pay | Admitting: Neurology

## 2022-08-27 NOTE — Telephone Encounter (Signed)
Pt mother is calling. Stated Dr. April Manson asked her to call once pt finish medication. Stated pt is finish but she didn't see any changes with pt.

## 2022-08-27 NOTE — Telephone Encounter (Signed)
If there were no changes while on the propanolol,  no need to renew the Rx.

## 2022-08-27 NOTE — Telephone Encounter (Signed)
Called and spoke to pt's mother and informed her of provider's message. She verbalized understanding and appreciation and confirmed that the pt will be at her next scheduled appt.

## 2022-08-28 ENCOUNTER — Ambulatory Visit
Admission: RE | Admit: 2022-08-28 | Discharge: 2022-08-28 | Disposition: A | Discharge status: Home / Self Care | Payer: Medicaid Other | Source: Ambulatory Visit | Attending: Neurology | Admitting: Neurology

## 2022-08-28 DIAGNOSIS — G809 Cerebral palsy, unspecified: Secondary | ICD-10-CM

## 2022-08-28 MED ORDER — GADOPICLENOL 0.5 MMOL/ML IV SOLN
10.0000 mL | Freq: Once | INTRAVENOUS | Status: AC | PRN
  Administered 2022-08-28: 10 mL via INTRAVENOUS

## 2022-09-10 ENCOUNTER — Ambulatory Visit: Payer: Medicaid Other | Attending: Neurology | Admitting: Physical Therapy

## 2022-09-10 DIAGNOSIS — M6281 Muscle weakness (generalized): Secondary | ICD-10-CM | POA: Diagnosis present

## 2022-09-10 DIAGNOSIS — R2681 Unsteadiness on feet: Secondary | ICD-10-CM | POA: Insufficient documentation

## 2022-09-10 DIAGNOSIS — G809 Cerebral palsy, unspecified: Secondary | ICD-10-CM | POA: Diagnosis not present

## 2022-09-10 DIAGNOSIS — R278 Other lack of coordination: Secondary | ICD-10-CM | POA: Diagnosis present

## 2022-09-10 DIAGNOSIS — R2689 Other abnormalities of gait and mobility: Secondary | ICD-10-CM | POA: Diagnosis present

## 2022-09-10 NOTE — Therapy (Signed)
OUTPATIENT PHYSICAL THERAPY NEURO EVALUATION   Patient Name: Shannon Travis MRN: [id number] DOB:Nov 28, 1985, 37 y.o., female Today's Date: 09/10/2022   PCP: Junie Spencer PA-C [place] REFERRING PROVIDER: Alric Ran, MD  END OF SESSION:  PT End of Session - 09/10/22 0847     Visit Number 1    Number of Visits 9   with eval   Date for PT Re-Evaluation 10/22/22    Authorization Type Medicaid    PT Start Time 0845    PT Stop Time 0925    PT Time Calculation (min) 40 min    Equipment Utilized During Treatment Gait belt    Activity Tolerance Patient tolerated treatment well    Behavior During Therapy [facility] for tasks assessed/performed;Anxious             Past Medical History:  Diagnosis Date   Abnormality of gait    Anxiety    CP (cerebral palsy) (HCC)    mild per mom   Movement disorder    Pre-diabetes    Seizures ([place])    Past Surgical History:  Procedure Laterality Date   CYSTECTOMY Left    leg   ORIF ANKLE FRACTURE Right 04/19/2022   Procedure: OPEN REDUCTION INTERNAL FIXATION (ORIF) right ANKLE FRACTURE, lateral malleolus;  Surgeon: Wylene Simmer, MD;  Location: [facility];  Service: Orthopedics;  Laterality: Right;   Patient Active Problem List   Diagnosis Date Noted   Fibula fracture 04/19/2022   B12 deficiency 01/18/2021   Weakness of both lower extremities 01/18/2021   Vitamin D deficiency 09/30/2020   Encounter for surveillance of contraceptive pills 12/17/2019   Mixed hyperlipidemia 12/17/2019   Family history of blood clots 11/25/2019   Osteopenia of multiple sites 11/24/2019   Arthralgia of right hip 11/16/2019   DDD (degenerative disc disease), lumbar 11/16/2019   Partial epilepsy with impairment of consciousness ([place]) 12/03/2012   Abnormality of gait 12/03/2012   Essential and other specified forms of tremor 12/03/2012   H/O mental disorder 05/15/2011   GAD (generalized anxiety disorder) 05/15/2011   Petit-mal  epilepsy ([place]) 05/15/2011   Cerebral palsy ([place]) 05/02/2011   Benign essential tremor 05/02/2011   Bilateral polycystic ovarian syndrome 02/28/2011    ONSET DATE: 08/06/2022  REFERRING DIAG:    G80.9 (ICD-10-CM) - Cerebral palsy, unspecified type ([place])    THERAPY DIAG:  Muscle weakness (generalized)  Unsteadiness on feet  Other abnormalities of gait and mobility  Other lack of coordination  Rationale for Evaluation and Treatment: Rehabilitation  SUBJECTIVE:  SUBJECTIVE STATEMENT:  Pt's mom assists pt with providing her history, she reports pt has a history of issues with her gait and that when the patient starts to walk her legs get really weak and can lock up on her. She reports that the patient has fallen a couple of times because her legs just give out on her. Pt also reports that she just finished up PT for her R broken ankle. Pt was using a rollator full time but it was giving her back pain due to being too short so she transitioned to using a QC "hurricane". Pt reports she is still working on her R ankle exercises even though she has graduated from [employer]. Pt reports she did receive PT services at this clinic a few years ago and worked with walking with her rollator outdoors and up/down curbs.  Pt accompanied by: self, mom Angela  PERTINENT HISTORY: anxiety, CP, pre-diabetes, seizures, s/p ORIF ankle fracture 04/19/2022  PAIN:  Are you having pain? No  PRECAUTIONS: Fall and Other: seizure  WEIGHT BEARING RESTRICTIONS: No  FALLS: Has patient fallen in last 6 months? Yes. Number of falls 3; broke toes of R foot then broke R ankle  LIVING ENVIRONMENT: Lives with: lives with their family (lives with mom) Lives in: House/apartment Stairs: No Has following equipment at home: Materials engineer, Environmental consultant - 4 wheeled, and Ramped entry  PLOF: Independent with gait, Independent with transfers, and Requires assistive device for independence  PATIENT GOALS: "mostly to get my balance" "get over fear of falling and stepping off of curbs and uneven pavement"  OBJECTIVE:   DIAGNOSTIC FINDINGS:  R ankle xray 04/05/2022 FINDINGS: Mildly displaced oblique fracture is seen involving the distal right fibula. The tibia is unremarkable.   IMPRESSION: Mildly displaced distal right fibular fracture.    COGNITION: Overall cognitive status: History of cognitive impairments - at baseline   SENSATION: WFL  COORDINATION: slighty decreased in BLE  POSTURE: rounded shoulders, forward head, and posterior pelvic tilt  LOWER EXTREMITY ROM:     Active  Right Eval Left Eval  Hip flexion    Hip extension    Hip abduction    Hip adduction    Hip internal rotation    Hip external rotation    Knee flexion    Knee extension hyperextension hyperextension  Ankle dorsiflexion    Ankle plantarflexion    Ankle inversion    Ankle eversion     (Blank rows = not tested)  LOWER EXTREMITY MMT:    MMT Right Eval Left Eval  Hip flexion 5 5  Hip extension    Hip abduction    Hip adduction    Hip internal rotation    Hip external rotation    Knee flexion 4 5  Knee extension 4 5  Ankle dorsiflexion 4 4  Ankle plantarflexion    Ankle inversion    Ankle eversion    (Blank rows = not tested)  BED MOBILITY:  Independent per pt report  TRANSFERS: Assistive device utilized: None  Sit to stand: Modified independence Stand to sit: Modified independence Chair to chair: Modified independence Floor:  not assessed at eval  GAIT: Gait pattern:  some path deviation, genu recurvatum- Right, genu recurvatum- Left, and wide BOS Distance walked: 115 ft Assistive device utilized: Walker - 4 wheeled (adjusted height for patient) Level of assistance: Modified independence Comments:  increased anxiety with obstacle navigation with decreased gait speed  Gait pattern: genu recurvatum- Right, genu recurvatum-  Left, and wide BOS Distance walked: 115 ft Assistive device utilized: Quad cane small base "hurricane" Level of assistance: CGA Comments: several standing rest breaks due to SOA   FUNCTIONAL TESTS:    [facility] PT Assessment - 09/10/22 0907       Ambulation/Gait   Gait velocity 32.8 ft over 12.72 sec = 2.58 ft/sec      Standardized Balance Assessment   Standardized Balance Assessment Five Times Sit to Stand;Timed Up and Go Test    Five times sit to stand comments  13.43 sec   no UE support     Timed Up and Go Test   TUG Normal TUG    Normal TUG (seconds) 24.56   with rollator, difficulty on turns            TODAY'S TREATMENT:                                                                                                                              PT Evaluation   PATIENT EDUCATION: Education details: Eval findings, PT POC Person educated: Patient and Parent Education method: Customer service manager Education comprehension: verbalized understanding and needs further education  HOME EXERCISE PROGRAM: To be initiated next session  GOALS: Goals reviewed with patient? Yes  SHORT TERM GOALS=LONG TERM GOALS due to length of POC   LONG TERM GOALS: Target date: 10/08/2022  Pt will be independent with final HEP for improved strength, balance, transfers and gait. Baseline:  Goal status: INITIAL  2.  Pt will improve gait velocity to at least 2.75 ft/sec for improved gait efficiency and performance at mod I level with LRAD Baseline: 2.58 ft/sec with rollator (2/26) Goal status: INITIAL  3.  Pt will improve normal TUG to less than or equal to 20 seconds for improved functional mobility and decreased fall risk. Baseline: 24.56 sec with rollator (2/26) Goal status: INITIAL  4.  Pt will navigate up/down a curb with LRAD at mod I level for return  to community mobility Baseline:  Goal status: INITIAL  5.  Pt will ambulate x 500 ft outdoors across uneven ground and sidewalks with LRAD at mod I level for return to community mobility Baseline:  Goal status: INITIAL   ASSESSMENT:  CLINICAL IMPRESSION: Patient is a 36 year old female referred to Neuro OPPT for gait impairments due to her CP.   Pt's PMH is significant for: anxiety, CP, pre-diabetes, seizures, s/p ORIF ankle fracture 04/19/2022. The following deficits were present during the exam: decreased gait speed, decreased balance, decreased functional LE strength. Based on her gait speed of 2.58 ft/sec and TUG score of 24.56 sec, pt is an increased risk for falls. Pt would benefit from skilled PT to address these impairments and functional limitations to maximize functional mobility independence.   OBJECTIVE IMPAIRMENTS: Abnormal gait, decreased balance, decreased knowledge of condition, decreased mobility, difficulty walking, decreased strength, impaired perceived functional ability, and postural dysfunction.   ACTIVITY LIMITATIONS: carrying,  lifting, bending, sitting, standing, stairs, and transfers  PARTICIPATION LIMITATIONS: community activity  PERSONAL FACTORS: Time since onset of injury/illness/exacerbation and 1-2 comorbidities:    anxiety, CP, pre-diabetes, seizures, s/p ORIF ankle fracture 10/5/2023are also affecting patient's functional outcome.   REHAB POTENTIAL: Good  CLINICAL DECISION MAKING: Stable/uncomplicated  EVALUATION COMPLEXITY: Low  PLAN:  PT FREQUENCY: 1-2x/week  PT DURATION: 4 weeks  PLANNED INTERVENTIONS: Therapeutic exercises, Therapeutic activity, Neuromuscular re-education, Balance training, Gait training, Patient/Family education, Self Care, Joint mobilization, Stair training, DME instructions, Dry Needling, Electrical stimulation, Cryotherapy, Moist heat, Taping, Manual therapy, and Re-evaluation  PLAN FOR NEXT SESSION: initiate HEP for  balance, endurance, LE strengthening (resisted sit to stands, TKE, squats, step-ups, sidesteps); work on gait across uneven surfaces and navigating around obstacles with both rollator and QC "hurricane", gait outdoors, up/down curbs with LRAD   Excell Seltzer, PT, DPT, CSRS 09/10/2022, 9:26 AM

## 2022-09-14 ENCOUNTER — Ambulatory Visit: Payer: Medicaid Other | Admitting: Physical Therapy

## 2022-09-17 ENCOUNTER — Ambulatory Visit: Payer: Medicaid Other | Admitting: Physical Therapy

## 2022-09-21 ENCOUNTER — Ambulatory Visit: Payer: Medicaid Other | Admitting: Physical Therapy

## 2022-09-24 ENCOUNTER — Other Ambulatory Visit: Payer: Self-pay

## 2022-09-24 ENCOUNTER — Ambulatory Visit: Payer: Medicaid Other | Admitting: Physical Therapy

## 2022-09-24 MED ORDER — LEVETIRACETAM 1000 MG PO TABS: 1000.0000 mg | ORAL_TABLET | Freq: Two times a day (BID) | ORAL | 3 refills | Status: DC

## 2022-09-27 ENCOUNTER — Ambulatory Visit: Payer: Medicaid Other | Attending: Neurology | Admitting: Physical Therapy

## 2022-09-27 DIAGNOSIS — R2681 Unsteadiness on feet: Secondary | ICD-10-CM | POA: Diagnosis present

## 2022-09-27 DIAGNOSIS — R293 Abnormal posture: Secondary | ICD-10-CM | POA: Diagnosis present

## 2022-09-27 DIAGNOSIS — M6281 Muscle weakness (generalized): Secondary | ICD-10-CM | POA: Insufficient documentation

## 2022-09-27 DIAGNOSIS — R2689 Other abnormalities of gait and mobility: Secondary | ICD-10-CM | POA: Diagnosis present

## 2022-09-27 DIAGNOSIS — R278 Other lack of coordination: Secondary | ICD-10-CM | POA: Insufficient documentation

## 2022-09-27 NOTE — Therapy (Signed)
OUTPATIENT PHYSICAL THERAPY NEURO TREATMENT   Patient Name: Shannon Travis MRN: [id number] DOB:07/01/1986, 37 y.o., female Today's Date: 09/27/2022   PCP: Junie Spencer PA-C [place] REFERRING PROVIDER: Alric Ran, MD  END OF SESSION:  PT End of Session - 09/27/22 0803     Visit Number 2    Number of Visits 9   with eval   Date for PT Re-Evaluation 10/22/22    Authorization Type Medicaid    Authorization Time Period 3 visits 09/27/22-10/17/22    Authorization - Visit Number 1    PT Start Time 0800    PT Stop Time 0845    PT Time Calculation (min) 45 min    Equipment Utilized During Treatment Gait belt    Activity Tolerance Patient tolerated treatment well    Behavior During Therapy [facility] for tasks assessed/performed;Anxious              Past Medical History:  Diagnosis Date   Abnormality of gait    Anxiety    CP (cerebral palsy) (HCC)    mild per mom   Movement disorder    Pre-diabetes    Seizures ([place])    Past Surgical History:  Procedure Laterality Date   CYSTECTOMY Left    leg   ORIF ANKLE FRACTURE Right 04/19/2022   Procedure: OPEN REDUCTION INTERNAL FIXATION (ORIF) right ANKLE FRACTURE, lateral malleolus;  Surgeon: Wylene Simmer, MD;  Location: [place] [facility];  Service: Orthopedics;  Laterality: Right;   Patient Active Problem List   Diagnosis Date Noted   Fibula fracture 04/19/2022   B12 deficiency 01/18/2021   Weakness of both lower extremities 01/18/2021   Vitamin D deficiency 09/30/2020   Encounter for surveillance of contraceptive pills 12/17/2019   Mixed hyperlipidemia 12/17/2019   Family history of blood clots 11/25/2019   Osteopenia of multiple sites 11/24/2019   Arthralgia of right hip 11/16/2019   DDD (degenerative disc disease), lumbar 11/16/2019   Partial epilepsy with impairment of consciousness ([place]) 12/03/2012   Abnormality of gait 12/03/2012   Essential and other specified forms of tremor 12/03/2012   H/O  mental disorder 05/15/2011   GAD (generalized anxiety disorder) 05/15/2011   Petit-mal epilepsy ([place]) 05/15/2011   Cerebral palsy ([place]) 05/02/2011   Benign essential tremor 05/02/2011   Bilateral polycystic ovarian syndrome 02/28/2011    ONSET DATE: 08/06/2022  REFERRING DIAG:    G80.9 (ICD-10-CM) - Cerebral palsy, unspecified type ([place])    THERAPY DIAG:  Muscle weakness (generalized)  Unsteadiness on feet  Other abnormalities of gait and mobility  Abnormal posture  Other lack of coordination  Rationale for Evaluation and Treatment: Rehabilitation  SUBJECTIVE:  SUBJECTIVE STATEMENT:  Pt reports no pain today, no falls. Pt states that her R ankle is doing "good", still working on exercises from other clinic. Pt's biggest concern is being able to go up/down curbs and ramps with LRAD out in the community.  Pt accompanied by: self, mom [name] (in lobby)  PERTINENT HISTORY: anxiety, CP, pre-diabetes, seizures, s/p ORIF ankle fracture 04/19/2022  PAIN:  Are you having pain? No  PRECAUTIONS: Fall and Other: seizure  WEIGHT BEARING RESTRICTIONS: No  FALLS: Has patient fallen in last 6 months? Yes. Number of falls 3; broke toes of R foot then broke R ankle  LIVING ENVIRONMENT: Lives with: lives with their family (lives with mom) Lives in: House/apartment Stairs: No Has following equipment at home: Lobbyist, Environmental consultant - 4 wheeled, and Ramped entry  PLOF: Independent with gait, Independent with transfers, and Requires assistive device for independence  PATIENT GOALS: "mostly to get my balance" "get over fear of falling and stepping off of curbs and uneven pavement"  OBJECTIVE:   DIAGNOSTIC FINDINGS:  R ankle xray 04/05/2022 FINDINGS: Mildly displaced oblique fracture is seen  involving the distal right fibula. The tibia is unremarkable.   IMPRESSION: Mildly displaced distal right fibular fracture.    COGNITION: Overall cognitive status: History of cognitive impairments - at baseline   SENSATION: WFL  COORDINATION: slighty decreased in BLE  POSTURE: rounded shoulders, forward head, and posterior pelvic tilt  LOWER EXTREMITY ROM:     Active  Right Eval Left Eval  Hip flexion    Hip extension    Hip abduction    Hip adduction    Hip internal rotation    Hip external rotation    Knee flexion    Knee extension hyperextension hyperextension  Ankle dorsiflexion    Ankle plantarflexion    Ankle inversion    Ankle eversion     (Blank rows = not tested)  LOWER EXTREMITY MMT:    MMT Right Eval Left Eval  Hip flexion 5 5  Hip extension    Hip abduction    Hip adduction    Hip internal rotation    Hip external rotation    Knee flexion 4 5  Knee extension 4 5  Ankle dorsiflexion 4 4  Ankle plantarflexion    Ankle inversion    Ankle eversion    (Blank rows = not tested)    TODAY'S TREATMENT:                                                                                                                              TherEx: Standing mini-squats with airex pad on mat table as target for correct body mechanics, 2 x 10 reps Resisted sit to stands with RTB around thighs, x 10 reps  Added to HEP, see bolded below  Gait: Gait pattern: genu recurvatum- Right, genu recurvatum- Left, and wide BOS Distance walked: various distances outdoors Assistive device utilized: Herbalist  base "hurricane" Level of assistance: SBA Comments: practiced walking outdoors across uneven sidewalk, up/down inclines, and up/down curb with CGA  Initially tried ramp and curb with both "huricane" and rollator in clinic, pt with increased anxiety and increased assist needed when managing rollator up/down ramp so deferred to just using hurricane this session.     PATIENT EDUCATION: Education details: initiated HEP, work on Associate Professor in home environment Person educated: Patient and Parent Education method: Customer service manager Education comprehension: verbalized understanding and needs further education  HOME EXERCISE PROGRAM: Access Code: [email or web address] URL: [email or web address] Date: 09/27/2022 Prepared by: Excell Seltzer  Exercises - Mini Squat with Counter Support  - 1 x daily - 7 x weekly - 3 sets - 10 reps - Sit to Stand with Resistance Around Legs  - 1 x daily - 7 x weekly - 3 sets - 10 reps  GOALS: Goals reviewed with patient? Yes  SHORT TERM GOALS=LONG TERM GOALS due to length of POC   LONG TERM GOALS: Target date: 10/08/2022  Pt will be independent with final HEP for improved strength, balance, transfers and gait. Baseline:  Goal status: INITIAL  2.  Pt will improve gait velocity to at least 2.75 ft/sec for improved gait efficiency and performance at mod I level with LRAD Baseline: 2.58 ft/sec with rollator (2/26) Goal status: INITIAL  3.  Pt will improve normal TUG to less than or equal to 20 seconds for improved functional mobility and decreased fall risk. Baseline: 24.56 sec with rollator (2/26) Goal status: INITIAL  4.  Pt will navigate up/down a curb with LRAD at mod I level for return to community mobility Baseline:  Goal status: INITIAL  5.  Pt will ambulate x 500 ft outdoors across uneven ground and sidewalks with LRAD at mod I level for return to community mobility Baseline:  Goal status: INITIAL   ASSESSMENT:  CLINICAL IMPRESSION: Emphasis of skilled PT session on working on curb and ramp navigation with LRAD as well as initiating HEP for LE strengthening. Pt exhibits increased anxiety and increased assist needed to navigate curb and ramp with rollator so just utilized hurricane this session. Pt initially requires min A for balance when navigating curbs and ramp with hurricane but  progresses to CGA by end of session. Pt continues to have some hesitancy and needs increased time to navigate curb due to fear of falling. Pt will benefit from continued practice of functional community environment navigation with LRAD to work towards decreased fall risk and increased safety and independence. Continue POC.   OBJECTIVE IMPAIRMENTS: Abnormal gait, decreased balance, decreased knowledge of condition, decreased mobility, difficulty walking, decreased strength, impaired perceived functional ability, and postural dysfunction.   ACTIVITY LIMITATIONS: carrying, lifting, bending, sitting, standing, stairs, and transfers  PARTICIPATION LIMITATIONS: community activity  PERSONAL FACTORS: Time since onset of injury/illness/exacerbation and 1-2 comorbidities:    anxiety, CP, pre-diabetes, seizures, s/p ORIF ankle fracture 10/5/2023are also affecting patient's functional outcome.   REHAB POTENTIAL: Good  CLINICAL DECISION MAKING: Stable/uncomplicated  EVALUATION COMPLEXITY: Low  PLAN:  PT FREQUENCY: 1-2x/week  PT DURATION: 4 weeks  PLANNED INTERVENTIONS: Therapeutic exercises, Therapeutic activity, Neuromuscular re-education, Balance training, Gait training, Patient/Family education, Self Care, Joint mobilization, Stair training, DME instructions, Dry Needling, Electrical stimulation, Cryotherapy, Moist heat, Taping, Manual therapy, and Re-evaluation  PLAN FOR NEXT SESSION: add to HEP for balance, endurance, LE strengthening (TKE, step-ups, sidesteps); work on gait across uneven surfaces and navigating around obstacles with QC "hurricane", gait outdoors, up/down curbs with LRAD,  wants to work towards being able to step on/off treadmill   Excell Seltzer, PT, DPT, CSRS 09/27/2022, 8:47 AM

## 2022-09-28 ENCOUNTER — Ambulatory Visit: Payer: Medicaid Other | Admitting: Physical Therapy

## 2022-10-01 ENCOUNTER — Ambulatory Visit: Payer: Medicaid Other | Admitting: Physical Therapy

## 2022-10-04 ENCOUNTER — Ambulatory Visit: Payer: Medicaid Other | Admitting: Physical Therapy

## 2022-10-04 DIAGNOSIS — M6281 Muscle weakness (generalized): Secondary | ICD-10-CM

## 2022-10-04 DIAGNOSIS — R293 Abnormal posture: Secondary | ICD-10-CM

## 2022-10-04 DIAGNOSIS — R2689 Other abnormalities of gait and mobility: Secondary | ICD-10-CM

## 2022-10-04 DIAGNOSIS — R2681 Unsteadiness on feet: Secondary | ICD-10-CM

## 2022-10-04 NOTE — Therapy (Signed)
OUTPATIENT PHYSICAL THERAPY NEURO TREATMENT   Patient Name: Shannon Travis MRN: [id number] DOB:01-06-1986, 37 y.o., female Today's Date: 10/04/2022   PCP: Junie Spencer PA-C [place] REFERRING PROVIDER: Alric Ran, MD  END OF SESSION:  PT End of Session - 10/04/22 0846     Visit Number 3    Number of Visits 9   with eval   Date for PT Re-Evaluation 10/22/22    Authorization Type Medicaid    Authorization Time Period 3 visits 09/27/22-10/17/22    PT Start Time 0845    PT Stop Time 0928    PT Time Calculation (min) 43 min    Equipment Utilized During Treatment Gait belt    Activity Tolerance Patient tolerated treatment well    Behavior During Therapy [facility] for tasks assessed/performed;Anxious               Past Medical History:  Diagnosis Date   Abnormality of gait    Anxiety    CP (cerebral palsy) (HCC)    mild per mom   Movement disorder    Pre-diabetes    Seizures ([place])    Past Surgical History:  Procedure Laterality Date   CYSTECTOMY Left    leg   ORIF ANKLE FRACTURE Right 04/19/2022   Procedure: OPEN REDUCTION INTERNAL FIXATION (ORIF) right ANKLE FRACTURE, lateral malleolus;  Surgeon: Wylene Simmer, MD;  Location: [place];  Service: Orthopedics;  Laterality: Right;   Patient Active Problem List   Diagnosis Date Noted   Fibula fracture 04/19/2022   B12 deficiency 01/18/2021   Weakness of both lower extremities 01/18/2021   Vitamin D deficiency 09/30/2020   Encounter for surveillance of contraceptive pills 12/17/2019   Mixed hyperlipidemia 12/17/2019   Family history of blood clots 11/25/2019   Osteopenia of multiple sites 11/24/2019   Arthralgia of right hip 11/16/2019   DDD (degenerative disc disease), lumbar 11/16/2019   Partial epilepsy with impairment of consciousness ([place]) 12/03/2012   Abnormality of gait 12/03/2012   Essential and other specified forms of tremor 12/03/2012   H/O mental disorder 05/15/2011    GAD (generalized anxiety disorder) 05/15/2011   Petit-mal epilepsy ([place]) 05/15/2011   Cerebral palsy ([place]) 05/02/2011   Benign essential tremor 05/02/2011   Bilateral polycystic ovarian syndrome 02/28/2011    ONSET DATE: 08/06/2022  REFERRING DIAG:    G80.9 (ICD-10-CM) - Cerebral palsy, unspecified type ([place])    THERAPY DIAG:  Muscle weakness (generalized)  Unsteadiness on feet  Other abnormalities of gait and mobility  Abnormal posture  Rationale for Evaluation and Treatment: Rehabilitation  SUBJECTIVE:  SUBJECTIVE STATEMENT: Pt reports no falls or acute change since last visit. No pain today. Pt reports her HEP is going well, did try small curb at home but continues to have some anxiety and hesitancy with navigating it with her cane.  Pt accompanied by: self, mom [name] (in lobby)  PERTINENT HISTORY: anxiety, CP, pre-diabetes, seizures, s/p ORIF ankle fracture 04/19/2022  PAIN:  Are you having pain? No  PRECAUTIONS: Fall and Other: seizure  WEIGHT BEARING RESTRICTIONS: No  FALLS: Has patient fallen in last 6 months? Yes. Number of falls 3; broke toes of R foot then broke R ankle  LIVING ENVIRONMENT: Lives with: lives with their family (lives with mom) Lives in: House/apartment Stairs: No Has following equipment at home: Lobbyist, Environmental consultant - 4 wheeled, and Ramped entry  PLOF: Independent with gait, Independent with transfers, and Requires assistive device for independence  PATIENT GOALS: "mostly to get my balance" "get over fear of falling and stepping off of curbs and uneven pavement"  OBJECTIVE:   DIAGNOSTIC FINDINGS:  R ankle xray 04/05/2022 FINDINGS: Mildly displaced oblique fracture is seen involving the distal right fibula. The tibia is unremarkable.    IMPRESSION: Mildly displaced distal right fibular fracture.    COGNITION: Overall cognitive status: History of cognitive impairments - at baseline   SENSATION: WFL  COORDINATION: slighty decreased in BLE  POSTURE: rounded shoulders, forward head, and posterior pelvic tilt  LOWER EXTREMITY ROM:     Active  Right Eval Left Eval  Hip flexion    Hip extension    Hip abduction    Hip adduction    Hip internal rotation    Hip external rotation    Knee flexion    Knee extension hyperextension hyperextension  Ankle dorsiflexion    Ankle plantarflexion    Ankle inversion    Ankle eversion     (Blank rows = not tested)  LOWER EXTREMITY MMT:    MMT Right Eval Left Eval  Hip flexion 5 5  Hip extension    Hip abduction    Hip adduction    Hip internal rotation    Hip external rotation    Knee flexion 4 5  Knee extension 4 5  Ankle dorsiflexion 4 4  Ankle plantarflexion    Ankle inversion    Ankle eversion    (Blank rows = not tested)    TODAY'S TREATMENT:                                                                                                                               Gait/TherAct:  Gait pattern: genu recurvatum- Right, genu recurvatum- Left, and wide BOS Distance walked: various distances outdoors Assistive device utilized: Lobbyist "hurricane" Level of assistance: SBA Comments: practiced walking outdoors across uneven sidewalk, up/down inclines, and up/down curb with close SBA   In // bars with use of hurricane and SBA: 4" step ups/downs with focus on  increasing confidence with step navigation  Transitioned to up/down 6" curb with hurricane, increased hesitancy and anxiety with descending curb and ultimately needs min HHA due to fear of falling  In // bars with BUE support and CGA: 4" curb eccentric step downs, increased difficulty when stepping down with L as compared to R  Transitioned to 6" curb again with hurricane, increased  anxiety with descending curb  Transitioned to 4" curbs outdoors with hurricane and close SBA, decreased anxiety with this as compared to 6" curb in clinic  Discussed PT POC with patient and her mom, plan to recert next visit to continue to work on curb and community environment navigation to improve pt's confidence with functional gait and mobility.   PATIENT EDUCATION: Education details: continue HEP and working on Associate Professor in home environment Person educated: Patient and Parent Education method: Customer service manager Education comprehension: verbalized understanding and needs further education  HOME EXERCISE PROGRAM: Access Code: [email or web address] URL: [email or web address] Date: 09/27/2022 Prepared by: Excell Seltzer  Exercises - Mini Squat with Counter Support  - 1 x daily - 7 x weekly - 3 sets - 10 reps - Sit to Stand with Resistance Around Legs  - 1 x daily - 7 x weekly - 3 sets - 10 reps  GOALS: Goals reviewed with patient? Yes  SHORT TERM GOALS=LONG TERM GOALS due to length of POC   LONG TERM GOALS: Target date: 10/08/2022  Pt will be independent with final HEP for improved strength, balance, transfers and gait. Baseline:  Goal status: INITIAL  2.  Pt will improve gait velocity to at least 2.75 ft/sec for improved gait efficiency and performance at mod I level with LRAD Baseline: 2.58 ft/sec with rollator (2/26) Goal status: INITIAL  3.  Pt will improve normal TUG to less than or equal to 20 seconds for improved functional mobility and decreased fall risk. Baseline: 24.56 sec with rollator (2/26) Goal status: INITIAL  4.  Pt will navigate up/down a curb with LRAD at mod I level for return to community mobility Baseline:  Goal status: INITIAL  5.  Pt will ambulate x 500 ft outdoors across uneven ground and sidewalks with LRAD at mod I level for return to community mobility Baseline:  Goal status: INITIAL   ASSESSMENT:  CLINICAL  IMPRESSION: Emphasis of skilled PT session on continuing to work on improving confidence with curb navigation to work towards increasing independence with community mobility. Pt continues to exhibit significant anxiety with curb navigation in clinic setting but does better in community setting outdoors. Pt would benefit from continued skilled therapy services to work towards her LTGs. Continue POC.   OBJECTIVE IMPAIRMENTS: Abnormal gait, decreased balance, decreased knowledge of condition, decreased mobility, difficulty walking, decreased strength, impaired perceived functional ability, and postural dysfunction.   ACTIVITY LIMITATIONS: carrying, lifting, bending, sitting, standing, stairs, and transfers  PARTICIPATION LIMITATIONS: community activity  PERSONAL FACTORS: Time since onset of injury/illness/exacerbation and 1-2 comorbidities:    anxiety, CP, pre-diabetes, seizures, s/p ORIF ankle fracture 10/5/2023are also affecting patient's functional outcome.   REHAB POTENTIAL: Good  CLINICAL DECISION MAKING: Stable/uncomplicated  EVALUATION COMPLEXITY: Low  PLAN:  PT FREQUENCY: 1-2x/week  PT DURATION: 4 weeks  PLANNED INTERVENTIONS: Therapeutic exercises, Therapeutic activity, Neuromuscular re-education, Balance training, Gait training, Patient/Family education, Self Care, Joint mobilization, Stair training, DME instructions, Dry Needling, Electrical stimulation, Cryotherapy, Moist heat, Taping, Manual therapy, and Re-evaluation  PLAN FOR NEXT SESSION: reassess goals and recert/request more visits from Medicaid (1x/week for 4 weeks)--will need  to schedule more visits, add to HEP for balance, endurance, LE strengthening (TKE, step-ups, sidesteps); work on gait across uneven surfaces and navigating around obstacles with QC "hurricane", gait outdoors, up/down curbs with LRAD, wants to work towards being able to step on/off treadmill   Excell Seltzer, PT, DPT, CSRS 10/04/2022, 9:30  AM

## 2022-10-05 ENCOUNTER — Ambulatory Visit: Payer: Medicaid Other | Admitting: Physical Therapy

## 2022-10-08 ENCOUNTER — Encounter: Payer: Self-pay | Admitting: Physical Therapy

## 2022-10-08 ENCOUNTER — Ambulatory Visit: Payer: Medicaid Other | Admitting: Physical Therapy

## 2022-10-08 VITALS — BP 100/75 | HR 74

## 2022-10-08 DIAGNOSIS — M6281 Muscle weakness (generalized): Secondary | ICD-10-CM | POA: Diagnosis not present

## 2022-10-08 DIAGNOSIS — R2681 Unsteadiness on feet: Secondary | ICD-10-CM

## 2022-10-08 DIAGNOSIS — R293 Abnormal posture: Secondary | ICD-10-CM

## 2022-10-08 DIAGNOSIS — R2689 Other abnormalities of gait and mobility: Secondary | ICD-10-CM

## 2022-10-08 NOTE — Therapy (Signed)
OUTPATIENT PHYSICAL THERAPY NEURO TREATMENT / RE-CERTIFCATION   Patient Name: Shannon Travis MRN: [id number] DOB:[birth year], 37 y.o., female Today's Date: 10/08/2022   PCP: Junie Spencer PA-C [place] REFERRING PROVIDER: Alric Ran, MD  END OF SESSION:  PT End of Session - 10/08/22 0800     Visit Number 4    Number of Visits 8    Date for PT Re-Evaluation 11/19/22    Authorization Type Medicaid    Authorization Time Period --    Authorization - Visit Number 1    PT Start Time 0800    PT Stop Time 0842    PT Time Calculation (min) 42 min    Equipment Utilized During Treatment Gait belt    Activity Tolerance Patient tolerated treatment well    Behavior During Therapy [facility] for tasks assessed/performed;Anxious             Past Medical History:  Diagnosis Date   Abnormality of gait    Anxiety    CP (cerebral palsy) (HCC)    mild per mom   Movement disorder    Pre-diabetes    Seizures ([place])    Past Surgical History:  Procedure Laterality Date   CYSTECTOMY Left    leg   ORIF ANKLE FRACTURE Right 04/19/2022   Procedure: OPEN REDUCTION INTERNAL FIXATION (ORIF) right ANKLE FRACTURE, lateral malleolus;  Surgeon: Wylene Simmer, MD;  Location: [place];  Service: Orthopedics;  Laterality: Right;   Patient Active Problem List   Diagnosis Date Noted   Fibula fracture 04/19/2022   B12 deficiency 01/18/2021   Weakness of both lower extremities 01/18/2021   Vitamin D deficiency 09/30/2020   Encounter for surveillance of contraceptive pills 12/17/2019   Mixed hyperlipidemia 12/17/2019   Family history of blood clots 11/25/2019   Osteopenia of multiple sites 11/24/2019   Arthralgia of right hip 11/16/2019   DDD (degenerative disc disease), lumbar 11/16/2019   Partial epilepsy with impairment of consciousness ([place]) 12/03/2012   Abnormality of gait 12/03/2012   Essential and other specified forms of tremor 12/03/2012   H/O mental disorder  05/15/2011   GAD (generalized anxiety disorder) 05/15/2011   Petit-mal epilepsy ([place]) 05/15/2011   Cerebral palsy ([place]) 05/02/2011   Benign essential tremor 05/02/2011   Bilateral polycystic ovarian syndrome 02/28/2011    ONSET DATE: 08/06/2022  REFERRING DIAG:    G80.9 (ICD-10-CM) - Cerebral palsy, unspecified type ([place])    THERAPY DIAG:  Muscle weakness (generalized) - Plan: PT plan of care cert/re-cert  Unsteadiness on feet - Plan: PT plan of care cert/re-cert  Other abnormalities of gait and mobility - Plan: PT plan of care cert/re-cert  Abnormal posture - Plan: PT plan of care cert/re-cert  Rationale for Evaluation and Treatment: Rehabilitation  SUBJECTIVE:  SUBJECTIVE STATEMENT: Pt reports no acute changes; no falls/near falls. Still reports some ongoing fear navigating curbs. Patient is using her [facility] for all navigation at this time.   Pt accompanied by: self, mom [name] (in lobby)  PERTINENT HISTORY: anxiety, CP, pre-diabetes, seizures, s/p ORIF ankle fracture 04/19/2022  PAIN:  Are you having pain? No  PRECAUTIONS: Fall and Other: seizure  WEIGHT BEARING RESTRICTIONS: No  FALLS: Has patient fallen in last 6 months? Yes. Number of falls 3; broke toes of R foot then broke R ankle  LIVING ENVIRONMENT: Lives with: lives with their family (lives with mom) Lives in: House/apartment Stairs: No Has following equipment at home: Lobbyist, Environmental consultant - 4 wheeled, and Ramped entry  PLOF: Independent with gait, Independent with transfers, and Requires assistive device for independence  PATIENT GOALS: "mostly to get my balance" "get over fear of falling and stepping off of curbs and uneven pavement"  OBJECTIVE:   DIAGNOSTIC FINDINGS:  R ankle xray 04/05/2022 FINDINGS: Mildly  displaced oblique fracture is seen involving the distal right fibula. The tibia is unremarkable.   IMPRESSION: Mildly displaced distal right fibular fracture.    COGNITION: Overall cognitive status: History of cognitive impairments - at baseline  TODAY'S TREATMENT:                                                                                                                               Therex: 6" Step ups with RUE support in // bars (CGA) x 10 6" Step downs with RUE support in // bars (CGA) x 10 Airex step downs with RUE support in // bars (CGA) x 10   TherAct: Assessement of Goals as Indicated Below  [facility] PT Assessment - 10/08/22 0001       Ambulation/Gait   Gait velocity 3.03 ft/sec   with SPC (SBA)     Timed Up and Go Test   TUG Normal TUG    Normal TUG (seconds) 19.21   with SPC (SBA - pause on turn to regain balance)             CURB:  Level of Assistance: CGA Assistive device utilized: Single point cane Curb Comments: Unsure particularly with step down; appropriate sequencing of AD, increased anxiety increased extra movement pattens, paused for appropriate pacing  GAIT: Gait pattern:  required intermittent breaks to improve coordination, decreased hip/knee flexion- Right, decreased hip/knee flexion- Left, poor foot clearance- Right, and poor foot clearance- Left Distance walked: 500 feet (25min5sec) Assistive device utilized: Single point cane Level of assistance: SBA and CGA Comments: Required CGA x 1 due to trip over level ground but able to recover with appropriate stepping reaction; performed in doors due to weather  PATIENT EDUCATION: Education details: continue HEP and working on Associate Professor in home environment Person educated: Patient and Parent Education method: Customer service manager Education comprehension: verbalized understanding and needs further education  HOME EXERCISE PROGRAM: Access Code: [email or web address] URL:  [email or web address] Date: 09/27/2022 Prepared by: Excell Seltzer  Exercises - Mini Squat with Counter Support  - 1 x daily - 7 x weekly - 3 sets - 10 reps - Sit to Stand with Resistance Around Legs  - 1 x daily - 7 x weekly - 3 sets - 10 reps  GOALS: Goals reviewed with patient? Yes  SHORT TERM GOALS=LONG TERM GOALS due to length of POC   LONG TERM GOALS: Target date: 10/08/2022  Pt will be independent with final HEP for improved strength, balance, transfers and gait. Baseline: Reports that she is doing initial exercises at home Goal status: IN PROGRESS  2.  Pt will improve gait velocity to at least 2.75 ft/sec for improved gait efficiency and performance at mod I level with LRAD Baseline: 2.58 ft/sec with rollator (2/26); improved to 3.03 ft/sec with SPC (SBA) Goal status: MET  3.  Pt will improve normal TUG to less than or equal to 20 seconds for improved functional mobility and decreased fall risk. Baseline: 24.56 sec with rollator (2/26); improved to 19.21 sec with SPC (SBA) Goal status: IN PROGRESS  4.  Pt will navigate up/down a curb with LRAD at mod I level for return to community mobility Baseline: CGA with SPC Goal status: IN PROGRESS  5.  Pt will ambulate x 500 ft outdoors across uneven ground and sidewalks with LRAD at mod I level for return to community mobility Baseline: Pt ambulates 500 ft  with SPC in doors, 1x trip but able to regain balance with stepping  Goal status: IN PROGRESS   ASSESSMENT:  CLINICAL IMPRESSION: Emphasis of skilled PT session on assessment of goals and recertification of POC. Patient achived 1/5 LTG and is progressing well towards remaining 5. Patient improved gait speed to 3.03 ft /sec indicating level of unlimited community ambulator. Patient demonstrating progress towards a decreased risk for falls as indicated by improvement in TUG from 24.56 seconds with rollator to 19.21 seconds with SPC indicating progress also in  LRAD. Patient continues to demonstrate apprehension with curb navigation and will benefit from continued practice in this area. Pt also demonstrates one instance of tripping over level ground in today's session but demonstrates appropriate stepping reaction to regain balance; will continue to progress to dynamic outdoor environment. Pt would benefit from continued skilled therapy services to work towards her LTGs. Continue POC.   OBJECTIVE IMPAIRMENTS: Abnormal gait, decreased balance, decreased knowledge of condition, decreased mobility, difficulty walking, decreased strength, impaired perceived functional ability, and postural dysfunction.   ACTIVITY LIMITATIONS: carrying, lifting, bending, sitting, standing, stairs, and transfers  PARTICIPATION LIMITATIONS: community activity  PERSONAL FACTORS: Time since onset of injury/illness/exacerbation and 1-2 comorbidities:    anxiety, CP, pre-diabetes, seizures, s/p ORIF ankle fracture 10/5/2023are also affecting patient's functional outcome.   REHAB POTENTIAL: Good  CLINICAL DECISION MAKING: Stable/uncomplicated  EVALUATION COMPLEXITY: Low  PLAN:  PT FREQUENCY: 1x/week  PT DURATION: 4 weeks (additional to what already completed)  PLANNED INTERVENTIONS: Therapeutic exercises, Therapeutic activity, Neuromuscular re-education, Balance training, Gait training, Patient/Family education, Self Care, Joint mobilization, Stair training, DME instructions, Dry Needling, Electrical stimulation, Cryotherapy, Moist heat, Taping, Manual therapy, and Re-evaluation  PLAN FOR NEXT SESSION:  add to HEP for balance, endurance, LE strengthening (TKE, step-ups, sidesteps); work on gait across uneven surfaces and navigating around obstacles with QC "hurricane", gait outdoors, up/down curbs with LRAD, wants to work towards being able to step on/off treadmill   Esperanza Heir, PT, DPT 10/08/2022, 9:07 AM

## 2022-10-16 ENCOUNTER — Other Ambulatory Visit: Payer: Self-pay | Admitting: Neurology

## 2022-10-17 ENCOUNTER — Ambulatory Visit: Payer: Medicaid Other | Attending: Neurology | Admitting: Physical Therapy

## 2022-10-17 DIAGNOSIS — R293 Abnormal posture: Secondary | ICD-10-CM

## 2022-10-17 DIAGNOSIS — R2681 Unsteadiness on feet: Secondary | ICD-10-CM | POA: Diagnosis present

## 2022-10-17 DIAGNOSIS — R2689 Other abnormalities of gait and mobility: Secondary | ICD-10-CM | POA: Diagnosis present

## 2022-10-17 DIAGNOSIS — M6281 Muscle weakness (generalized): Secondary | ICD-10-CM | POA: Diagnosis present

## 2022-10-17 DIAGNOSIS — R269 Unspecified abnormalities of gait and mobility: Secondary | ICD-10-CM | POA: Diagnosis present

## 2022-10-17 NOTE — Telephone Encounter (Signed)
Requested Prescriptions   Signed Prescriptions Disp Refills   FLUoxetine (PROZAC) 10 MG capsule 90 capsule 1    Sig: TAKE ONE CAPSULE A DAY FOR ONE WEEK, THEN TAKE 2 CAPSULES A DAY FOR ONE WEEK, THEN TAKE 3 CAPSULES A DAY    Authorizing Provider: Suzzanne Cloud    Ordering User: Allean Found E   Pt last seen 08/06/22 by slack np. I realized when the error message was sent that the prescription didn't make it to the pharmacy, I decided to reevaluate. It looks like the provider discontinued the 10mg  and that the pt is only on the 40mg . I will route to the provider to make sure prior to deleting from the med list. Epic didn't show any prior dispenses of the 10mg  routing to provider to clarify.  Prior Dispenses of the 40mg :   Dispensed Days Supply Quantity Provider Pharmacy  FLUOXETINE HCL 40 MG CAPSULE 02/08/2022 90 90 each Suzzanne Cloud, NP [facility] #[id number] - [place]

## 2022-10-17 NOTE — Therapy (Signed)
OUTPATIENT PHYSICAL THERAPY NEURO TREATMENT   Patient Name: Shannon Travis MRN: [id number] DOB:[birth year], 37 y.o., female Today's Date: 10/17/2022   PCP: Junie Spencer PA-C [place] REFERRING PROVIDER: Alric Ran, MD  END OF SESSION:  PT End of Session - 10/17/22 0847     Visit Number 5    Number of Visits 8    Date for PT Re-Evaluation 11/19/22    Authorization Type Medicaid    Authorization Time Period 4 visits 10/17/22-11/13/22    Authorization - Visit Number 1    Authorization - Number of Visits 4    PT Start Time 0845    PT Stop Time 0928    PT Time Calculation (min) 43 min    Equipment Utilized During Treatment Gait belt    Activity Tolerance Patient tolerated treatment well    Behavior During Therapy [facility] for tasks assessed/performed;Anxious              Past Medical History:  Diagnosis Date   Abnormality of gait    Anxiety    CP (cerebral palsy) (HCC)    mild per mom   Movement disorder    Pre-diabetes    Seizures ([place])    Past Surgical History:  Procedure Laterality Date   CYSTECTOMY Left    leg   ORIF ANKLE FRACTURE Right 04/19/2022   Procedure: OPEN REDUCTION INTERNAL FIXATION (ORIF) right ANKLE FRACTURE, lateral malleolus;  Surgeon: Wylene Simmer, MD;  Location: [facility];  Service: Orthopedics;  Laterality: Right;   Patient Active Problem List   Diagnosis Date Noted   Fibula fracture 04/19/2022   B12 deficiency 01/18/2021   Weakness of both lower extremities 01/18/2021   Vitamin D deficiency 09/30/2020   Encounter for surveillance of contraceptive pills 12/17/2019   Mixed hyperlipidemia 12/17/2019   Family history of blood clots 11/25/2019   Osteopenia of multiple sites 11/24/2019   Arthralgia of right hip 11/16/2019   DDD (degenerative disc disease), lumbar 11/16/2019   Partial epilepsy with impairment of consciousness 12/03/2012   Abnormality of gait 12/03/2012   Essential and other specified forms of  tremor 12/03/2012   H/O mental disorder 05/15/2011   GAD (generalized anxiety disorder) 05/15/2011   Petit-mal epilepsy 05/15/2011   Cerebral palsy ([place]) 05/02/2011   Benign essential tremor 05/02/2011   Bilateral polycystic ovarian syndrome 02/28/2011    ONSET DATE: 08/06/2022  REFERRING DIAG:    G80.9 (ICD-10-CM) - Cerebral palsy, unspecified type ([place])    THERAPY DIAG:  Muscle weakness (generalized)  Unsteadiness on feet  Other abnormalities of gait and mobility  Abnormal posture  Rationale for Evaluation and Treatment: Rehabilitation  SUBJECTIVE:  SUBJECTIVE STATEMENT: Pt reports no falls or other acute changes since last visit. Pt with no complaints of pain this date. Pt reports she has been helping take the trashcans to/from the curb at home, driveway has some rocks in it and can be tricky to navigate.  Pt accompanied by: self, mom [name] (in lobby)  PERTINENT HISTORY: anxiety, CP, pre-diabetes, seizures, s/p ORIF ankle fracture 04/19/2022  PAIN:  Are you having pain? No  PRECAUTIONS: Fall and Other: seizure  WEIGHT BEARING RESTRICTIONS: No  FALLS: Has patient fallen in last 6 months? Yes. Number of falls 3; broke toes of R foot then broke R ankle  LIVING ENVIRONMENT: Lives with: lives with their family (lives with mom) Lives in: House/apartment Stairs: No Has following equipment at home: Lobbyist, Environmental consultant - 4 wheeled, and Ramped entry  PLOF: Independent with gait, Independent with transfers, and Requires assistive device for independence  PATIENT GOALS: "mostly to get my balance" "get over fear of falling and stepping off of curbs and uneven pavement"  OBJECTIVE:   DIAGNOSTIC FINDINGS:  R ankle xray 04/05/2022 FINDINGS: Mildly displaced oblique fracture is  seen involving the distal right fibula. The tibia is unremarkable.   IMPRESSION: Mildly displaced distal right fibular fracture.    COGNITION: Overall cognitive status: History of cognitive impairments - at baseline  TODAY'S TREATMENT:                                                                                                                                TherAct: In // bars with no UE support for focus on static standing balance on compliant surface of airex: 2 x 30 sec each in Romberg stance 2 x 30 sec each in L/R modified tandem stance Most difficulty with RLE back  Alt L/R gumdrop taps with gait with CGA, focus on decreased visual input and still finding target. Pt has 2 LOB but able to recover and prevent fall.  Added step-taps to HEP, see bolded below  Gait: Ascend/descend 2" step, 4" step, 6" step with CGA and use of "hurricane" x 4 reps each for focus on decreased anxiety with curb navigation, ascending with LLE and descending with RLE.   PATIENT EDUCATION: Education details: continue HEP and working on Associate Professor in home environment Person educated: Patient and Parent Education method: Customer service manager Education comprehension: verbalized understanding and needs further education  HOME EXERCISE PROGRAM: Access Code: [email or web address] URL: [email or web address] Date: 09/27/2022 Prepared by: Excell Seltzer  Exercises - Mini Squat with Counter Support  - 1 x daily - 7 x weekly - 3 sets - 10 reps - Sit to Stand with Resistance Around Legs  - 1 x daily - 7 x weekly - 3 sets - 10 reps - Alternating Step Taps with Counter Support  - 1 x daily - 7 x weekly - 3 sets - 10 reps  GOALS: Goals reviewed with patient? Yes  SHORT TERM GOALS=LONG  TERM GOALS due to length of POC   LONG TERM GOALS: Target date: 10/08/2022  Pt will be independent with final HEP for improved strength, balance, transfers and gait. Baseline: Reports that she is doing  initial exercises at home Goal status: IN PROGRESS  2.  Pt will improve gait velocity to at least 2.75 ft/sec for improved gait efficiency and performance at mod I level with LRAD Baseline: 2.58 ft/sec with rollator (2/26); improved to 3.03 ft/sec with SPC (SBA) Goal status: MET  3.  Pt will improve normal TUG to less than or equal to 20 seconds for improved functional mobility and decreased fall risk. Baseline: 24.56 sec with rollator (2/26); improved to 19.21 sec with SPC (SBA) Goal status: IN PROGRESS  4.  Pt will navigate up/down a curb with LRAD at mod I level for return to community mobility Baseline: CGA with SPC Goal status: IN PROGRESS  5.  Pt will ambulate x 500 ft outdoors across uneven ground and sidewalks with LRAD at mod I level for return to community mobility Baseline: Pt ambulates 500 ft  with SPC in doors, 1x trip but able to regain balance with stepping  Goal status: IN PROGRESS  NEW SHORT TERM GOALS=LONG TERM GOALS due to length of POC   NEW LONG TERM GOALS:  Target date: 11/13/2022  Pt will be independent with final HEP for improved strength, balance, transfers and gait. Baseline: Reports that she is doing initial exercises at home Goal status: IN PROGRESS  2.  Pt will navigate up/down a curb with LRAD at mod I level for return to community mobility Baseline: CGA with SPC Goal status: IN PROGRESS  3.  Pt will ambulate x 500 ft outdoors across uneven ground and sidewalks with LRAD at mod I level for return to community mobility Baseline: Pt ambulates 500 ft  with SPC in doors, 1x trip but able to regain balance with stepping  Goal status: IN PROGRESS  4.  Pt will improve gait velocity to at least 3.25 ft/sec for improved gait efficiency and performance at mod I level  Baseline: 2.58 ft/sec with rollator (2/26); improved to 3.03 ft/sec with SPC (SBA) Goal status: INITIAL    ASSESSMENT:  CLINICAL IMPRESSION: Emphasis of skilled PT session on working on  static standing balance on compliant surface, ascending/descending various height curbs, and working on balance in SLS during gait. Pt continues to exhibit anxiety regarding curb navigation due to fear of falling and benefits from continued practice with this to reduce anxiety and freezing. Pt continues to benefit from skilled therapy services to work towards [place]. Continue POC.   OBJECTIVE IMPAIRMENTS: Abnormal gait, decreased balance, decreased knowledge of condition, decreased mobility, difficulty walking, decreased strength, impaired perceived functional ability, and postural dysfunction.   ACTIVITY LIMITATIONS: carrying, lifting, bending, sitting, standing, stairs, and transfers  PARTICIPATION LIMITATIONS: community activity  PERSONAL FACTORS: Time since onset of injury/illness/exacerbation and 1-2 comorbidities:    anxiety, CP, pre-diabetes, seizures, s/p ORIF ankle fracture 10/5/2023are also affecting patient's functional outcome.   REHAB POTENTIAL: Good  CLINICAL DECISION MAKING: Stable/uncomplicated  EVALUATION COMPLEXITY: Low  PLAN:  PT FREQUENCY: 1x/week  PT DURATION: 4 weeks (additional to what already completed)  PLANNED INTERVENTIONS: Therapeutic exercises, Therapeutic activity, Neuromuscular re-education, Balance training, Gait training, Patient/Family education, Self Care, Joint mobilization, Stair training, DME instructions, Dry Needling, Electrical stimulation, Cryotherapy, Moist heat, Taping, Manual therapy, and Re-evaluation  PLAN FOR NEXT SESSION:  add to HEP for balance, endurance, LE strengthening (TKE, step-ups, sidesteps); work  on gait across uneven surfaces and navigating around obstacles with QC "hurricane", gait outdoors, up/down curbs with LRAD, wants to work towards being able to step on/off treadmill   Excell Seltzer, PT, DPT, CSRS 10/17/2022, 9:31 AM

## 2022-10-18 ENCOUNTER — Other Ambulatory Visit: Payer: Self-pay | Admitting: Neurology

## 2022-10-25 ENCOUNTER — Encounter: Payer: Self-pay | Admitting: Physical Therapy

## 2022-10-25 ENCOUNTER — Ambulatory Visit: Payer: Medicaid Other | Admitting: Physical Therapy

## 2022-10-25 VITALS — BP 103/66 | HR 81

## 2022-10-25 DIAGNOSIS — R2689 Other abnormalities of gait and mobility: Secondary | ICD-10-CM

## 2022-10-25 DIAGNOSIS — M6281 Muscle weakness (generalized): Secondary | ICD-10-CM | POA: Diagnosis not present

## 2022-10-25 DIAGNOSIS — R2681 Unsteadiness on feet: Secondary | ICD-10-CM

## 2022-10-25 DIAGNOSIS — R269 Unspecified abnormalities of gait and mobility: Secondary | ICD-10-CM

## 2022-10-25 NOTE — Therapy (Signed)
OUTPATIENT PHYSICAL THERAPY NEURO TREATMENT   Patient Name: Shannon Travis MRN: [id number] DOB:June 02, 1986, 37 y.o., female Today's Date: 10/25/2022   PCP: Ralene Ok PA-C [facility] REFERRING PROVIDER: Windell Norfolk, MD  END OF SESSION:  PT End of Session - 10/25/22 0848     Visit Number 6    Number of Visits 8    Date for PT Re-Evaluation 11/19/22    Authorization Type Medicaid    Authorization Time Period 4 visits 10/17/22-11/13/22    Authorization - Visit Number 2    Authorization - Number of Visits 4    PT Start Time 0847    PT Stop Time 0931    PT Time Calculation (min) 44 min    Equipment Utilized During Treatment Gait belt    Activity Tolerance Patient tolerated treatment well    Behavior During Therapy [facility] for tasks assessed/performed;Anxious              Past Medical History:  Diagnosis Date   Abnormality of gait    Anxiety    CP (cerebral palsy)    mild per mom   Movement disorder    Pre-diabetes    Seizures    Past Surgical History:  Procedure Laterality Date   CYSTECTOMY Left    leg   ORIF ANKLE FRACTURE Right 04/19/2022   Procedure: OPEN REDUCTION INTERNAL FIXATION (ORIF) right ANKLE FRACTURE, lateral malleolus;  Surgeon: Toni Arthurs, MD;  Location: [facility];  Service: Orthopedics;  Laterality: Right;   Patient Active Problem List   Diagnosis Date Noted   Fibula fracture 04/19/2022   B12 deficiency 01/18/2021   Weakness of both lower extremities 01/18/2021   Vitamin D deficiency 09/30/2020   Encounter for surveillance of contraceptive pills 12/17/2019   Mixed hyperlipidemia 12/17/2019   Family history of blood clots 11/25/2019   Osteopenia of multiple sites 11/24/2019   Arthralgia of right hip 11/16/2019   DDD (degenerative disc disease), lumbar 11/16/2019   Partial epilepsy with impairment of consciousness 12/03/2012   Abnormality of gait 12/03/2012   Essential and other specified forms of tremor  12/03/2012   H/O mental disorder 05/15/2011   GAD (generalized anxiety disorder) 05/15/2011   Petit-mal epilepsy 05/15/2011   Cerebral palsy (HCC) 05/02/2011   Benign essential tremor 05/02/2011   Bilateral polycystic ovarian syndrome 02/28/2011    ONSET DATE: 08/06/2022  REFERRING DIAG:    G80.9 (ICD-10-CM) - Cerebral palsy, unspecified type (HCC)    THERAPY DIAG:  Abnormality of gait and mobility  Muscle weakness (generalized)  Unsteadiness on feet  Other abnormalities of gait and mobility  Rationale for Evaluation and Treatment: Rehabilitation  SUBJECTIVE:  SUBJECTIVE STATEMENT: Pt reports no acute changes and no falls. Patient is hoping to work on navigating around turns as she feels more unsteady when trying to go around a bend. Patient reports that she has a harder time going over her L shoulder. Patient also reports wanting to work on her balance in today's session.   Pt accompanied by: self, mom [name] (in lobby)  PERTINENT HISTORY: anxiety, CP, pre-diabetes, seizures, s/p ORIF ankle fracture 04/19/2022  PAIN:  Are you having pain? No  PRECAUTIONS: Fall and Other: seizure  WEIGHT BEARING RESTRICTIONS: No  FALLS: Has patient fallen in last 6 months? Yes. Number of falls 3; broke toes of R foot then broke R ankle  LIVING ENVIRONMENT: Lives with: lives with their family (lives with mom) Lives in: House/apartment Stairs: No Has following equipment at home: Counselling psychologist, Environmental consultant - 4 wheeled, and Ramped entry  PLOF: Independent with gait, Independent with transfers, and Requires assistive device for independence  PATIENT GOALS: "mostly to get my balance" "get over fear of falling and stepping off of curbs and uneven pavement"  OBJECTIVE:   DIAGNOSTIC FINDINGS:  R  ankle xray 04/05/2022 FINDINGS: Mildly displaced oblique fracture is seen involving the distal right fibula. The tibia is unremarkable.   IMPRESSION: Mildly displaced distal right fibular fracture.    COGNITION: Overall cognitive status: History of cognitive impairments - at baseline  TODAY'S TREATMENT:                                                                                                                               TherAct: Obstacle course navigation with SPC working on navigation over 3 by 6" hurdles, large black foam block, up ramp, down curb x 3 rounds (CGA) Curb navigation up with right down with SPC left mixed practice throughout session x 6 (CGA)  Gait: 2 laps clockwise by 2 laps counterclockwise with hurrycane working on navigation around turns  [employer] for navigation around curves 2 x 8 cone with hurrycane (SBA-CGA) Cone weaving for navigation around curves 2 x 8 cone with hurrycane working on visual scanning out naming the cone in front Data processing manager)  PATIENT EDUCATION: Education details: continue HEP  Person educated: Patient and Parent Education method: Medical illustrator Education comprehension: verbalized understanding and needs further education  HOME EXERCISE PROGRAM: Access Code: [email or web address] URL: [email or web address] Date: 09/27/2022 Prepared by: Peter Congo  Exercises - Mini Squat with Counter Support  - 1 x daily - 7 x weekly - 3 sets - 10 reps - Sit to Stand with Resistance Around Legs  - 1 x daily - 7 x weekly - 3 sets - 10 reps - Alternating Step Taps with Counter Support  - 1 x daily - 7 x weekly - 3 sets - 10 reps  GOALS: Goals reviewed with patient? Yes  SHORT TERM GOALS=LONG TERM GOALS due to length of POC   LONG TERM GOALS: Target date: 10/08/2022  Pt  will be independent with final HEP for improved strength, balance, transfers and gait. Baseline: Reports that she is doing initial exercises at home Goal  status: IN PROGRESS  2.  Pt will improve gait velocity to at least 2.75 ft/sec for improved gait efficiency and performance at mod I level with LRAD Baseline: 2.58 ft/sec with rollator (2/26); improved to 3.03 ft/sec with SPC (SBA) Goal status: MET  3.  Pt will improve normal TUG to less than or equal to 20 seconds for improved functional mobility and decreased fall risk. Baseline: 24.56 sec with rollator (2/26); improved to 19.21 sec with SPC (SBA) Goal status: IN PROGRESS  4.  Pt will navigate up/down a curb with LRAD at mod I level for return to community mobility Baseline: CGA with SPC Goal status: IN PROGRESS  5.  Pt will ambulate x 500 ft outdoors across uneven ground and sidewalks with LRAD at mod I level for return to community mobility Baseline: Pt ambulates 500 ft  with SPC in doors, 1x trip but able to regain balance with stepping  Goal status: IN PROGRESS  NEW SHORT TERM GOALS=LONG TERM GOALS due to length of POC   NEW LONG TERM GOALS:  Target date: 11/13/2022  Pt will be independent with final HEP for improved strength, balance, transfers and gait. Baseline: Reports that she is doing initial exercises at home Goal status: IN PROGRESS  2.  Pt will navigate up/down a curb with LRAD at mod I level for return to community mobility Baseline: CGA with SPC Goal status: IN PROGRESS  3.  Pt will ambulate x 500 ft outdoors across uneven ground and sidewalks with LRAD at mod I level for return to community mobility Baseline: Pt ambulates 500 ft  with SPC in doors, 1x trip but able to regain balance with stepping  Goal status: IN PROGRESS  4.  Pt will improve gait velocity to at least 3.25 ft/sec for improved gait efficiency and performance at mod I level  Baseline: 2.58 ft/sec with rollator (2/26); improved to 3.03 ft/sec with SPC (SBA) Goal status: INITIAL    ASSESSMENT:  CLINICAL IMPRESSION: Session focused on continuing to build confidence on curb navigation, step  overs, and balance around turns. Patient requires pauses before navigating step overs and down curbs due to increased anxiety around tasks but demonstrates decreased knee buckles throughout session and increased stability when actually performing the tasks. Pt continues to benefit from skilled therapy services to work towards LTGs. Continue POC.   OBJECTIVE IMPAIRMENTS: Abnormal gait, decreased balance, decreased knowledge of condition, decreased mobility, difficulty walking, decreased strength, impaired perceived functional ability, and postural dysfunction.   ACTIVITY LIMITATIONS: carrying, lifting, bending, sitting, standing, stairs, and transfers  PARTICIPATION LIMITATIONS: community activity  PERSONAL FACTORS: Time since onset of injury/illness/exacerbation and 1-2 comorbidities:    anxiety, CP, pre-diabetes, seizures, s/p ORIF ankle fracture 10/5/2023are also affecting patient's functional outcome.   REHAB POTENTIAL: Good  CLINICAL DECISION MAKING: Stable/uncomplicated  EVALUATION COMPLEXITY: Low  PLAN:  PT FREQUENCY: 1x/week  PT DURATION: 4 weeks (additional to what already completed)  PLANNED INTERVENTIONS: Therapeutic exercises, Therapeutic activity, Neuromuscular re-education, Balance training, Gait training, Patient/Family education, Self Care, Joint mobilization, Stair training, DME instructions, Dry Needling, Electrical stimulation, Cryotherapy, Moist heat, Taping, Manual therapy, and Re-evaluation  PLAN FOR NEXT SESSION:  add to HEP for balance, endurance, LE strengthening (TKE, step-ups, sidesteps); work on gait across uneven surfaces and navigating around obstacles with QC "hurricane", up/down curbs with LRAD, wants to work towards being  able to step on/off treadmill, outside gait as weather allows   Carmelia BakeSarah A Annelisa Ryback, PT, DPT 10/25/2022, 9:50 AM

## 2022-10-29 ENCOUNTER — Ambulatory Visit: Payer: Medicaid Other | Admitting: Physical Therapy

## 2022-10-29 DIAGNOSIS — R2681 Unsteadiness on feet: Secondary | ICD-10-CM

## 2022-10-29 DIAGNOSIS — M6281 Muscle weakness (generalized): Secondary | ICD-10-CM | POA: Diagnosis not present

## 2022-10-29 DIAGNOSIS — R2689 Other abnormalities of gait and mobility: Secondary | ICD-10-CM

## 2022-10-29 DIAGNOSIS — R269 Unspecified abnormalities of gait and mobility: Secondary | ICD-10-CM

## 2022-10-29 DIAGNOSIS — R293 Abnormal posture: Secondary | ICD-10-CM

## 2022-10-29 NOTE — Therapy (Signed)
OUTPATIENT PHYSICAL THERAPY NEURO TREATMENT   Patient Name: Shannon Travis MRN: [id number] DOB:[birth year], 37 y.o., female Today's Date: 10/29/2022   PCP: Ralene Ok PA-C [facility] REFERRING PROVIDER: Windell Norfolk, MD  END OF SESSION:  PT End of Session - 10/29/22 0801     Visit Number 7    Number of Visits 8    Date for PT Re-Evaluation 11/19/22    Authorization Type Medicaid    Authorization Time Period 4 visits 10/17/22-11/13/22    Authorization - Visit Number 3    Authorization - Number of Visits 4    PT Start Time 0800    PT Stop Time 0845    PT Time Calculation (min) 45 min    Equipment Utilized During Treatment Gait belt    Activity Tolerance Patient tolerated treatment well    Behavior During Therapy [facility] for tasks assessed/performed;Anxious               Past Medical History:  Diagnosis Date   Abnormality of gait    Anxiety    CP (cerebral palsy)    mild per mom   Movement disorder    Pre-diabetes    Seizures    Past Surgical History:  Procedure Laterality Date   CYSTECTOMY Left    leg   ORIF ANKLE FRACTURE Right 04/19/2022   Procedure: OPEN REDUCTION INTERNAL FIXATION (ORIF) right ANKLE FRACTURE, lateral malleolus;  Surgeon: Toni Arthurs, MD;  Location: [facility];  Service: Orthopedics;  Laterality: Right;   Patient Active Problem List   Diagnosis Date Noted   Fibula fracture 04/19/2022   B12 deficiency 01/18/2021   Weakness of both lower extremities 01/18/2021   Vitamin D deficiency 09/30/2020   Encounter for surveillance of contraceptive pills 12/17/2019   Mixed hyperlipidemia 12/17/2019   Family history of blood clots 11/25/2019   Osteopenia of multiple sites 11/24/2019   Arthralgia of right hip 11/16/2019   DDD (degenerative disc disease), lumbar 11/16/2019   Partial epilepsy with impairment of consciousness 12/03/2012   Abnormality of gait 12/03/2012   Essential and other specified forms of tremor  12/03/2012   H/O mental disorder 05/15/2011   GAD (generalized anxiety disorder) 05/15/2011   Petit-mal epilepsy 05/15/2011   Cerebral palsy (HCC) 05/02/2011   Benign essential tremor 05/02/2011   Bilateral polycystic ovarian syndrome 02/28/2011    ONSET DATE: 08/06/2022  REFERRING DIAG:    G80.9 (ICD-10-CM) - Cerebral palsy, unspecified type (HCC)    THERAPY DIAG:  Abnormality of gait and mobility  Muscle weakness (generalized)  Unsteadiness on feet  Other abnormalities of gait and mobility  Abnormal posture  Rationale for Evaluation and Treatment: Rehabilitation  SUBJECTIVE:  SUBJECTIVE STATEMENT: Pt reports she is doing okay this morning, no acute changes since last visit. No falls and no pain today.  Pt accompanied by: self, mom Shannon Travis (in lobby)  PERTINENT HISTORY: anxiety, CP, pre-diabetes, seizures, s/p ORIF ankle fracture 04/19/2022  PAIN:  Are you having pain? No  PRECAUTIONS: Fall and Other: seizure  WEIGHT BEARING RESTRICTIONS: No  FALLS: Has patient fallen in last 6 months? Yes. Number of falls 3; broke toes of R foot then broke R ankle  LIVING ENVIRONMENT: Lives with: lives with their family (lives with mom) Lives in: House/apartment Stairs: No Has following equipment at home: Counselling psychologist, Environmental consultant - 4 wheeled, and Ramped entry  PLOF: Independent with gait, Independent with transfers, and Requires assistive device for independence  PATIENT GOALS: "mostly to get my balance" "get over fear of falling and stepping off of curbs and uneven pavement"  OBJECTIVE:   DIAGNOSTIC FINDINGS:  R ankle xray 04/05/2022 FINDINGS: Mildly displaced oblique fracture is seen involving the distal right fibula. The tibia is unremarkable.   IMPRESSION: Mildly displaced  distal right fibular fracture.    COGNITION: Overall cognitive status: History of cognitive impairments - at baseline  TODAY'S TREATMENT:                                                                                                                               TherAct: Ambulation through obstacle course with hurrycane and CGA across uneven mat on floor with alt L/R gumdrop taps and foam beam step-overs. Pt with some pausing before stepping over foam beam. Ambulation with various height foam beam step-overs with hurrycane and CGA. One instance of LOB, pt able to recover with min A. Pt needs a short, standing rest break after LOB to recover but then able to continue task with no more instances of LOB. Lateral sidesteps over various height foam beams 2 x 10 ft R/L with hurrycane and CGA, no LOB  Gait: Gait outdoors across uneven sidewalk, up/down inclines, and up/down curbs with close SBA with use of hurrycane. Pt with ongoing anxiety regarding navigating up/down curbs with no evidence of imbalance noted and improved performance noted when pt distracted.  PATIENT EDUCATION: Education details: continue HEP, PT POC (plan to d/c after next session)  Person educated: Patient and Parent Education method: Medical illustrator Education comprehension: verbalized understanding and needs further education  HOME EXERCISE PROGRAM: Access Code: [email or web address] URL: [email or web address] Date: 09/27/2022 Prepared by: Peter Congo  Exercises - Mini Squat with Counter Support  - 1 x daily - 7 x weekly - 3 sets - 10 reps - Sit to Stand with Resistance Around Legs  - 1 x daily - 7 x weekly - 3 sets - 10 reps - Alternating Step Taps with Counter Support  - 1 x daily - 7 x weekly - 3 sets - 10 reps  Keep working on ascending/descending curbs in the community with LRAD to decrease anxiety  GOALS: Goals reviewed with patient? Yes  SHORT TERM GOALS=LONG TERM GOALS due to length of  POC   LONG TERM GOALS: Target date: 10/08/2022  Pt will be independent with final HEP for improved strength, balance, transfers and gait. Baseline: Reports that she is doing initial exercises at home Goal status: IN PROGRESS  2.  Pt will improve gait velocity to at least 2.75 ft/sec for improved gait efficiency and performance at mod I level with LRAD Baseline: 2.58 ft/sec with rollator (2/26); improved to 3.03 ft/sec with SPC (SBA) Goal status: MET  3.  Pt will improve normal TUG to less than or equal to 20 seconds for improved functional mobility and decreased fall risk. Baseline: 24.56 sec with rollator (2/26); improved to 19.21 sec with SPC (SBA) Goal status: IN PROGRESS  4.  Pt will navigate up/down a curb with LRAD at mod I level for return to community mobility Baseline: CGA with SPC Goal status: IN PROGRESS  5.  Pt will ambulate x 500 ft outdoors across uneven ground and sidewalks with LRAD at mod I level for return to community mobility Baseline: Pt ambulates 500 ft  with SPC in doors, 1x trip but able to regain balance with stepping  Goal status: IN PROGRESS  NEW SHORT TERM GOALS=LONG TERM GOALS due to length of POC   NEW LONG TERM GOALS:  Target date: 11/13/2022  Pt will be independent with final HEP for improved strength, balance, transfers and gait. Baseline: Reports that she is doing initial exercises at home Goal status: IN PROGRESS  2.  Pt will navigate up/down a curb with LRAD at mod I level for return to community mobility Baseline: CGA with SPC Goal status: IN PROGRESS  3.  Pt will ambulate x 500 ft outdoors across uneven ground and sidewalks with LRAD at mod I level for return to community mobility Baseline: Pt ambulates 500 ft  with SPC in doors, 1x trip but able to regain balance with stepping  Goal status: IN PROGRESS  4.  Pt will improve gait velocity to at least 3.25 ft/sec for improved gait efficiency and performance at mod I level  Baseline: 2.58  ft/sec with rollator (2/26); improved to 3.03 ft/sec with SPC (SBA) Goal status: INITIAL    ASSESSMENT:  CLINICAL IMPRESSION: Emphasis of skilled PT session on continuing to work on gait outdoors in a functional environment navigating up/down inclines, across uneven surfaces, and up/down curbs. Pt continues to exhibit anxiety regarding curb navigation and has to "pause" before initiating curb navigation. Pt does exhibit increased independence with gait in a functional environment with progression to only needing SBA this session. Pt even has one instance of LOB while navigating over obstacles in clinic but is able to recover with min A and just needs a short standing break before resuming task. Plan to d/c from OPPT services after next session and pt to continue with her HEP and working on curb navigation at home and in community environment. Continue POC.    OBJECTIVE IMPAIRMENTS: Abnormal gait, decreased balance, decreased knowledge of condition, decreased mobility, difficulty walking, decreased strength, impaired perceived functional ability, and postural dysfunction.   ACTIVITY LIMITATIONS: carrying, lifting, bending, sitting, standing, stairs, and transfers  PARTICIPATION LIMITATIONS: community activity  PERSONAL FACTORS: Time since onset of injury/illness/exacerbation and 1-2 comorbidities:    anxiety, CP, pre-diabetes, seizures, s/p ORIF ankle fracture 10/5/2023are also affecting patient's functional outcome.   REHAB POTENTIAL: Good  CLINICAL DECISION MAKING: Stable/uncomplicated  EVALUATION COMPLEXITY: Low  PLAN:  PT FREQUENCY: 1x/week  PT DURATION: 4 weeks (additional to what already completed)  PLANNED INTERVENTIONS: Therapeutic exercises, Therapeutic activity, Neuromuscular re-education, Balance training, Gait training, Patient/Family education, Self Care, Joint mobilization, Stair training, DME instructions, Dry Needling, Electrical stimulation, Cryotherapy, Moist heat,  Taping, Manual therapy, and Re-evaluation  PLAN FOR NEXT SESSION: assess LTG and d/c from PT! (Pt and mom both aware of this plan)   Peter Congo, PT, DPT, CSRS 10/29/2022, 8:46 AM

## 2022-10-30 ENCOUNTER — Other Ambulatory Visit: Payer: Self-pay | Admitting: Neurology

## 2022-10-31 NOTE — Telephone Encounter (Signed)
Requested Prescriptions   Pending Prescriptions Disp Refills   FLUoxetine (PROZAC) 40 MG capsule [Pharmacy Med Name: FLUOXETINE HCL 40 MG CAPSULE] 90 capsule 3    Sig: TAKE 1 CAPSULE (40 MG TOTAL) BY MOUTH DAILY.   Patient was last seen at [facility] on 08/06/22 by Dr. Teresa Coombs, upcoming appt scheduled 02/26/23 with him as well. She was taking the prozac  in the last note. Refill granted

## 2022-11-01 ENCOUNTER — Telehealth: Payer: Self-pay | Admitting: Neurology

## 2022-11-01 NOTE — Telephone Encounter (Signed)
Pt called needing to speak to the RN regarding her FLUoxetine (PROZAC) 40 MG capsule a She states that a  was sent in but then a  was also sent in. Please advise.

## 2022-11-05 ENCOUNTER — Encounter: Payer: Self-pay | Admitting: Physical Therapy

## 2022-11-05 ENCOUNTER — Ambulatory Visit: Payer: Medicaid Other | Admitting: Physical Therapy

## 2022-11-05 VITALS — BP 123/71 | HR 85

## 2022-11-05 DIAGNOSIS — M6281 Muscle weakness (generalized): Secondary | ICD-10-CM | POA: Diagnosis not present

## 2022-11-05 DIAGNOSIS — R2689 Other abnormalities of gait and mobility: Secondary | ICD-10-CM

## 2022-11-05 DIAGNOSIS — R2681 Unsteadiness on feet: Secondary | ICD-10-CM

## 2022-11-05 DIAGNOSIS — R269 Unspecified abnormalities of gait and mobility: Secondary | ICD-10-CM

## 2022-11-05 NOTE — Therapy (Signed)
OUTPATIENT PHYSICAL THERAPY NEURO TREATMENT / DISCHARGE   Patient Name: Shannon Travis MRN: [id number] DOB:1985/10/02, 37 y.o., female Today's Date: 11/05/2022   PCP: Ralene Ok PA-C [facility] REFERRING PROVIDER: Windell Norfolk, MD  END OF SESSION:  PT End of Session - 11/05/22 0759     Visit Number 8    Number of Visits 8    Date for PT Re-Evaluation 11/19/22    Authorization Type Medicaid    Authorization Time Period 4 visits 10/17/22-11/13/22    Authorization - Visit Number 4    Authorization - Number of Visits 4    PT Start Time 0758    PT Stop Time 0837    PT Time Calculation (min) 39 min    Equipment Utilized During Treatment Gait belt    Activity Tolerance Patient tolerated treatment well    Behavior During Therapy [facility] for tasks assessed/performed;Anxious               Past Medical History:  Diagnosis Date   Abnormality of gait    Anxiety    CP (cerebral palsy)    mild per mom   Movement disorder    Pre-diabetes    Seizures    Past Surgical History:  Procedure Laterality Date   CYSTECTOMY Left    leg   ORIF ANKLE FRACTURE Right 04/19/2022   Procedure: OPEN REDUCTION INTERNAL FIXATION (ORIF) right ANKLE FRACTURE, lateral malleolus;  Surgeon: Toni Arthurs, MD;  Location: [facility];  Service: Orthopedics;  Laterality: Right;   Patient Active Problem List   Diagnosis Date Noted   Fibula fracture 04/19/2022   B12 deficiency 01/18/2021   Weakness of both lower extremities 01/18/2021   Vitamin D deficiency 09/30/2020   Encounter for surveillance of contraceptive pills 12/17/2019   Mixed hyperlipidemia 12/17/2019   Family history of blood clots 11/25/2019   Osteopenia of multiple sites 11/24/2019   Arthralgia of right hip 11/16/2019   DDD (degenerative disc disease), lumbar 11/16/2019   Partial epilepsy with impairment of consciousness 12/03/2012   Abnormality of gait 12/03/2012   Essential and other specified forms of  tremor 12/03/2012   H/O mental disorder 05/15/2011   GAD (generalized anxiety disorder) 05/15/2011   Petit-mal epilepsy 05/15/2011   Cerebral palsy (HCC) 05/02/2011   Benign essential tremor 05/02/2011   Bilateral polycystic ovarian syndrome 02/28/2011    ONSET DATE: 08/06/2022  REFERRING DIAG:    G80.9 (ICD-10-CM) - Cerebral palsy, unspecified type (HCC)    THERAPY DIAG:  Abnormality of gait and mobility  Muscle weakness (generalized)  Unsteadiness on feet  Other abnormalities of gait and mobility  Rationale for Evaluation and Treatment: Rehabilitation  SUBJECTIVE:  SUBJECTIVE STATEMENT: Pt reports that she is doing well. She denies falls/near falls.   Pt accompanied by: self, mom [name] (in lobby)  PERTINENT HISTORY: anxiety, CP, pre-diabetes, seizures, s/p ORIF ankle fracture 04/19/2022  PAIN:  Are you having pain? No  PRECAUTIONS: Fall and Other: seizure  WEIGHT BEARING RESTRICTIONS: No  FALLS: Has patient fallen in last 6 months? Yes. Number of falls 3; broke toes of R foot then broke R ankle  LIVING ENVIRONMENT: Lives with: lives with their family (lives with mom) Lives in: House/apartment Stairs: No Has following equipment at home: Counselling psychologist, Environmental consultant - 4 wheeled, and Ramped entry  PLOF: Independent with gait, Independent with transfers, and Requires assistive device for independence  PATIENT GOALS: "mostly to get my balance" "get over fear of falling and stepping off of curbs and uneven pavement"  OBJECTIVE:   DIAGNOSTIC FINDINGS:  R ankle xray 04/05/2022 FINDINGS: Mildly displaced oblique fracture is seen involving the distal right fibula. The tibia is unremarkable.   IMPRESSION: Mildly displaced distal right fibular fracture.    COGNITION: Overall  cognitive status: History of cognitive impairments - at baseline  TODAY'S TREATMENT:                                                                                                                               TherAct (Assessed goals/interpreted to patient): Ambulation outsdie x 700 feet over uneven surface/ up and down ramps, curb ascent/descent x 2 with modified independene with SPC no LOB or signs of imbalance   [facility] PT Assessment - 11/05/22 0001       Ambulation/Gait   Gait velocity 3.28 ft/sec   with SPC (modI)     Timed Up and Go Test   TUG Normal TUG    Normal TUG (seconds) 10.46   SPC (modI)            EC balance 3 x 30" (SBA) in // bars with NBOS (Patient requesting to assess) - no LOB    PATIENT EDUCATION: Education details: continue HEP + return to PT if notice decline, education on progress/improvements Person educated: Patient and Parent Education method: Medical illustrator Education comprehension: verbalized understanding  HOME EXERCISE PROGRAM: Access Code: [email or web address] URL: [email or web address] Date: 09/27/2022 Prepared by: Peter Congo  Exercises - Mini Squat with Counter Support  - 1 x daily - 7 x weekly - 3 sets - 10 reps - Sit to Stand with Resistance Around Legs  - 1 x daily - 7 x weekly - 3 sets - 10 reps - Alternating Step Taps with Counter Support  - 1 x daily - 7 x weekly - 3 sets - 10 reps  Keep working on ascending/descending curbs in the community with LRAD to decrease anxiety   GOALS: Goals reviewed with patient? Yes  SHORT TERM GOALS=LONG TERM GOALS due to length of POC   LONG TERM GOALS: Target date: 10/08/2022  Pt will be independent with  final HEP for improved strength, balance, transfers and gait. Baseline: Reports that she is doing initial exercises at home; reports confidence in HEP Goal status: MET  2.  Pt will improve gait velocity to at least 2.75 ft/sec for improved gait efficiency and performance  at mod I level with LRAD Baseline: 2.58 ft/sec with rollator (2/26); improved to 3.03 ft/sec with SPC (SBA) Goal status: MET  3.  Pt will improve normal TUG to less than or equal to 20 seconds for improved functional mobility and decreased fall risk. Baseline: 24.56 sec with rollator (2/26); improved to 19.21 sec with SPC (SBA); 10.46 with SPC modI  Goal status: MET  4.  Pt will navigate up/down a curb with LRAD at mod I level for return to community mobility Baseline: CGA with SPC; modI with SPC Goal status: MET  5.  Pt will ambulate x 500 ft outdoors across uneven ground and sidewalks with LRAD at mod I level for return to community mobility Baseline: Pt ambulates 500 ft  with SPC in doors, 1x trip but able to regain balance with stepping; 700 ft with SPC no LOB modified independent Goal status: MET  NEW SHORT TERM GOALS=LONG TERM GOALS due to length of POC   NEW LONG TERM GOALS:  Target date: 11/13/2022  Pt will be independent with final HEP for improved strength, balance, transfers and gait. Baseline: Reports that she is doing initial exercises at home; reports confidence in HEP Goal status: MET  2.  Pt will navigate up/down a curb with LRAD at mod I level for return to community mobility Baseline: CGA with SPC; mod I with SPC Goal status: MET  3.  Pt will ambulate x 500 ft outdoors across uneven ground and sidewalks with LRAD at mod I level for return to community mobility Baseline: Pt ambulates 500 ft  with SPC in doors, 1x trip but able to regain balance with stepping; 700 ft with SPC no LOB modified independent Goal status: MET  4.  Pt will improve gait velocity to at least 3.25 ft/sec for improved gait efficiency and performance at mod I level  Baseline: 2.58 ft/sec with rollator (2/26); improved to 3.03 ft/sec with SPC (SBA); improved to 3.28 ft/sec with SPC Goal status: MET   ASSESSMENT:  CLINICAL IMPRESSION:  Patient is discharging form physical therapy services  at this this time due to maximal rehab potential and achieving all remaining LTGs. Patient was modI was SPC outdoors across complex environment with no LOB up and down curbs and ramps. Patient no longer at an increased risk for falls as indicated by TUG and is an Microbiologist as indicated by gait speed. Patient reports that she is amazed by her progress and very proud of how far she has come. Patient discharging with HEP and understanding of when to return to therapy if notices a decline. No further services indicated at this time.  OBJECTIVE IMPAIRMENTS: Abnormal gait, decreased balance, decreased knowledge of condition, decreased mobility, difficulty walking, decreased strength, impaired perceived functional ability, and postural dysfunction.   ACTIVITY LIMITATIONS: carrying, lifting, bending, sitting, standing, stairs, and transfers  PARTICIPATION LIMITATIONS: community activity  PERSONAL FACTORS: Time since onset of injury/illness/exacerbation and 1-2 comorbidities:    anxiety, CP, pre-diabetes, seizures, s/p ORIF ankle fracture 10/5/2023are also affecting patient's functional outcome.   REHAB POTENTIAL: Good  CLINICAL DECISION MAKING: Stable/uncomplicated  EVALUATION COMPLEXITY: Low  PLAN:  PT FREQUENCY: 1x/week  PT DURATION: 4 weeks (additional to what already completed)  PLANNED INTERVENTIONS:  Therapeutic exercises, Therapeutic activity, Neuromuscular re-education, Balance training, Gait training, Patient/Family education, Self Care, Joint mobilization, Stair training, DME instructions, Dry Needling, Electrical stimulation, Cryotherapy, Moist heat, Taping, Manual therapy, and Re-evaluation  PLAN FOR NEXT SESSION: not indicated - D/C   Carmelia Bake, PT, DPT 11/05/2022, 8:52 AM  PHYSICAL THERAPY DISCHARGE SUMMARY  Visits from Start of Care: 8  Current functional level related to goals / functional outcomes: Achieved all LTGs   Remaining deficits: Met all  LTGs   Education / Equipment: Return to therapy if notice decline, continue HEP   Patient agrees to discharge. Patient goals were met. Patient is being discharged due to meeting the stated rehab goals.

## 2023-02-26 ENCOUNTER — Encounter: Payer: Self-pay | Admitting: Neurology

## 2023-02-26 ENCOUNTER — Ambulatory Visit: Payer: MEDICAID | Admitting: Neurology

## 2023-02-26 VITALS — BP 126/75 | HR 88 | Ht 67.0 in | Wt 292.5 lb

## 2023-02-26 DIAGNOSIS — R269 Unspecified abnormalities of gait and mobility: Secondary | ICD-10-CM | POA: Diagnosis not present

## 2023-02-26 DIAGNOSIS — G809 Cerebral palsy, unspecified: Secondary | ICD-10-CM

## 2023-02-26 DIAGNOSIS — G801 Spastic diplegic cerebral palsy: Secondary | ICD-10-CM | POA: Diagnosis not present

## 2023-02-26 DIAGNOSIS — G40209 Localization-related (focal) (partial) symptomatic epilepsy and epileptic syndromes with complex partial seizures, not intractable, without status epilepticus: Secondary | ICD-10-CM | POA: Diagnosis not present

## 2023-02-26 MED ORDER — PROPRANOLOL HCL 10 MG PO TABS: 10.0000 mg | ORAL_TABLET | Freq: Two times a day (BID) | ORAL | 0 refills | Status: DC

## 2023-02-26 MED ORDER — TOPIRAMATE 100 MG PO TABS: 100.0000 mg | ORAL_TABLET | Freq: Every day | ORAL | 3 refills | Status: DC

## 2023-02-26 NOTE — Progress Notes (Signed)
PATIENT: Shannon Travis DOB: 1986-04-19  REASON FOR VISIT: Follow up for cerebral palsy, gait disorder, seizures HISTORY FROM: Patient and her mother PRIMARY NEUROLOGIST: Dr. Teresa Coombs since Dr. Anne Hahn retired   HISTORY OF PRESENT ILLNESS: Today 02/26/23 [name] presents today for follow-up, last visit was in January, since then she has been doing well.  She completed physical therapy and reports improvement in her gait.  Denies any falls.  She still use a cane with ambulation.  In terms of the tremor, she reports a trial of propranolol was helpful.  Denies any seizure or seizure-like activity since last visit.  She is compliant with her medications, denies any side effects.  Overall she is doing well.   INTERVAL HISTORY 08/06/2022 [name] presents today for follow-up, she is accompanied by her mother.  Since last visit in July she has not had any additional seizures but continues to have falls.  Outside of the house she no longer uses a cane, and now she has a rolling walker.  She reported last month she fell and broke her ankle, she is set up to start physical therapy next week.  Her last fall was last Thursday.  She denies any dizziness, denies any lightheadedness before the fall, she reports her legs gave out.  Denies any concussion or loss of consciousness with the falls, denies any seizures prior to the fall.  She is compliant with the seizure medications.  She still struggling with anxiety and tremors, when she is very nervous she has shaking of the bilateral upper extremities.  She reported the fluoxetine has helped but has not completely resolved her symptoms.   INTERVAL HISTORY 01/31/2022:  Patient presents today for follow-up, she is accompanied by her mother.  Last visit was in January, since then she has not had any additional seizures.  She said that her last seizure was more than a year ago.  She is compliant with the Keppra 1000 mg twice daily.  She is also on fluoxetine 40 mg  daily and she reported it helped with her anxiety.  She still having anxiety attack while at church singing and sometimes when crossing the road, denies any falls.  Doing well, last seizure was more than a year    INTERVAL HISTORY 08/02/21 [name] is here today with follow-up history of cerebral palsy, gait disorder, seizures.  On Keppra.  At last visit Prozac was added to help with anxiety, generalized tremor events during pain or being emotionally upset. Claims took for some time the titration, then stopped. Doesn't remember if it helped. Anxiety continues, especially with ambulation in public places. Completed PT, it helped with gait, has a rolling walker now, uses as needed. Mostly uses during times of known anxiety with ambulation. No seizures (described as staring spells). Taking metformin to help with weight loss. No falls. Tremor in both hands, more in the right, mild in the head. Doing well, here today with her mom.   HISTORY  01/18/2021 Dr. Anne Hahn: 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  The patient has staring episodes as her typical seizures.  The patient however has also had generalized tremor events unassociated with loss of consciousness that are usually associated with pain or being emotionally upset.  The patient had a fall on 19 December 2020 when she was going out to [employer].  The patient injured her right leg, x-rays were all negative.  The patient has  recovered from this.  During that emergency room visit, when she was upset due to the pain she had another typical generalized shaking event unassociated with loss of consciousness.  The typical staring events that represent her seizures have not been observed by her mother in many years.  The patient does not operate a motor vehicle currently.  She is followed through psychiatry for her anxiety, but she has not been on any medications for anxiety.  The  patient remains on Keppra 1000 mg twice daily, it is not clear whether or not the Keppra dosing has worsened the anxiety.  The patient appears to have a lot of fear of falling, she will freeze up frequently when she has to go up on the curb or go into an elevator.  She was to have physical therapy on her last visit but this never occurred.  The patient uses a cane for ambulation.  She does fall with some regularity.  REVIEW OF SYSTEMS: Out of a complete 14 system review of symptoms, the patient complains only of the following symptoms, and all other reviewed systems are negative.  See HPI  ALLERGIES: No Known Allergies  HOME MEDICATIONS: Outpatient Medications Prior to Visit  Medication Sig Dispense Refill   Cholecalciferol (VITAMIN D3 GUMMIES ADULT PO) Take 2 tablets by mouth every Sunday.     FLUoxetine (PROZAC) 40 MG capsule TAKE 1 CAPSULE (40 MG TOTAL) BY MOUTH DAILY. 90 capsule 3   levETIRAcetam (KEPPRA) 1000 MG tablet Take 1 tablet (1,000 mg total) by mouth 2 (two) times daily. 180 tablet 3   norethindrone (MICRONOR) 0.35 MG tablet Take 1 tablet by mouth in the morning.     Oyster Shell (OYSTER CALCIUM) 500 MG TABS tablet Take 500 mg of elemental calcium by mouth daily.     propranolol (INDERAL) 10 MG tablet Take 1 tablet (10 mg total) by mouth 2 (two) times daily for 15 days. 30 tablet 0   rivaroxaban (XARELTO) 10 MG TABS tablet Take 1 tablet (10 mg total) by mouth daily for 14 days. 14 tablet 0   topiramate (TOPAMAX) 25 MG tablet Take 100 mg by mouth in the morning.     No facility-administered medications prior to visit.    PAST MEDICAL HISTORY: Past Medical History:  Diagnosis Date   Abnormality of gait    Anxiety    CP (cerebral palsy) (HCC)    mild per mom   Movement disorder    Pre-diabetes    Seizures (HCC)     PAST SURGICAL HISTORY: Past Surgical History:  Procedure Laterality Date   CYSTECTOMY Left    leg   ORIF ANKLE FRACTURE Right 04/19/2022   Procedure:  OPEN REDUCTION INTERNAL FIXATION (ORIF) right ANKLE FRACTURE, lateral malleolus;  Surgeon: Toni Arthurs, MD;  Location: [facility];  Service: Orthopedics;  Laterality: Right;    FAMILY HISTORY: Family History  Problem Relation Age of Onset   Healthy Mother     SOCIAL HISTORY: Social History   Socioeconomic History   Marital status: Single    Spouse name: Not on file   Number of children: 0   Years of education: 12   Highest education level: Not on file  Occupational History   Not on file  Tobacco Use   Smoking status: Never   Smokeless tobacco: Never  Substance and Sexual Activity   Alcohol use: No   Drug use: No   Sexual activity: Not on file  Other Topics Concern   Not  on file  Social History Narrative   Patient lives at home with her mother Hollie Beach.    Patient is currently working part time.    Patient is single.    Patient has no children.    Patient has high school education.    Social Determinants of Health   Financial Resource Strain: Low Risk  (11/24/2022)   Received from [facility]   Overall Financial Resource Strain (CARDIA)    Difficulty of Paying Living Expenses: Not hard at all  Food Insecurity: No Food Insecurity (11/24/2022)   Received from [facility]   Hunger Vital Sign    Worried About Running Out of Food in the Last Year: Never true    Ran Out of Food in the Last Year: Never true  Transportation Needs: No Transportation Needs (11/24/2022)   Received from [facility] - Transportation    Lack of Transportation (Medical): No    Lack of Transportation (Non-Medical): No  Physical Activity: Unknown (11/24/2022)   Received from [facility]   Exercise Vital Sign    Days of Exercise per Week: Patient declined    Minutes of Exercise per Session: 20 min  Stress: No Stress Concern Present (11/24/2022)   Received from [facility] - Occupational Stress Questionnaire     Feeling of Stress : Only a little  Social Connections: Socially Integrated (11/24/2022)   Received from [facility]   Social Network    How would you rate your social network (family, work, friends)?: Good participation with social networks  Intimate Partner Violence: Not At Risk (11/24/2022)   Received from [facility]   HITS    Over the last 12 months how often did your partner physically hurt you?: 1    Over the last 12 months how often did your partner insult you or talk down to you?: 1    Over the last 12 months how often did your partner threaten you with physical harm?: 1    Over the last 12 months how often did your partner scream or curse at you?: 1   PHYSICAL EXAM  Vitals:   02/26/23 0944  BP: 126/75  Pulse: 88  Weight: 292 lb 8 oz (132.7 kg)  Height: 5\' 7"  (1.702 m)    Body mass index is 45.81 kg/m.  Generalized: Well developed, in no acute distress   Neurological examination  Mentation: Alert oriented to time, place, history taking. Follows all commands speech and language fluent Cranial nerve II-XII: Pupils were equal round reactive to light. Extraocular movements were full, visual field were full on confrontational test. Facial sensation and strength were normal. Head turning and shoulder shrug  were normal and symmetric. Motor: The motor testing reveals 5 over 5 strength of all 4 extremities. Good symmetric motor tone is noted throughout.  Sensory: Sensory testing is intact to soft touch on all 4 extremities. No evidence of extinction is noted.  Coordination: Cerebellar testing reveals good finger-nose-finger but she has enhance physiological tremors. Athetoid movements of head and arms noted. Gait and station: Gait is slightly wide-based, mildly spastic gait, uses a walker   DIAGNOSTIC DATA (LABS, IMAGING, TESTING) - I reviewed patient records, labs, notes, testing and imaging myself where available.  Lab Results  Component Value Date   WBC 6.9 04/19/2022    HGB 14.0 04/19/2022   HCT 41.8 04/19/2022   MCV 91.7 04/19/2022   PLT 323 04/19/2022  Component Value Date/Time   CREATININE 0.81 05/03/2022 0455   GFRNONAA >60 05/03/2022 0455   No results found for: "CHOL", "HDL", "LDLCALC", "LDLDIRECT", "TRIG", "CHOLHDL" No results found for: "HGBA1C" No results found for: "VITAMINB12" No results found for: "TSH"  ASSESSMENT AND PLAN 37 y.o. year old female  has a past medical history of Abnormality of gait, Anxiety, CP (cerebral palsy) (HCC), Movement disorder, Pre-diabetes, and Seizures (HCC). here with:  1.  Cerebral palsy, athetoid   2.  Gait disorder   3.  Anxiety disorder   4.  Seizure disorder, under good control  -Seraphina is doing well today, Continue Keppra 1000 mg twice daily and Topiramate 100 mg daily - Continue with Fluoxetine 40 mg daily -Propanolol 10 twice daily for one month, patient to contact us and let us if medication was helpful, if so, we will add a yearly prescription. -Follow-up in 1 year    Windell Norfolk, MD 02/26/2023, 10:06 AM [facility] [address], [address] [place], [place] [address] [phone]

## 2023-03-01 ENCOUNTER — Encounter: Payer: Self-pay | Admitting: Neurology

## 2023-03-04 ENCOUNTER — Telehealth: Payer: Self-pay

## 2023-03-04 NOTE — Telephone Encounter (Signed)
Pts mother called and stated that topirimate was ready for pick up at the pharmacy but that it was'nt discussed during appt and phar,d mentioned maked birth control ineffective. Is this a must have? She wanted me to let you know she already is on keppra is it necessary for both.   Routing to provider they requested this wait specifically for Dr. Teresa Coombs return.

## 2023-03-07 ENCOUNTER — Other Ambulatory Visit: Payer: Self-pay | Admitting: Neurology

## 2023-03-07 NOTE — Telephone Encounter (Signed)
Please call and advise patient/mother this was unfortunately a mistake. She should be on Keppra alone.  Thank you

## 2023-03-22 ENCOUNTER — Other Ambulatory Visit: Payer: Self-pay | Admitting: Neurology

## 2023-05-04 ENCOUNTER — Other Ambulatory Visit: Payer: Self-pay | Admitting: Neurology

## 2023-05-06 NOTE — Telephone Encounter (Signed)
I wanted her to contact us to let us know if the propanolol was helpful. Can you please follow up with her. Thanks

## 2023-05-06 NOTE — Telephone Encounter (Signed)
Was only given for 15 days... did you want her to continue?

## 2023-05-07 NOTE — Telephone Encounter (Signed)
Called and spoke to pt mother who stated the pt phone is broken but that the pt seems to be doing ok w/propanalol.

## 2023-10-03 ENCOUNTER — Encounter: Payer: Self-pay | Admitting: Physical Therapy

## 2023-10-03 ENCOUNTER — Ambulatory Visit: Payer: MEDICAID | Attending: Physician Assistant | Admitting: Physical Therapy

## 2023-10-03 ENCOUNTER — Other Ambulatory Visit: Payer: Self-pay

## 2023-10-03 VITALS — BP 104/83 | HR 80

## 2023-10-03 DIAGNOSIS — M6281 Muscle weakness (generalized): Secondary | ICD-10-CM | POA: Diagnosis present

## 2023-10-03 DIAGNOSIS — R278 Other lack of coordination: Secondary | ICD-10-CM | POA: Diagnosis present

## 2023-10-03 DIAGNOSIS — R269 Unspecified abnormalities of gait and mobility: Secondary | ICD-10-CM | POA: Diagnosis present

## 2023-10-03 DIAGNOSIS — M5459 Other low back pain: Secondary | ICD-10-CM | POA: Insufficient documentation

## 2023-10-03 DIAGNOSIS — R2681 Unsteadiness on feet: Secondary | ICD-10-CM | POA: Insufficient documentation

## 2023-10-03 DIAGNOSIS — R2689 Other abnormalities of gait and mobility: Secondary | ICD-10-CM | POA: Diagnosis present

## 2023-10-03 DIAGNOSIS — R293 Abnormal posture: Secondary | ICD-10-CM | POA: Diagnosis present

## 2023-10-03 NOTE — Therapy (Signed)
 OUTPATIENT PHYSICAL THERAPY NEURO EVALUATION   Patient Name: Shannon Travis MRN: [id number] DOB:10-29-1985, 38 y.o., female Today's Date: 10/03/2023   PCP: Sharon Seller, NP but switching to another provider in [facility] REFERRING PROVIDER: Jordan Hawks, PA-C  END OF SESSION:  PT End of Session - 10/03/23 0802     Visit Number 1    Number of Visits 7    Date for PT Re-Evaluation 11/07/23    Authorization Type [employer]    PT Start Time 0800    PT Stop Time [phone]    PT Time Calculation (min) 43 min    Equipment Utilized During Treatment Gait belt    Activity Tolerance Patient tolerated treatment well    Behavior During Therapy WFL for tasks assessed/performed             Past Medical History:  Diagnosis Date   Abnormality of gait    Anxiety    CP (cerebral palsy) (HCC)    mild per mom   Movement disorder    Pre-diabetes    Seizures (HCC)    Past Surgical History:  Procedure Laterality Date   CYSTECTOMY Left    leg   ORIF ANKLE FRACTURE Right 04/19/2022   Procedure: OPEN REDUCTION INTERNAL FIXATION (ORIF) right ANKLE FRACTURE, lateral malleolus;  Surgeon: Toni Arthurs, MD;  Location: [facility];  Service: Orthopedics;  Laterality: Right;   Patient Active Problem List   Diagnosis Date Noted   Fibula fracture 04/19/2022   B12 deficiency 01/18/2021   Weakness of both lower extremities 01/18/2021   Vitamin D deficiency 09/30/2020   Encounter for surveillance of contraceptive pills 12/17/2019   Mixed hyperlipidemia 12/17/2019   Family history of blood clots 11/25/2019   Osteopenia of multiple sites 11/24/2019   Arthralgia of right hip 11/16/2019   DDD (degenerative disc disease), lumbar 11/16/2019   Partial epilepsy with impairment of consciousness (HCC) 12/03/2012   Abnormality of gait 12/03/2012   Essential and other specified forms of tremor 12/03/2012   H/O mental disorder 05/15/2011   GAD (generalized anxiety disorder)  05/15/2011   Petit-mal epilepsy (HCC) 05/15/2011   Cerebral palsy (HCC) 05/02/2011   Benign essential tremor 05/02/2011   Bilateral polycystic ovarian syndrome 02/28/2011    ONSET DATE: 08/19/2023 (referral date)  REFERRING DIAG:  G80.2 (ICD-10-CM) - Spastic hemiplegic cerebral palsy (HCC) M54.50,G89.29 (ICD-10-CM) - Chronic low back pain R29.898 (ICD-10-CM) - Leg weakness   THERAPY DIAG:  Other abnormalities of gait and mobility - Plan: PT plan of care cert/re-cert  Muscle weakness (generalized) - Plan: PT plan of care cert/re-cert  Unsteadiness on feet - Plan: PT plan of care cert/re-cert  Abnormality of gait and mobility - Plan: PT plan of care cert/re-cert  Other low back pain - Plan: PT plan of care cert/re-cert  Rationale for Evaluation and Treatment: Rehabilitation  SUBJECTIVE:  SUBJECTIVE STATEMENT:  Patient arrives to session ambulating with hurrycane. Patient reports since she was last here, her legs still lock up intermittently. She reports that she is still trying to work on stepping down off the curbs. Patient also reports that she is having intermittent back pain when she is getting up out of bed. Patient reports two episodes of lightheadness when patient was coming out of shower, which resulted in two falls but she was able to get to the couch to her herself up. The second time, her mother was home and able to get her up. She reports that the doctor was unsure what caused these episodes of lightheadedness. Patient reports that these episodes happened around late January. Patient mother reports that she took patient's BP at this time and the readings weren't too elevated but only slightly.    Pt accompanied by: self, mom Shannon Travis  PERTINENT HISTORY: anxiety, CP, pre-diabetes, seizures,  s/p ORIF ankle fracture 04/19/2022  PAIN:  Are you having pain? Yes: NPRS scale: 4/10 Pain location: low back pain Pain description: grabbing/cramping feeling Aggravating factors: sitting for a long time Relieving factors: moving helps.   PRECAUTIONS: Fall and Other: seizure  WEIGHT BEARING RESTRICTIONS: No  FALLS: Has patient fallen in last 6 months? Yes. Number of falls 2 falls with episodes of lightheadedness  LIVING ENVIRONMENT: Lives with: lives with their family (lives with mom) Lives in: House/apartment Stairs: No Has following equipment at home: Environmental consultant - 4 wheeled, Ramped entry, and hurrycane  PLOF: Independent with gait, Independent with transfers, and Requires assistive device for independence  PATIENT GOALS: "being able to conquer my fear of crossing street, going up and down curbs, and no hesitating without pause."   OBJECTIVE:   DIAGNOSTIC FINDINGS:   MR Brain wo Contrast (08/28/2022): IMPRESSION: Unremarkable MRI scan of the brain with and without contrast   COGNITION: Overall cognitive status: History of cognitive impairments - at baseline   SENSATION: WFL  COORDINATION: slighty decreased in BLE, dysmetria  POSTURE: rounded shoulders, forward head, and posterior pelvic tilt  LOWER EXTREMITY ROM:     Active  Right Eval Left Eval  Hip flexion    Hip extension    Hip abduction    Hip adduction    Hip internal rotation    Hip external rotation    Knee flexion    Knee extension hyperextension hyperextension  Ankle dorsiflexion    Ankle plantarflexion    Ankle inversion    Ankle eversion     (Blank rows = not tested)  LOWER EXTREMITY MMT:    MMT Right Eval Left Eval  Hip flexion 5 5  Hip extension    Hip abduction    Hip adduction    Hip internal rotation    Hip external rotation    Knee flexion 4 5  Knee extension 4 5  Ankle dorsiflexion 4 4  Ankle plantarflexion    Ankle inversion    Ankle eversion    (Blank rows = not  tested)  BED MOBILITY:  Independent per pt report but does have pain when sitting up in bed  TRANSFERS: Assistive device utilized:  hurrycane   Sit to stand: Modified independence Stand to sit: Modified independence Chair to chair: Modified independence Floor:  not assessed at eval  GAIT:  Gait pattern: genu recurvatum- Right, genu recurvatum- Left, and wide BOS Distance walked: 115 ft Assistive device utilized: Quad cane small base "hurricane" Level of assistance: Modified independence Comments: some dyscordination but overall safe  FUNCTIONAL TESTS:    [facility] PT Assessment - 10/03/23 0001       Standardized Balance Assessment   Standardized Balance Assessment 10 meter walk test    10 Meter Walk 3.43 ft/sec   with hurrycane            SUBJECTIVE TESTS:   Modified Oswestry Disability Index: 18/50 = 36% impairment (sitting most challenging for more than an hour)  TODAY'S TREATMENT:                                                                                                                               NMR: Seated PWR! Up and PWR! Rock for low back pain and postural re-education x10 reps mirroring therapist, patient reporting reduction in pain and good stretch, trialed twist but not super safe and mild discomfort due to discordination deficits so did not add to HEP  PATIENT EDUCATION: Education details: Eval findings, PT POC, initial HEP  Person educated: Patient and Parent Education method: Medical illustrator Education comprehension: verbalized understanding and needs further education  HOME EXERCISE PROGRAM: Seated PWR! Up and PWR! Rock 2 x 15 reps daily and as needed   GOALS: Goals reviewed with patient? Yes  SHORT TERM GOALS=LONG TERM GOALS due to length of POC   LONG TERM GOALS: Target date: 11/07/2023 (STG = LTG due to POC length)  Pt will be independent with final HEP for improved low back pain management, strength, balance, transfers and  gait. Baseline: Provided on 3/20 Goal status: INITIAL  2.  Patient will improve modified ODI score to 18% impairment or less indicate a clinically important improvement in low back pain.   Baseline: 18/50 = 36% impairment Goal status: INITIAL  3.  to be assessed / LTG written Baseline: To be assessed  Goal status: INITIAL   ASSESSMENT:  CLINICAL IMPRESSION: Patient is a 38 year old female known to this clinic referred to Neuro OPPT for gait impairments and low back pain due to her CP.  Pt's PMH is significant for: anxiety, CP, pre-diabetes, seizures, s/p ORIF ankle fracture 04/19/2022 as well as 2 recent episodes of lightheadness in January 2025 resulting in falls. The following deficits were present during the exam: decreased balance with reliance on AD, decreased functional LE strength, gait deviations, and low back pain. Pt would benefit from skilled PT to address these impairments and functional limitations to maximize functional mobility independence.   OBJECTIVE IMPAIRMENTS: Abnormal gait, decreased balance, decreased knowledge of condition, decreased mobility, difficulty walking, decreased strength, impaired perceived functional ability, and postural dysfunction.   ACTIVITY LIMITATIONS: carrying, lifting, bending, sitting, standing, stairs, and transfers  PARTICIPATION LIMITATIONS: community activity  PERSONAL FACTORS: Time since onset of injury/illness/exacerbation and 1-2 comorbidities:    anxiety, CP, pre-diabetes, seizures, s/p ORIF ankle fracture 10/5/2023are also affecting patient's functional outcome.   REHAB POTENTIAL: Good  CLINICAL DECISION MAKING: Stable/uncomplicated  EVALUATION COMPLEXITY: Low  PLAN:  PT FREQUENCY: 2x/week  PT DURATION: 3 weeks  PLANNED INTERVENTIONS: Therapeutic exercises, Therapeutic activity, Neuromuscular re-education, Balance training, Gait training, Patient/Family education, Self Care, Joint mobilization, Stair training, DME  instructions, Dry Needling, Electrical stimulation, Cryotherapy, Moist heat, Taping, Manual therapy, and Re-evaluation  PLAN FOR NEXT SESSION:  Assess curbs as indicated, assess and write LTG, fall recovery, watch for episodes of lightheadedness and assess BP as indicated, work on managing back pain with mix of flexion and extension as tolerated    Carmelia Bake, PT, DPT 10/03/2023, 9:04 AM

## 2023-10-07 ENCOUNTER — Ambulatory Visit: Payer: MEDICAID | Admitting: Physical Therapy

## 2023-10-07 ENCOUNTER — Encounter: Payer: Self-pay | Admitting: Physical Therapy

## 2023-10-07 VITALS — BP 111/71 | HR 75

## 2023-10-07 DIAGNOSIS — R2689 Other abnormalities of gait and mobility: Secondary | ICD-10-CM | POA: Diagnosis not present

## 2023-10-07 DIAGNOSIS — R269 Unspecified abnormalities of gait and mobility: Secondary | ICD-10-CM

## 2023-10-07 DIAGNOSIS — R2681 Unsteadiness on feet: Secondary | ICD-10-CM

## 2023-10-07 DIAGNOSIS — M5459 Other low back pain: Secondary | ICD-10-CM

## 2023-10-07 DIAGNOSIS — M6281 Muscle weakness (generalized): Secondary | ICD-10-CM

## 2023-10-07 NOTE — Therapy (Signed)
 OUTPATIENT PHYSICAL THERAPY NEURO TREATMENT   Patient Name: Shannon Travis MRN: [id number] DOB:1986-03-21, 38 y.o., female Today's Date: 10/07/2023  PCP: Sharon Seller, NP but switching to another provider in [facility] REFERRING PROVIDER: Jordan Hawks, PA-C  END OF SESSION:  PT End of Session - 10/07/23 0803     Visit Number 2    Number of Visits 7    Date for PT Re-Evaluation 11/07/23    Authorization Type [employer]    PT Start Time 0801    PT Stop Time 0842    PT Time Calculation (min) 41 min    Equipment Utilized During Treatment Gait belt    Activity Tolerance Patient tolerated treatment well    Behavior During Therapy WFL for tasks assessed/performed             Past Medical History:  Diagnosis Date   Abnormality of gait    Anxiety    CP (cerebral palsy) (HCC)    mild per mom   Movement disorder    Pre-diabetes    Seizures (HCC)    Past Surgical History:  Procedure Laterality Date   CYSTECTOMY Left    leg   ORIF ANKLE FRACTURE Right 04/19/2022   Procedure: OPEN REDUCTION INTERNAL FIXATION (ORIF) right ANKLE FRACTURE, lateral malleolus;  Surgeon: Toni Arthurs, MD;  Location: [facility];  Service: Orthopedics;  Laterality: Right;   Patient Active Problem List   Diagnosis Date Noted   Fibula fracture 04/19/2022   B12 deficiency 01/18/2021   Weakness of both lower extremities 01/18/2021   Vitamin D deficiency 09/30/2020   Encounter for surveillance of contraceptive pills 12/17/2019   Mixed hyperlipidemia 12/17/2019   Family history of blood clots 11/25/2019   Osteopenia of multiple sites 11/24/2019   Arthralgia of right hip 11/16/2019   DDD (degenerative disc disease), lumbar 11/16/2019   Partial epilepsy with impairment of consciousness (HCC) 12/03/2012   Abnormality of gait 12/03/2012   Essential and other specified forms of tremor 12/03/2012   H/O mental disorder 05/15/2011   GAD (generalized anxiety disorder)  05/15/2011   Petit-mal epilepsy (HCC) 05/15/2011   Cerebral palsy (HCC) 05/02/2011   Benign essential tremor 05/02/2011   Bilateral polycystic ovarian syndrome 02/28/2011    ONSET DATE: 08/19/2023 (referral date)  REFERRING DIAG:  G80.2 (ICD-10-CM) - Spastic hemiplegic cerebral palsy (HCC) M54.50,G89.29 (ICD-10-CM) - Chronic low back pain R29.898 (ICD-10-CM) - Leg weakness   THERAPY DIAG:  Other abnormalities of gait and mobility  Muscle weakness (generalized)  Unsteadiness on feet  Abnormality of gait and mobility  Other low back pain  Rationale for Evaluation and Treatment: Rehabilitation  SUBJECTIVE:  SUBJECTIVE STATEMENT:  Patient reports that she feels like her back is doing better overall. She has been working on her exercises and feel like they are helping some. She wants to work more on her walking today as she feels like her back is getting better. Denies falls and near falls since last here.    Pt accompanied by: self, mom Angela  PERTINENT HISTORY: anxiety, CP, pre-diabetes, seizures, s/p ORIF ankle fracture 04/19/2022  PAIN:  Are you having pain? Yes: NPRS scale: 5/10 Pain location: low back pain Pain description: grabbing/cramping feeling Aggravating factors: sitting for a long time Relieving factors: moving helps.   PRECAUTIONS: Fall and Other: seizure  WEIGHT BEARING RESTRICTIONS: No  FALLS: Has patient fallen in last 6 months? Yes. Number of falls 2 falls with episodes of lightheadedness  LIVING ENVIRONMENT: Lives with: lives with their family (lives with mom) Lives in: House/apartment Stairs: No Has following equipment at home: Environmental consultant - 4 wheeled, Ramped entry, and hurrycane  PLOF: Independent with gait, Independent with transfers, and Requires assistive device  for independence  PATIENT GOALS: "being able to conquer my fear of crossing street, going up and down curbs, and no hesitating without pause."   OBJECTIVE:   DIAGNOSTIC FINDINGS:   MR Brain wo Contrast (08/28/2022): IMPRESSION: Unremarkable MRI scan of the brain with and without contrast   COGNITION: Overall cognitive status: History of cognitive impairments - at baseline   TODAY'S TREATMENT:                                                                                                                               Self Care: Vitals:   10/07/23 0810  BP: 111/71  Pulse: 75   Seated on RUE Patient with questions on pain medication for pain; PT deferred to PCP for management and patient verbalized understanding Patient with questions about   TherAct:   [facility] PT Assessment - 10/07/23 0001       6 minute walk test results    Aerobic Endurance Distance Walked 1281   feet with hurrycane (modI)   Endurance additional comments RPE: 7/10 reports reduction in LBP from 5/10 to 2/10 with walking           Lifting technique for hip hinging 2 x 10 reps - dead lift form at [facility] Trialed sumo squats but not tolerated due to knee valgus and pain x 5 reps Mini squat standing with cues to keep knees in neutral 2 x 10 with EOM behind for transfer technique  Updated HEP as indicated   NMR: Teach back method with review of PWR! Mos for pain management, dynamic stability with sitting Seated PWR! Up - patient initially kicking into extensor response, educated on keeping feet planted when sitting up to minimize LBP and for core stability 2 x 10 Seated PWR! Rock - educate on reaching up instead of to side to maximize stretch x 10  PATIENT EDUCATION: Education details: Additions to  HEP Person educated: Patient and Parent Education method: Medical illustrator Education comprehension: verbalized understanding and needs further education  HOME EXERCISE PROGRAM: Seated PWR! Up and PWR!  Rock 2 x 15 reps daily and as needed   Access Code: [email or web address] URL: [email or web address] Date: 10/07/2023 Prepared by: Maryruth Eve  Exercises - [place] Deadlift- Hands on Hips  - 1 x daily - 7 x weekly - 3 sets - 10 reps - Mini Squat  - 1 x daily - 7 x weekly - 3 sets - 10 reps  GOALS: Goals reviewed with patient? Yes  SHORT TERM GOALS = LONG TERM GOALS due to length of POC   LONG TERM GOALS: Target date: 11/07/2023 (STG = LTG due to POC length)  Pt will be independent with final HEP for improved low back pain management, strength, balance, transfers and gait. Baseline: Provided on 3/20 Goal status: INITIAL  2.  Patient will improve modified ODI score to 18% impairment or less indicate a clinically important improvement in low back pain.   Baseline: 18/50 = 36% impairment Goal status: INITIAL  3.  to be assessed / LTG written Baseline: To be assessed  Goal status: D/C anticipate near functional baseline and    ASSESSMENT:  CLINICAL IMPRESSION: Emphasis on skilled physical therapy on assessment on , review of prior HEP, and addition of proximal stability to HEP for balance and functional strength for pain management. Patient was efficient and effective with reduction in pain, further emphasizing need for regular exercise program for pain management. Patient with a few reports of knee pain likely due to baseline valgus with mini squat but improved with cueing for positioning. Continue POC.    OBJECTIVE IMPAIRMENTS: Abnormal gait, decreased balance, decreased knowledge of condition, decreased mobility, difficulty walking, decreased strength, impaired perceived functional ability, and postural dysfunction.   ACTIVITY LIMITATIONS: carrying, lifting, bending, sitting, standing, stairs, and transfers  PARTICIPATION LIMITATIONS: community activity  PERSONAL FACTORS: Time since onset of injury/illness/exacerbation and 1-2 comorbidities:    anxiety, CP,  pre-diabetes, seizures, s/p ORIF ankle fracture 10/5/2023are also affecting patient's functional outcome.   REHAB POTENTIAL: Good  CLINICAL DECISION MAKING: Stable/uncomplicated  EVALUATION COMPLEXITY: Low  PLAN:  PT FREQUENCY: 2x/week  PT DURATION: 3 weeks  PLANNED INTERVENTIONS: Therapeutic exercises, Therapeutic activity, Neuromuscular re-education, Balance training, Gait training, Patient/Family education, Self Care, Joint mobilization, Stair training, DME instructions, Dry Needling, Electrical stimulation, Cryotherapy, Moist heat, Taping, Manual therapy, and Re-evaluation  PLAN FOR NEXT SESSION:  Assess curbs as indicated, fall recovery, watch for episodes of lightheadedness and assess BP as indicated, work on managing back pain with mix of flexion and extension as tolerated, progress dead lifts and squats with weights, work on modified SLS tasks and confidence with curbs as indicated    Carmelia Bake, PT, DPT 10/07/2023, 12:09 PM

## 2023-10-09 ENCOUNTER — Ambulatory Visit: Payer: MEDICAID | Admitting: Physical Therapy

## 2023-10-09 DIAGNOSIS — M6281 Muscle weakness (generalized): Secondary | ICD-10-CM

## 2023-10-09 DIAGNOSIS — R2689 Other abnormalities of gait and mobility: Secondary | ICD-10-CM

## 2023-10-09 DIAGNOSIS — M5459 Other low back pain: Secondary | ICD-10-CM

## 2023-10-09 DIAGNOSIS — R269 Unspecified abnormalities of gait and mobility: Secondary | ICD-10-CM

## 2023-10-09 DIAGNOSIS — R278 Other lack of coordination: Secondary | ICD-10-CM

## 2023-10-09 DIAGNOSIS — R293 Abnormal posture: Secondary | ICD-10-CM

## 2023-10-09 DIAGNOSIS — R2681 Unsteadiness on feet: Secondary | ICD-10-CM

## 2023-10-09 NOTE — Therapy (Signed)
 OUTPATIENT PHYSICAL THERAPY NEURO TREATMENT   Patient Name: Shannon Travis MRN: [id number] DOB:09/29/1985, 38 y.o., female Today's Date: 10/09/2023  PCP: Sharon Seller, NP but switching to another provider in [facility] [place] REFERRING PROVIDER: Jordan Hawks, PA-C  END OF SESSION:  PT End of Session - 10/09/23 0758     Visit Number 3    Number of Visits 7    Date for PT Re-Evaluation 11/07/23    Authorization Type [employer]    PT Start Time 0800    PT Stop Time 0840    PT Time Calculation (min) 40 min    Equipment Utilized During Treatment Gait belt    Activity Tolerance Patient tolerated treatment well    Behavior During Therapy WFL for tasks assessed/performed              Past Medical History:  Diagnosis Date   Abnormality of gait    Anxiety    CP (cerebral palsy) (HCC)    mild per mom   Movement disorder    Pre-diabetes    Seizures (HCC)    Past Surgical History:  Procedure Laterality Date   CYSTECTOMY Left    leg   ORIF ANKLE FRACTURE Right 04/19/2022   Procedure: OPEN REDUCTION INTERNAL FIXATION (ORIF) right ANKLE FRACTURE, lateral malleolus;  Surgeon: Toni Arthurs, MD;  Location: [facility];  Service: Orthopedics;  Laterality: Right;   Patient Active Problem List   Diagnosis Date Noted   Fibula fracture 04/19/2022   B12 deficiency 01/18/2021   Weakness of both lower extremities 01/18/2021   Vitamin D deficiency 09/30/2020   Encounter for surveillance of contraceptive pills 12/17/2019   Mixed hyperlipidemia 12/17/2019   Family history of blood clots 11/25/2019   Osteopenia of multiple sites 11/24/2019   Arthralgia of right hip 11/16/2019   DDD (degenerative disc disease), lumbar 11/16/2019   Partial epilepsy with impairment of consciousness (HCC) 12/03/2012   Abnormality of gait 12/03/2012   Essential and other specified forms of tremor 12/03/2012   H/O mental disorder 05/15/2011   GAD (generalized anxiety disorder)  05/15/2011   Petit-mal epilepsy (HCC) 05/15/2011   Cerebral palsy (HCC) 05/02/2011   Benign essential tremor 05/02/2011   Bilateral polycystic ovarian syndrome 02/28/2011    ONSET DATE: 08/19/2023 (referral date)  REFERRING DIAG:  G80.2 (ICD-10-CM) - Spastic hemiplegic cerebral palsy (HCC) M54.50,G89.29 (ICD-10-CM) - Chronic low back pain R29.898 (ICD-10-CM) - Leg weakness   THERAPY DIAG:  Other abnormalities of gait and mobility  Muscle weakness (generalized)  Unsteadiness on feet  Abnormality of gait and mobility  Other low back pain  Abnormal posture  Other lack of coordination  Rationale for Evaluation and Treatment: Rehabilitation  SUBJECTIVE:  SUBJECTIVE STATEMENT: Pt states her back has been alright. Has been compliant to her exercises. Has been walking daily but states "not that far"  Pt accompanied by: self, mom Angela  PERTINENT HISTORY: anxiety, CP, pre-diabetes, seizures, s/p ORIF ankle fracture 04/19/2022  PAIN:  Are you having pain? Yes: NPRS scale: 5/10 Pain location: low back pain Pain description: grabbing/cramping feeling Aggravating factors: sitting for a long time Relieving factors: moving helps.   PRECAUTIONS: Fall and Other: seizure  WEIGHT BEARING RESTRICTIONS: No  FALLS: Has patient fallen in last 6 months? Yes. Number of falls 2 falls with episodes of lightheadedness  LIVING ENVIRONMENT: Lives with: lives with their family (lives with mom) Lives in: House/apartment Stairs: No Has following equipment at home: Environmental consultant - 4 wheeled, Ramped entry, and hurrycane  PLOF: Independent with gait, Independent with transfers, and Requires assistive device for independence  PATIENT GOALS: "being able to conquer my fear of crossing street, going up and down curbs,  and no hesitating without pause."   OBJECTIVE:   DIAGNOSTIC FINDINGS:   MR Brain wo Contrast (08/28/2022): IMPRESSION: Unremarkable MRI scan of the brain with and without contrast   COGNITION: Overall cognitive status: History of cognitive impairments - at baseline   TODAY'S TREATMENT:                                                                                                                               Self Care: At rest on clinic entry: 97/77; 83 BPM After 6 min of amb: 109/83; 94 BPM Seated on RUE  TherAct: Amb 2x3 min for warm up (back pain lowered from 5 to 4) Seated PWR! Rock - educate on reaching up instead of to side to maximize stretch x 10 Seated PWR! Up - x10 Seated hip hinge x5 Hip hinge into Sit<>stand with yellow TB around knees x10 (pt could feel it in her back) Mini squat with yellow TB around knees x10 Hip abd yellow TB around knees 2x10 Standing donkey kicks 2x10  Therex: "L" stretch x30 sec with lateral flexion x30 sec Seated figure 4 stretch x30 sec Seated piriformis stretch x30 sec   PATIENT EDUCATION: Education details: Additions to HEP Person educated: Patient and Parent Education method: Medical illustrator Education comprehension: verbalized understanding and needs further education  HOME EXERCISE PROGRAM: Seated PWR! Up and PWR! Rock 2 x 15 reps daily and as needed  Access Code: [email or web address] URL: [email or web address] Date: 10/09/2023 Prepared by: Vernon Prey April Kirstie Peri  Exercises - [place] Deadlift- Hands on Hips  - 1 x daily - 7 x weekly - 3 sets - 10 reps - Mini Squat  - 1 x daily - 7 x weekly - 3 sets - 10 reps - Standing Hip Extension with Leg Bent and Support  - 1 x daily - 7 x weekly - 3 sets - 10 reps - Hip Abduction with Resistance Loop  - 1 x daily - 7 x weekly -  3 sets - 10 reps  GOALS: Goals reviewed with patient? Yes  SHORT TERM GOALS = LONG TERM GOALS due to length of POC   LONG TERM  GOALS: Target date: 11/07/2023 (STG = LTG due to POC length)  Pt will be independent with final HEP for improved low back pain management, strength, balance, transfers and gait. Baseline: Provided on 3/20 Goal status: INITIAL  2.  Patient will improve modified ODI score to 18% impairment or less indicate a clinically important improvement in low back pain.   Baseline: 18/50 = 36% impairment (eval) Goal status: INITIAL  3.  to be assessed / LTG written Baseline: 1281 with hurrycane Goal status: D/C anticipate near functional baseline    ASSESSMENT:  CLINICAL IMPRESSION: Continued to work on proximal stability and hip strengthening for improved LE stability, balance and gait for curbs and community mobility. Provided stretching for hips and back. Worked on increasing single leg stability and weight bearing. Attempted to progress mini squats to sit<>stands to progress towards a full squat; however, pt reported feeling it in her back.   OBJECTIVE IMPAIRMENTS: Abnormal gait, decreased balance, decreased knowledge of condition, decreased mobility, difficulty walking, decreased strength, impaired perceived functional ability, and postural dysfunction.   ACTIVITY LIMITATIONS: carrying, lifting, bending, sitting, standing, stairs, and transfers  PARTICIPATION LIMITATIONS: community activity  PERSONAL FACTORS: Time since onset of injury/illness/exacerbation and 1-2 comorbidities:    anxiety, CP, pre-diabetes, seizures, s/p ORIF ankle fracture 10/5/2023are also affecting patient's functional outcome.   REHAB POTENTIAL: Good  CLINICAL DECISION MAKING: Stable/uncomplicated  EVALUATION COMPLEXITY: Low  PLAN:  PT FREQUENCY: 2x/week  PT DURATION: 3 weeks  PLANNED INTERVENTIONS: Therapeutic exercises, Therapeutic activity, Neuromuscular re-education, Balance training, Gait training, Patient/Family education, Self Care, Joint mobilization, Stair training, DME instructions, Dry Needling,  Electrical stimulation, Cryotherapy, Moist heat, Taping, Manual therapy, and Re-evaluation  PLAN FOR NEXT SESSION: Assess curbs as indicated, fall recovery, watch for episodes of lightheadedness and assess BP as indicated, work on managing back pain with mix of flexion and extension as tolerated, progress dead lifts and squats with weights, work on modified SLS tasks and confidence with curbs as indicated    Isidra Mings April Ma L Carlyon Nolasco, PT, DPT 10/09/2023, 7:59 AM

## 2023-10-14 ENCOUNTER — Ambulatory Visit: Payer: MEDICAID

## 2023-10-14 DIAGNOSIS — R2689 Other abnormalities of gait and mobility: Secondary | ICD-10-CM

## 2023-10-14 DIAGNOSIS — R2681 Unsteadiness on feet: Secondary | ICD-10-CM

## 2023-10-14 DIAGNOSIS — M6281 Muscle weakness (generalized): Secondary | ICD-10-CM

## 2023-10-14 DIAGNOSIS — M5459 Other low back pain: Secondary | ICD-10-CM

## 2023-10-14 NOTE — Therapy (Signed)
 OUTPATIENT PHYSICAL THERAPY NEURO TREATMENT   Patient Name: Shannon Travis MRN: [id number] DOB:1986-04-10, 38 y.o., female Today's Date: 10/14/2023  PCP: Sharon Seller, NP but switching to another provider in [facility] REFERRING PROVIDER: Jordan Hawks, PA-C  END OF SESSION:  PT End of Session - 10/14/23 0759     Visit Number 4    Number of Visits 7    Date for PT Re-Evaluation 11/07/23    Authorization Type [employer]    Equipment Utilized During Treatment Gait belt    Activity Tolerance Patient tolerated treatment well    Behavior During Therapy WFL for tasks assessed/performed              Past Medical History:  Diagnosis Date   Abnormality of gait    Anxiety    CP (cerebral palsy) (HCC)    mild per mom   Movement disorder    Pre-diabetes    Seizures (HCC)    Past Surgical History:  Procedure Laterality Date   CYSTECTOMY Left    leg   ORIF ANKLE FRACTURE Right 04/19/2022   Procedure: OPEN REDUCTION INTERNAL FIXATION (ORIF) right ANKLE FRACTURE, lateral malleolus;  Surgeon: Toni Arthurs, MD;  Location: [facility];  Service: Orthopedics;  Laterality: Right;   Patient Active Problem List   Diagnosis Date Noted   Fibula fracture 04/19/2022   B12 deficiency 01/18/2021   Weakness of both lower extremities 01/18/2021   Vitamin D deficiency 09/30/2020   Encounter for surveillance of contraceptive pills 12/17/2019   Mixed hyperlipidemia 12/17/2019   Family history of blood clots 11/25/2019   Osteopenia of multiple sites 11/24/2019   Arthralgia of right hip 11/16/2019   DDD (degenerative disc disease), lumbar 11/16/2019   Partial epilepsy with impairment of consciousness (HCC) 12/03/2012   Abnormality of gait 12/03/2012   Essential and other specified forms of tremor 12/03/2012   H/O mental disorder 05/15/2011   GAD (generalized anxiety disorder) 05/15/2011   Petit-mal epilepsy (HCC) 05/15/2011   Cerebral palsy (HCC)  05/02/2011   Benign essential tremor 05/02/2011   Bilateral polycystic ovarian syndrome 02/28/2011    ONSET DATE: 08/19/2023 (referral date)  REFERRING DIAG:  G80.2 (ICD-10-CM) - Spastic hemiplegic cerebral palsy (HCC) M54.50,G89.29 (ICD-10-CM) - Chronic low back pain R29.898 (ICD-10-CM) - Leg weakness   THERAPY DIAG:  No diagnosis found.  Rationale for Evaluation and Treatment: Rehabilitation  SUBJECTIVE:  SUBJECTIVE STATEMENT: Pt reports she is feeling back pain today. Pt reports compliance with HEP.  Pt accompanied by: self, mom Angela  PERTINENT HISTORY: anxiety, CP, pre-diabetes, seizures, s/p ORIF ankle fracture 04/19/2022  PAIN:  Are you having pain? Yes: NPRS scale: 4/10 Pain location: low back pain Pain description: grabbing/cramping feeling Aggravating factors: sitting for a long time Relieving factors: moving helps.   PRECAUTIONS: Fall and Other: seizure  WEIGHT BEARING RESTRICTIONS: No  FALLS: Has patient fallen in last 6 months? Yes. Number of falls 2 falls with episodes of lightheadedness  LIVING ENVIRONMENT: Lives with: lives with their family (lives with mom) Lives in: House/apartment Stairs: No Has following equipment at home: Environmental consultant - 4 wheeled, Ramped entry, and hurrycane  PLOF: Independent with gait, Independent with transfers, and Requires assistive device for independence  PATIENT GOALS: "being able to conquer my fear of crossing street, going up and down curbs, and no hesitating without pause."   OBJECTIVE:   DIAGNOSTIC FINDINGS:   MR Brain wo Contrast (08/28/2022): IMPRESSION: Unremarkable MRI scan of the brain with and without contrast   COGNITION: Overall cognitive status: History of cognitive impairments - at baseline   TODAY'S TREATMENT:                                                                                                                                BP: resting L arm 139/65, 89bpm Self Care: Gait training: 1 x 460' with [place] cane for warm up Walking fwd and bwd: 3 x 20 feet with [place] cane, VC to keep the cane more to her side to improve BOS Attempted to perform gait training with longer step length but due to intentional tremors, her gait became more unsteady and it was deferred. Lateral steps: 1 x 15 feet each way with [place] cane and CGA Pt provided education on wider BOS = stability and educated on where to place the cane and sequence when going up and down the curb with demo. Ramp: 1x with [place] cane Curb: 2x with [place] cane and min A and 3x with CGA, VC for sequencing  Practiced standing to half kneeling on foam pad and then standing back up. 2x with R knee down, attempted 1x with L knee down but patient unable to drop L knee to foam pad from standing position.   PATIENT EDUCATION: Education details: Additions to HEP Person educated: Patient and Parent Education method: Medical illustrator Education comprehension: verbalized understanding and needs further education  HOME EXERCISE PROGRAM: Seated PWR! Up and PWR! Rock 2 x 15 reps daily and as needed  Access Code: [email or web address] URL: [email or web address] Date: 10/09/2023 Prepared by: Vernon Prey April Kirstie Peri  Exercises - [place] Deadlift- Hands on Hips  - 1 x daily - 7 x weekly - 3 sets - 10 reps - Mini Squat  - 1 x daily - 7 x weekly - 3 sets - 10 reps -  Standing Hip Extension with Leg Bent and Support  - 1 x daily - 7 x weekly - 3 sets - 10 reps - Hip Abduction with Resistance Loop  - 1 x daily - 7 x weekly - 3 sets - 10 reps  GOALS: Goals reviewed with patient? Yes  SHORT TERM GOALS = LONG TERM GOALS due to length of POC   LONG TERM GOALS: Target date: 11/07/2023 (STG = LTG due to POC length)  Pt will be  independent with final HEP for improved low back pain management, strength, balance, transfers and gait. Baseline: Provided on 3/20 Goal status: INITIAL  2.  Patient will improve modified ODI score to 18% impairment or less indicate a clinically important improvement in low back pain.   Baseline: 18/50 = 36% impairment (eval) Goal status: INITIAL  3.  to be assessed / LTG written Baseline: 1281 with hurrycane Goal status: D/C anticipate near functional baseline    ASSESSMENT:  CLINICAL IMPRESSION: Today's session emphasized on practicing curbs and initiating floor transfers with the patient. Pt needs further training and education for curb and floor transfers.   OBJECTIVE IMPAIRMENTS: Abnormal gait, decreased balance, decreased knowledge of condition, decreased mobility, difficulty walking, decreased strength, impaired perceived functional ability, and postural dysfunction.   ACTIVITY LIMITATIONS: carrying, lifting, bending, sitting, standing, stairs, and transfers  PARTICIPATION LIMITATIONS: community activity  PERSONAL FACTORS: Time since onset of injury/illness/exacerbation and 1-2 comorbidities:    anxiety, CP, pre-diabetes, seizures, s/p ORIF ankle fracture 10/5/2023are also affecting patient's functional outcome.   REHAB POTENTIAL: Good  CLINICAL DECISION MAKING: Stable/uncomplicated  EVALUATION COMPLEXITY: Low  PLAN:  PT FREQUENCY: 2x/week  PT DURATION: 3 weeks  PLANNED INTERVENTIONS: Therapeutic exercises, Therapeutic activity, Neuromuscular re-education, Balance training, Gait training, Patient/Family education, Self Care, Joint mobilization, Stair training, DME instructions, Dry Needling, Electrical stimulation, Cryotherapy, Moist heat, Taping, Manual therapy, and Re-evaluation  PLAN FOR NEXT SESSION: Assess curbs as indicated, fall recovery, watch for episodes of lightheadedness and assess BP as indicated, work on managing back pain with mix of flexion and  extension as tolerated, progress dead lifts and squats with weights, work on modified SLS tasks and confidence with curbs as indicated ; Continue to practice curbs and floor transfers as tolerated by patient.   Ileana Ladd, PT, DPT 10/14/2023, 7:59 AM

## 2023-10-18 ENCOUNTER — Ambulatory Visit: Payer: MEDICAID | Attending: Physician Assistant | Admitting: Physical Therapy

## 2023-10-18 ENCOUNTER — Encounter: Payer: Self-pay | Admitting: Physical Therapy

## 2023-10-18 VITALS — BP 115/76 | HR 84

## 2023-10-18 DIAGNOSIS — R293 Abnormal posture: Secondary | ICD-10-CM | POA: Diagnosis present

## 2023-10-18 DIAGNOSIS — R269 Unspecified abnormalities of gait and mobility: Secondary | ICD-10-CM | POA: Insufficient documentation

## 2023-10-18 DIAGNOSIS — M6281 Muscle weakness (generalized): Secondary | ICD-10-CM

## 2023-10-18 DIAGNOSIS — R2681 Unsteadiness on feet: Secondary | ICD-10-CM

## 2023-10-18 DIAGNOSIS — M5459 Other low back pain: Secondary | ICD-10-CM | POA: Diagnosis present

## 2023-10-18 DIAGNOSIS — R2689 Other abnormalities of gait and mobility: Secondary | ICD-10-CM | POA: Diagnosis present

## 2023-10-18 NOTE — Therapy (Signed)
 OUTPATIENT PHYSICAL THERAPY NEURO TREATMENT   Patient Name: Shannon Travis MRN: [id number] DOB:Aug 02, 1985, 38 y.o., female Today's Date: 10/18/2023  PCP: Sharon Seller, NP but switching to another provider in [facility] REFERRING PROVIDER: Jordan Hawks, PA-C  END OF SESSION:  PT End of Session - 10/18/23 0801     Visit Number 5    Number of Visits 7    Date for PT Re-Evaluation 11/07/23    Authorization Type [employer]    PT Start Time [phone]    PT Stop Time 0840    PT Time Calculation (min) 42 min    Equipment Utilized During Treatment Gait belt    Activity Tolerance Patient tolerated treatment well    Behavior During Therapy WFL for tasks assessed/performed              Past Medical History:  Diagnosis Date   Abnormality of gait    Anxiety    CP (cerebral palsy) (HCC)    mild per mom   Movement disorder    Pre-diabetes    Seizures (HCC)    Past Surgical History:  Procedure Laterality Date   CYSTECTOMY Left    leg   ORIF ANKLE FRACTURE Right 04/19/2022   Procedure: OPEN REDUCTION INTERNAL FIXATION (ORIF) right ANKLE FRACTURE, lateral malleolus;  Surgeon: Toni Arthurs, MD;  Location: [facility];  Service: Orthopedics;  Laterality: Right;   Patient Active Problem List   Diagnosis Date Noted   Fibula fracture 04/19/2022   B12 deficiency 01/18/2021   Weakness of both lower extremities 01/18/2021   Vitamin D deficiency 09/30/2020   Encounter for surveillance of contraceptive pills 12/17/2019   Mixed hyperlipidemia 12/17/2019   Family history of blood clots 11/25/2019   Osteopenia of multiple sites 11/24/2019   Arthralgia of right hip 11/16/2019   DDD (degenerative disc disease), lumbar 11/16/2019   Partial epilepsy with impairment of consciousness (HCC) 12/03/2012   Abnormality of gait 12/03/2012   Essential and other specified forms of tremor 12/03/2012   H/O mental disorder 05/15/2011   GAD (generalized anxiety disorder)  05/15/2011   Petit-mal epilepsy (HCC) 05/15/2011   Cerebral palsy (HCC) 05/02/2011   Benign essential tremor 05/02/2011   Bilateral polycystic ovarian syndrome 02/28/2011    ONSET DATE: 08/19/2023 (referral date)  REFERRING DIAG:  G80.2 (ICD-10-CM) - Spastic hemiplegic cerebral palsy (HCC) M54.50,G89.29 (ICD-10-CM) - Chronic low back pain R29.898 (ICD-10-CM) - Leg weakness   THERAPY DIAG:  Muscle weakness (generalized)  Other abnormalities of gait and mobility  Unsteadiness on feet  Rationale for Evaluation and Treatment: Rehabilitation  SUBJECTIVE:  SUBJECTIVE STATEMENT: Patient reports that her back is overall doing well and denies pain. Reports wanting to review curbs and denies falls and near falls since last here.   Pt accompanied by: self, mom Angela  PERTINENT HISTORY: anxiety, CP, pre-diabetes, seizures, s/p ORIF ankle fracture 04/19/2022  PAIN:  Are you having pain? No  PRECAUTIONS: Fall and Other: seizure  WEIGHT BEARING RESTRICTIONS: No  FALLS: Has patient fallen in last 6 months? Yes. Number of falls 2 falls with episodes of lightheadedness  LIVING ENVIRONMENT: Lives with: lives with their family (lives with mom) Lives in: House/apartment Stairs: No Has following equipment at home: Environmental consultant - 4 wheeled, Ramped entry, and hurrycane  PLOF: Independent with gait, Independent with transfers, and Requires assistive device for independence  PATIENT GOALS: "being able to conquer my fear of crossing street, going up and down curbs, and no hesitating without pause."   OBJECTIVE:   DIAGNOSTIC FINDINGS:   MR Brain wo Contrast (08/28/2022): IMPRESSION: Unremarkable MRI scan of the brain with and without contrast   COGNITION: Overall cognitive status: History of cognitive  impairments - at baseline   TODAY'S TREATMENT:                                                                                                                               Vitals:   10/18/23 0804  BP: 115/76  Pulse: 84    Assessed seated at rest on RUE  Gait:  GAIT: Gait pattern: genu valgus, ataxic Distance walked: various clinic distances Assistive device utilized:  hurrycane and None Level of assistance: SBA with SPC and modI without AD Comments: overall safe with gait throughout session  CURB:  Level of Assistance: SBA and CGA Assistive device utilized: Print production planner Comments: progressed from CGA to SBA, still pauses at stop of curb but improved confidence from prior bout of care  Advance and retreat with [facility] ladder progression to cones x 4 cones over 20 feet - forward to backward gait transition and lateral stepping bidirectionally on second attempt (modI)  TherAct:  Part practice for floor recovery as noted below Half kneel with UE support on mat down to floor and then pull to stand with minA with R side up x 2, too challenging in today's session with L leg up (recommend plus 2 for full practice) On low mat - quadruped to half kneel x 2 times bilaterally with UE support on bench and CGA On low mat working on quadruped to side sit to back to roll back to quadruped to half kneel with UE support on bench with mod verbal cues and CGA for assistance Patient with good vaulting over R hip, preferred to side sit from quadruped to R side  PATIENT EDUCATION: Education details: Additions to HEP + floor recovery safety Person educated: Patient and Parent Education method: Medical illustrator Education comprehension: verbalized understanding and needs further education  HOME EXERCISE PROGRAM: Seated PWR! Up and PWR! Rock 2  x 15 reps daily and as needed  Access Code: [email or web address] URL: [email or web address] Date: 10/09/2023 Prepared by: Vernon Prey April Kirstie Peri  Exercises - [place] Deadlift- Hands on Hips  - 1 x daily - 7 x weekly - 3 sets - 10 reps - Mini Squat  - 1 x daily - 7 x weekly - 3 sets - 10 reps - Standing Hip Extension with Leg Bent and Support  - 1 x daily - 7 x weekly - 3 sets - 10 reps - Hip Abduction with Resistance Loop  - 1 x daily - 7 x weekly - 3 sets - 10 reps  GOALS: Goals reviewed with patient? Yes  SHORT TERM GOALS = LONG TERM GOALS due to length of POC   LONG TERM GOALS: Target date: 11/07/2023 (STG = LTG due to POC length)  Pt will be independent with final HEP for improved low back pain management, strength, balance, transfers and gait. Baseline: Provided on 3/20 Goal status: INITIAL  2.  Patient will improve modified ODI score to 18% impairment or less indicate a clinically important improvement in low back pain.   Baseline: 18/50 = 36% impairment (eval) Goal status: INITIAL  3.  to be assessed / LTG written Baseline: 1281 with hurrycane Goal status: D/C anticipate near functional baseline    ASSESSMENT:  CLINICAL IMPRESSION: Today's session emphasized review of curbs and progression of part practice for floor transfers. Given ataxia recommend plus 2 for full practice of floor transfer but today patient able to perform half kneel to stand with minA and CGA on part practice of quadruped to sidesit to supine to vault back into quadruped so anticipate will be able to perform with minA in future sessions if appropriately paced. Progressed to SBA with curbs. Continue POC.   OBJECTIVE IMPAIRMENTS: Abnormal gait, decreased balance, decreased knowledge of condition, decreased mobility, difficulty walking, decreased strength, impaired perceived functional ability, and postural dysfunction.   ACTIVITY LIMITATIONS: carrying, lifting, bending, sitting, standing, stairs, and transfers  PARTICIPATION LIMITATIONS: community activity  PERSONAL FACTORS: Time since onset of injury/illness/exacerbation and 1-2  comorbidities:    anxiety, CP, pre-diabetes, seizures, s/p ORIF ankle fracture 10/5/2023are also affecting patient's functional outcome.   REHAB POTENTIAL: Good  CLINICAL DECISION MAKING: Stable/uncomplicated  EVALUATION COMPLEXITY: Low  PLAN:  PT FREQUENCY: 2x/week  PT DURATION: 3 weeks  PLANNED INTERVENTIONS: Therapeutic exercises, Therapeutic activity, Neuromuscular re-education, Balance training, Gait training, Patient/Family education, Self Care, Joint mobilization, Stair training, DME instructions, Dry Needling, Electrical stimulation, Cryotherapy, Moist heat, Taping, Manual therapy, and Re-evaluation  PLAN FOR NEXT SESSION: Assess curbs as indicated, fall recovery, watch for episodes of lightheadedness and assess BP as indicated, work on managing back pain with mix of flexion and extension as tolerated, work on floor transfer full with plus 2, patient wanting to trial rollator, anticipate D/C at end of POC   Carmelia Bake, PT, DPT 10/18/2023, 9:30 AM

## 2023-10-20 ENCOUNTER — Other Ambulatory Visit: Payer: Self-pay | Admitting: Neurology

## 2023-10-23 ENCOUNTER — Ambulatory Visit: Payer: MEDICAID | Admitting: Physical Therapy

## 2023-10-23 DIAGNOSIS — R293 Abnormal posture: Secondary | ICD-10-CM

## 2023-10-23 DIAGNOSIS — R269 Unspecified abnormalities of gait and mobility: Secondary | ICD-10-CM

## 2023-10-23 DIAGNOSIS — M5459 Other low back pain: Secondary | ICD-10-CM

## 2023-10-23 DIAGNOSIS — M6281 Muscle weakness (generalized): Secondary | ICD-10-CM

## 2023-10-23 DIAGNOSIS — R2689 Other abnormalities of gait and mobility: Secondary | ICD-10-CM

## 2023-10-23 DIAGNOSIS — R2681 Unsteadiness on feet: Secondary | ICD-10-CM

## 2023-10-23 NOTE — Therapy (Signed)
 OUTPATIENT PHYSICAL THERAPY NEURO TREATMENT   Patient Name: JENISSA TYRELL MRN: [id number] DOB:[birth year], 38 y.o., female Today's Date: 10/23/2023  PCP: Sharon Seller, NP but switching to another provider in [facility] [place] REFERRING PROVIDER: Jordan Hawks, PA-C  END OF SESSION:  PT End of Session - 10/23/23 0802     Visit Number 6    Number of Visits 7    Date for PT Re-Evaluation 11/07/23    Authorization Type [employer]    PT Start Time 0800    PT Stop Time 0845    PT Time Calculation (min) 45 min    Equipment Utilized During Treatment Gait belt    Activity Tolerance Patient tolerated treatment well    Behavior During Therapy WFL for tasks assessed/performed               Past Medical History:  Diagnosis Date   Abnormality of gait    Anxiety    CP (cerebral palsy) (HCC)    mild per mom   Movement disorder    Pre-diabetes    Seizures (HCC)    Past Surgical History:  Procedure Laterality Date   CYSTECTOMY Left    leg   ORIF ANKLE FRACTURE Right 04/19/2022   Procedure: OPEN REDUCTION INTERNAL FIXATION (ORIF) right ANKLE FRACTURE, lateral malleolus;  Surgeon: Toni Arthurs, MD;  Location: [facility];  Service: Orthopedics;  Laterality: Right;   Patient Active Problem List   Diagnosis Date Noted   Fibula fracture 04/19/2022   B12 deficiency 01/18/2021   Weakness of both lower extremities 01/18/2021   Vitamin D deficiency 09/30/2020   Encounter for surveillance of contraceptive pills 12/17/2019   Mixed hyperlipidemia 12/17/2019   Family history of blood clots 11/25/2019   Osteopenia of multiple sites 11/24/2019   Arthralgia of right hip 11/16/2019   DDD (degenerative disc disease), lumbar 11/16/2019   Partial epilepsy with impairment of consciousness (HCC) 12/03/2012   Abnormality of gait 12/03/2012   Essential and other specified forms of tremor 12/03/2012   H/O mental disorder 05/15/2011   GAD (generalized anxiety disorder)  05/15/2011   Petit-mal epilepsy (HCC) 05/15/2011   Cerebral palsy (HCC) 05/02/2011   Benign essential tremor 05/02/2011   Bilateral polycystic ovarian syndrome 02/28/2011    ONSET DATE: 08/19/2023 (referral date)  REFERRING DIAG:  G80.2 (ICD-10-CM) - Spastic hemiplegic cerebral palsy (HCC) M54.50,G89.29 (ICD-10-CM) - Chronic low back pain R29.898 (ICD-10-CM) - Leg weakness   THERAPY DIAG:  Muscle weakness (generalized)  Other abnormalities of gait and mobility  Unsteadiness on feet  Other low back pain  Abnormality of gait and mobility  Abnormal posture  Rationale for Evaluation and Treatment: Rehabilitation  SUBJECTIVE:  SUBJECTIVE STATEMENT: Pt reports that her back pain is worse today, she was helping to lift/move chairs at [employer] ([facility]) to setup for chair exercises on Monday of this week. Pt did not feel the pain initially but later that night when she went to get into bed she felt her low back tightening up. She as able to get into bed but was unable to get comfortable, was tossing and turning. Pt also had difficulty standing up from her toilet due to the pain. Pt did try all the stretches she could think of and that she had reviewed with Maralyn Sago but nothing was helping with her pain.  Pt reports that her bed at home is very tall and she has to climb in on hands and knees, unable to lower the height of her bed.  Pt accompanied by: self, mom Angela  PERTINENT HISTORY: anxiety, CP, pre-diabetes, seizures, s/p ORIF ankle fracture 04/19/2022  PAIN:  Are you having pain? Yes: NPRS scale: 4/10 Pain location: low back Pain description: throbbing Aggravating factors: laying on stomach, laying on R side Relieving factors: heat, ibuprofen  PRECAUTIONS: Fall and Other: seizure  WEIGHT  BEARING RESTRICTIONS: No  FALLS: Has patient fallen in last 6 months? Yes. Number of falls 2 falls with episodes of lightheadedness  LIVING ENVIRONMENT: Lives with: lives with their family (lives with mom) Lives in: House/apartment Stairs: No Has following equipment at home: Environmental consultant - 4 wheeled, Ramped entry, and hurrycane  PLOF: Independent with gait, Independent with transfers, and Requires assistive device for independence  PATIENT GOALS: "being able to conquer my fear of crossing street, going up and down curbs, and no hesitating without pause."   OBJECTIVE:   DIAGNOSTIC FINDINGS:   MR Brain wo Contrast (08/28/2022): IMPRESSION: Unremarkable MRI scan of the brain with and without contrast   COGNITION: Overall cognitive status: History of cognitive impairments - at baseline   TODAY'S TREATMENT:                                                                                                                               There were no vitals filed for this visit.  TherAct: Simulation of patient's bed setup at home in order to problem solve sleeping positions and pain management: Standing on floor to quadruped to supine on mat table with SBA Pt transitions from supine to R sidelying to prone to L sidelying, fear of transitioning from sidelying to supine due to LE tremors and fear of falling off of her bed Problem-solved use of pillows for improved comfort in various positions: Prone with pillows under hips/stomach Sidelying with pillows between knees Supine with pillows under knees Pt with improved comfort with use of pillows in all these positions, provided handout for positions and pillow placement Practiced supine to L and R sidelying with cues to bridge to scoot hips closer to one side of the bed than the other to give patient plenty of space to roll  Recommended use of bedrail to assist patient with positioning herself in bed, handout provided for where to purchase bedrail  online Updated patient's mom at end of session on education and handouts provided this session   PATIENT EDUCATION: Education details: continue HEP, sleep positions with use of pillows, bedrail (see above) Person educated: Patient and Parent Education method: Explanation, Demonstration, and Handouts Education comprehension: verbalized understanding and needs further education  HOME EXERCISE PROGRAM: Seated PWR! Up and PWR! Rock 2 x 15 reps daily and as needed  Access Code: [email or web address] URL: [email or web address] Date: 10/09/2023 Prepared by: Vernon Prey April Kirstie Peri  Exercises - [place] Deadlift- Hands on Hips  - 1 x daily - 7 x weekly - 3 sets - 10 reps - Mini Squat  - 1 x daily - 7 x weekly - 3 sets - 10 reps - Standing Hip Extension with Leg Bent and Support  - 1 x daily - 7 x weekly - 3 sets - 10 reps - Hip Abduction with Resistance Loop  - 1 x daily - 7 x weekly - 3 sets - 10 reps   GOALS: Goals reviewed with patient? Yes  SHORT TERM GOALS = LONG TERM GOALS due to length of POC   LONG TERM GOALS: Target date: 11/07/2023 (STG = LTG due to POC length)  Pt will be independent with final HEP for improved low back pain management, strength, balance, transfers and gait. Baseline: Provided on 3/20 Goal status: INITIAL  2.  Patient will improve modified ODI score to 18% impairment or less indicate a clinically important improvement in low back pain.   Baseline: 18/50 = 36% impairment (eval) Goal status: INITIAL  3.  to be assessed / LTG written Baseline: 1281 with hurrycane Goal status: D/C anticipate near functional baseline    ASSESSMENT:  CLINICAL IMPRESSION: Emphasis of skilled PT session on assessing transitions in/out of bed, bed mobility, and problem-solving sleeping positions and use of pillows for improved patient comfort and management of her back pain. Pt is more comfortable in all sleeping positions with use of pillows as noted above. Pt could  also benefit from use of a bedrail to increase her independence and decrease her pain with rolling L/R and supine to sit transfers. She also likely strained her back muscles earlier this week by lifting chairs and can benefit from skilled PT services to work on strengthening of her core musculature and reviewing proper lifting techniques to prevent further injury. She also can benefit from further practice of floor transfers as this was not addressed this session due to back pain flare-up. Continue POC.    OBJECTIVE IMPAIRMENTS: Abnormal gait, decreased balance, decreased knowledge of condition, decreased mobility, difficulty walking, decreased strength, impaired perceived functional ability, and postural dysfunction.   ACTIVITY LIMITATIONS: carrying, lifting, bending, sitting, standing, stairs, and transfers  PARTICIPATION LIMITATIONS: community activity  PERSONAL FACTORS: Time since onset of injury/illness/exacerbation and 1-2 comorbidities:    anxiety, CP, pre-diabetes, seizures, s/p ORIF ankle fracture 10/5/2023are also affecting patient's functional outcome.   REHAB POTENTIAL: Good  CLINICAL DECISION MAKING: Stable/uncomplicated  EVALUATION COMPLEXITY: Low  PLAN:  PT FREQUENCY: 2x/week  PT DURATION: 3 weeks  PLANNED INTERVENTIONS: Therapeutic exercises, Therapeutic activity, Neuromuscular re-education, Balance training, Gait training, Patient/Family education, Self Care, Joint mobilization, Stair training, DME instructions, Dry Needling, Electrical stimulation, Cryotherapy, Moist heat, Taping, Manual therapy, and Re-evaluation  PLAN FOR NEXT SESSION: Assess curbs as indicated, fall recovery, watch for episodes of lightheadedness and assess BP as indicated, work on  managing back pain with mix of flexion and extension as tolerated, work on floor transfer full with plus 2, patient wanting to trial rollator, may add 1-2 visits to end of POC to address core strengthening and lifting  techniques???   Peter Congo, PT Peter Congo, PT, DPT, CSRS  10/23/2023, 8:50 AM

## 2023-10-25 ENCOUNTER — Encounter: Payer: Self-pay | Admitting: Physical Therapy

## 2023-10-25 ENCOUNTER — Ambulatory Visit: Payer: MEDICAID | Admitting: Physical Therapy

## 2023-10-25 VITALS — BP 121/79 | HR 73

## 2023-10-25 DIAGNOSIS — R269 Unspecified abnormalities of gait and mobility: Secondary | ICD-10-CM

## 2023-10-25 DIAGNOSIS — M6281 Muscle weakness (generalized): Secondary | ICD-10-CM | POA: Diagnosis not present

## 2023-10-25 DIAGNOSIS — R2689 Other abnormalities of gait and mobility: Secondary | ICD-10-CM

## 2023-10-25 DIAGNOSIS — R2681 Unsteadiness on feet: Secondary | ICD-10-CM

## 2023-10-25 DIAGNOSIS — M5459 Other low back pain: Secondary | ICD-10-CM

## 2023-10-25 NOTE — Therapy (Addendum)
 OUTPATIENT PHYSICAL THERAPY NEURO TREATMENT / RE-CERT   Patient Name: Shannon Travis MRN: [id number] DOB:[birth year], 38 y.o., female Today's Date: 10/25/2023  PCP: Sharon Seller, NP but switching to another provider in [facility] [place] REFERRING PROVIDER: Jordan Hawks, PA-C  END OF SESSION:  PT End of Session - 10/25/23 0749     Visit Number 7    Number of Visits 9   only able to schedule 1 more out due to schedule with patient time and need for plus two, add one more visit if needed   Date for PT Re-Evaluation 11/22/23    Authorization Type [employer]    PT Start Time [phone]    PT Stop Time 0834    PT Time Calculation (min) 45 min    Equipment Utilized During Treatment Gait belt    Activity Tolerance Patient tolerated treatment well;Other (comment)   limited by anxiety   Behavior During Therapy [facility] for tasks assessed/performed            Past Medical History:  Diagnosis Date   Abnormality of gait    Anxiety    CP (cerebral palsy) (HCC)    mild per mom   Movement disorder    Pre-diabetes    Seizures (HCC)    Past Surgical History:  Procedure Laterality Date   CYSTECTOMY Left    leg   ORIF ANKLE FRACTURE Right 04/19/2022   Procedure: OPEN REDUCTION INTERNAL FIXATION (ORIF) right ANKLE FRACTURE, lateral malleolus;  Surgeon: Toni Arthurs, MD;  Location: [facility];  Service: Orthopedics;  Laterality: Right;   Patient Active Problem List   Diagnosis Date Noted   Fibula fracture 04/19/2022   B12 deficiency 01/18/2021   Weakness of both lower extremities 01/18/2021   Vitamin D deficiency 09/30/2020   Encounter for surveillance of contraceptive pills 12/17/2019   Mixed hyperlipidemia 12/17/2019   Family history of blood clots 11/25/2019   Osteopenia of multiple sites 11/24/2019   Arthralgia of right hip 11/16/2019   DDD (degenerative disc disease), lumbar 11/16/2019   Partial epilepsy with impairment of consciousness (HCC) 12/03/2012    Abnormality of gait 12/03/2012   Essential and other specified forms of tremor 12/03/2012   H/O mental disorder 05/15/2011   GAD (generalized anxiety disorder) 05/15/2011   Petit-mal epilepsy (HCC) 05/15/2011   Cerebral palsy (HCC) 05/02/2011   Benign essential tremor 05/02/2011   Bilateral polycystic ovarian syndrome 02/28/2011    ONSET DATE: 08/19/2023 (referral date)  REFERRING DIAG:  G80.2 (ICD-10-CM) - Spastic hemiplegic cerebral palsy (HCC) M54.50,G89.29 (ICD-10-CM) - Chronic low back pain R29.898 (ICD-10-CM) - Leg weakness   THERAPY DIAG:  Muscle weakness (generalized) - Plan: PT plan of care cert/re-cert  Other abnormalities of gait and mobility - Plan: PT plan of care cert/re-cert  Unsteadiness on feet - Plan: PT plan of care cert/re-cert  Other low back pain - Plan: PT plan of care cert/re-cert  Abnormality of gait and mobility - Plan: PT plan of care cert/re-cert  Rationale for Evaluation and Treatment: Rehabilitation  SUBJECTIVE:  SUBJECTIVE STATEMENT: Pt reports that she wants to work on her thigh muscles today as they have been a little more sore. She also wants to work on walking as this is something she needs to do at [employer] ([facility]). She reports that she is really happy with how she is moving. Denies falls and near falls since last.   Pt accompanied by: self, mom Angela  PERTINENT HISTORY: anxiety, CP, pre-diabetes, seizures, s/p ORIF ankle fracture 04/19/2022  PAIN:  Are you having pain? Yes: NPRS scale: 3/10 Pain location: low back Pain description: throbbing Aggravating factors: laying on stomach, laying on R side Relieving factors: heat, ibuprofen  PRECAUTIONS: Fall and Other: seizure  WEIGHT BEARING RESTRICTIONS: No  FALLS: Has patient fallen in last 6  months? Yes. Number of falls 2 falls with episodes of lightheadedness  LIVING ENVIRONMENT: Lives with: lives with their family (lives with mom) Lives in: House/apartment Stairs: No Has following equipment at home: Environmental consultant - 4 wheeled, Ramped entry, and hurrycane  PLOF: Independent with gait, Independent with transfers, and Requires assistive device for independence  PATIENT GOALS: "being able to conquer my fear of crossing street, going up and down curbs, and no hesitating without pause."   OBJECTIVE:   DIAGNOSTIC FINDINGS:   MR Brain wo Contrast (08/28/2022): IMPRESSION: Unremarkable MRI scan of the brain with and without contrast   COGNITION: Overall cognitive status: History of cognitive impairments - at baseline   TODAY'S TREATMENT:                                                                                                                               Self Care:  Vitals:   10/25/23 0801  BP: 121/79  Pulse: 73   Vitals assessed at rest on RUE  Discussed breathing strategies to manage anxiety and use of rest breaks to manage tremors, discussed POC and adding few more visits to work on fall recovery and patient in agreement   TherAct:  Goals Check: Modified Oswestry Disability: 16/50 = 32% impairment, when asked if back has gotten better or worse since starting therapy, patient states better   [facility] PT Assessment - 10/25/23 0001       6 minute walk test results    Aerobic Endurance Distance Walked 1276   feet with SPC (SBA)   Endurance additional comments RPE: 5/10 with one standing rest break for breath recovery, notably gym more busy and had to avoid people 4x which may have impacted overall distance from eval, regardless near baseline            Floor Recovery: Patient educated in floor recovery this visit using teach-back for injury assessment and sequencing of task in clinic setting.  Discussion of transfer of skills to variable scenarios outside the  clinic.  Patient has most difficulty with push to stand, trialed with R leg up and ended up having L leg up, required modA boost at hips and pulled  upper body onto mat more than came to stand. Increased athetoid movements due to increase apprehension and mild R knee pain.  Performed 1 times. Caregiver Training:     Level of Assist:  ModA and modA for verbal cues for boost to stand with SBA of second therapist, will recovery review to increase safety    PATIENT EDUCATION: Education details: continue HEP, sleep positions with use of pillows, bedrail (see above) Person educated: Patient and Parent Education method: Explanation, Demonstration, and Handouts Education comprehension: verbalized understanding and needs further education  HOME EXERCISE PROGRAM: Seated PWR! Up and PWR! Rock 2 x 15 reps daily and as needed  Access Code: [email or web address] URL: [email or web address] Date: 10/09/2023 Prepared by: Vernon Prey April Kirstie Peri  Exercises - [place] Deadlift- Hands on Hips  - 1 x daily - 7 x weekly - 3 sets - 10 reps - Mini Squat  - 1 x daily - 7 x weekly - 3 sets - 10 reps - Standing Hip Extension with Leg Bent and Support  - 1 x daily - 7 x weekly - 3 sets - 10 reps - Hip Abduction with Resistance Loop  - 1 x daily - 7 x weekly - 3 sets - 10 reps   GOALS: Goals reviewed with patient? Yes  SHORT TERM GOALS = LONG TERM GOALS due to length of POC   LONG TERM GOALS: Target date: 11/07/2023 (STG = LTG due to POC length)  Pt will be independent with final HEP for improved low back pain management, strength, balance, transfers and gait. Baseline: Provided on 3/20, reports confidence in HEP Goal status: MET  2.  Patient will improve modified ODI score to 18% impairment or less indicate a clinically important improvement in low back pain.   Baseline: 18/50 = 36% impairment (eval); 16/50 = 32% impairment Goal status: NOT MET  3.  to be assessed / LTG written Baseline: 1281  with hurrycane Goal status: D/C anticipate near functional baseline   UPDATED LONG TERM GOALS: Target date: 11/22/2023   Pt will be able to perform floor transfer with minA to demonstrate improved safety if fall in the home. Baseline: Mod A Goal status: IN PROGRESS   ASSESSMENT:  CLINICAL IMPRESSION: Emphasis of skilled PT session on assessment of LTG. Recommending recerting for a few visits to improve confidence with floor transfer as required modA with push to stand and SBA of a second therapist due to increased anxiety and L knee discomfort. Patient did not meet low back pain goal but reports feeling overall improved with PT and states pain is well controlled. as anticipated near functional baseline. Continue POC.    OBJECTIVE IMPAIRMENTS: Abnormal gait, decreased balance, decreased knowledge of condition, decreased mobility, difficulty walking, decreased strength, impaired perceived functional ability, and postural dysfunction.   ACTIVITY LIMITATIONS: carrying, lifting, bending, sitting, standing, stairs, and transfers  PARTICIPATION LIMITATIONS: community activity  PERSONAL FACTORS: Time since onset of injury/illness/exacerbation and 1-2 comorbidities:    anxiety, CP, pre-diabetes, seizures, s/p ORIF ankle fracture 10/5/2023are also affecting patient's functional outcome.   REHAB POTENTIAL: Good  CLINICAL DECISION MAKING: Stable/uncomplicated  EVALUATION COMPLEXITY: Low  PLAN:  PT FREQUENCY: 2x/week + 1x week   PT DURATION: 3 weeks + 2 weeks   PLANNED INTERVENTIONS: Therapeutic exercises, Therapeutic activity, Neuromuscular re-education, Balance training, Gait training, Patient/Family education, Self Care, Joint mobilization, Stair training, DME instructions, Dry Needling, Electrical stimulation, Cryotherapy, Moist heat, Taping, Manual therapy, and Re-evaluation  PLAN FOR NEXT SESSION: Lifting techniques and floor  transfer with a plus 2 either D/C or add one visit as  needed    Carmelia Bake, PT, DPT  10/25/2023, 9:39 AM

## 2023-10-25 NOTE — Addendum Note (Signed)
 Addended by: Maryruth Eve A on: 10/25/2023 09:39 AM   Modules accepted: Orders

## 2023-11-06 ENCOUNTER — Ambulatory Visit: Payer: MEDICAID | Admitting: Physical Therapy

## 2023-11-06 ENCOUNTER — Encounter: Payer: Self-pay | Admitting: Physical Therapy

## 2023-11-06 DIAGNOSIS — M6281 Muscle weakness (generalized): Secondary | ICD-10-CM | POA: Diagnosis not present

## 2023-11-06 DIAGNOSIS — R2689 Other abnormalities of gait and mobility: Secondary | ICD-10-CM

## 2023-11-06 DIAGNOSIS — R2681 Unsteadiness on feet: Secondary | ICD-10-CM

## 2023-11-06 DIAGNOSIS — M5459 Other low back pain: Secondary | ICD-10-CM

## 2023-11-06 NOTE — Therapy (Signed)
 OUTPATIENT PHYSICAL THERAPY NEURO TREATMENT / DISCHARGE   Patient Name: Shannon Travis MRN: [id number] DOB:[birth year], 38 y.o., female Today's Date: 11/06/2023  PCP: Rainelle Bur, NP but switching to another provider in [facility] REFERRING PROVIDER: Tova Fresh, PA-C  PHYSICAL THERAPY DISCHARGE SUMMARY  Visits from Start of Care: 8  Current functional level related to goals / functional outcomes: Achieved 1/1 remaining LTG   Remaining deficits: Falls risk - well compensated, intermittent joint pain but reduces with compliance with HEP   Education / Equipment: Floor recovery, continued use of SPC, hip hinge mechanics   Patient agrees to discharge. Patient goals were partially met. Patient is being discharged due to maximized rehab potential.    END OF SESSION:  PT End of Session - 11/06/23 0803     Visit Number 8    Number of Visits 9    Date for PT Re-Evaluation 11/22/23    Authorization Type [employer]    PT Start Time 0802    PT Stop Time 0847    PT Time Calculation (min) 45 min    Equipment Utilized During Treatment Gait belt    Activity Tolerance Patient tolerated treatment well;Other (comment)    Behavior During Therapy WFL for tasks assessed/performed            Past Medical History:  Diagnosis Date   Abnormality of gait    Anxiety    CP (cerebral palsy) (HCC)    mild per mom   Movement disorder    Pre-diabetes    Seizures (HCC)    Past Surgical History:  Procedure Laterality Date   CYSTECTOMY Left    leg   ORIF ANKLE FRACTURE Right 04/19/2022   Procedure: OPEN REDUCTION INTERNAL FIXATION (ORIF) right ANKLE FRACTURE, lateral malleolus;  Surgeon: Amada Backer, MD;  Location: [facility];  Service: Orthopedics;  Laterality: Right;   Patient Active Problem List   Diagnosis Date Noted   Fibula fracture 04/19/2022   B12 deficiency 01/18/2021   Weakness of both lower extremities 01/18/2021   Vitamin D deficiency  09/30/2020   Encounter for surveillance of contraceptive pills 12/17/2019   Mixed hyperlipidemia 12/17/2019   Family history of blood clots 11/25/2019   Osteopenia of multiple sites 11/24/2019   Arthralgia of right hip 11/16/2019   DDD (degenerative disc disease), lumbar 11/16/2019   Partial epilepsy with impairment of consciousness (HCC) 12/03/2012   Abnormality of gait 12/03/2012   Essential and other specified forms of tremor 12/03/2012   H/O mental disorder 05/15/2011   GAD (generalized anxiety disorder) 05/15/2011   Petit-mal epilepsy (HCC) 05/15/2011   Cerebral palsy (HCC) 05/02/2011   Benign essential tremor 05/02/2011   Bilateral polycystic ovarian syndrome 02/28/2011    ONSET DATE: 08/19/2023 (referral date)  REFERRING DIAG:  G80.2 (ICD-10-CM) - Spastic hemiplegic cerebral palsy (HCC) M54.50,G89.29 (ICD-10-CM) - Chronic low back pain R29.898 (ICD-10-CM) - Leg weakness   THERAPY DIAG:  Muscle weakness (generalized)  Other abnormalities of gait and mobility  Unsteadiness on feet  Other low back pain  Rationale for Evaluation and Treatment: Rehabilitation  SUBJECTIVE:  SUBJECTIVE STATEMENT: Patient reports she is doing very well. Her exercises are helping her back pain and she is feeling ready for graduation. Denies falls and near falls.   Pt accompanied by: self, mom Angela  PERTINENT HISTORY: anxiety, CP, pre-diabetes, seizures, s/p ORIF ankle fracture 04/19/2022  PAIN:  Are you having pain? No  PRECAUTIONS: Fall and Other: seizure  WEIGHT BEARING RESTRICTIONS: No  FALLS: Has patient fallen in last 6 months? Yes. Number of falls 2 falls with episodes of lightheadedness  LIVING ENVIRONMENT: Lives with: lives with their family (lives with mom) Lives in:  House/apartment Stairs: No Has following equipment at home: Environmental consultant - 4 wheeled, Ramped entry, and hurrycane  PLOF: Independent with gait, Independent with transfers, and Requires assistive device for independence  PATIENT GOALS: "being able to conquer my fear of crossing street, going up and down curbs, and no hesitating without pause."   OBJECTIVE:   DIAGNOSTIC FINDINGS:   MR Brain wo Contrast (08/28/2022): IMPRESSION: Unremarkable MRI scan of the brain with and without contrast   COGNITION: Overall cognitive status: History of cognitive impairments - at baseline   TODAY'S TREATMENT:                                                                                                                               TherAct: Patient wanted to discuss getting up onto bed as she reports she has a tall bed and hard to get up in due to how she has to pushup through R UE, discussed and practiced with 6" step and high low mat raised to approximately height of patient's bed which did help some, recommend patient practice at home with caregiver present with caregiver's step as has one at home, also discussed trialing OT for wrist pain, patient wanting to hold at this time Discussed modification of exercises at day program, patient with pain bending to floor - educated on hip hinging to limit back strain and performed 3x without pain, discussed talking with caregivers at day program need for modifications with some exercises Discussed plopping on couch and using eccentric lower to reduce plopping, patient able to perform with excellent eccentric lower 3x low mat and 2x chair, recommend continued work at home   Floor Recovery: Patient educated in floor recovery this visit using teach-back for injury assessment and sequencing of task in clinic setting.  Discussion of transfer of skills to variable scenarios outside the clinic.  Patient has most difficulty with push to stand, trialed with R leg up with most  success today, required CGA. Increased athetoid movements but improved with half kneel break before transitioning to stand.  Performed 1 times. Caregiver Training:     Level of Assist:  CGA from one therapist and SBA from other  Gait:  1 x 50 feet and 1 x 115 feet with SPC (modI) during session as active recovery pain to help stretch out low back, reported reduction in back pain after performing (  back pain intermittent throughout session but tolerated well with active movement recovery exercises)    PATIENT EDUCATION: Education details: Continue HEP + theract session above Person educated: Patient and Parent Education method: Explanation, Demonstration, and Handouts Education comprehension: verbalized understanding and needs further education  HOME EXERCISE PROGRAM: Seated PWR! Up and PWR! Rock 2 x 15 reps daily and as needed  Access Code: [email or web address] URL: [email or web address] Date: 10/09/2023 Prepared by: Gellen April Marie Nonato  Exercises - Romanian Deadlift- Hands on Hips  - 1 x daily - 7 x weekly - 3 sets - 10 reps - Mini Squat  - 1 x daily - 7 x weekly - 3 sets - 10 reps - Standing Hip Extension with Leg Bent and Support  - 1 x daily - 7 x weekly - 3 sets - 10 reps - Hip Abduction with Resistance Loop  - 1 x daily - 7 x weekly - 3 sets - 10 reps   GOALS: Goals reviewed with patient? Yes  SHORT TERM GOALS = LONG TERM GOALS due to length of POC   LONG TERM GOALS: Target date: 11/07/2023 (STG = LTG due to POC length)  Pt will be independent with final HEP for improved low back pain management, strength, balance, transfers and gait. Baseline: Provided on 3/20, reports confidence in HEP Goal status: MET  2.  Patient will improve modified ODI score to 18% impairment or less indicate a clinically important improvement in low back pain.   Baseline: 18/50 = 36% impairment (eval); 16/50 = 32% impairment Goal status: NOT MET  3.  to be assessed / LTG  written Baseline: 1281 with hurrycane Goal status: D/C anticipate near functional baseline   UPDATED LONG TERM GOALS: Target date: 11/22/2023   Pt will be able to perform floor transfer with minA to demonstrate improved safety if fall in the home. Baseline: Mod A, CGA with push to stand Goal status: MET   ASSESSMENT:  CLINICAL IMPRESSION: Patient is discharging from skilled physical therapy services due to achieving 1/1 remaining LTG. Significant portion of session discussing activity modfication to meet patient's current mobility level while maximizing function and minimizing pain. Patient with CGA with floor recovery in today's session. Demonstrates appropriate understanding with HEP and ready for D/C.   OBJECTIVE IMPAIRMENTS: Abnormal gait, decreased balance, decreased knowledge of condition, decreased mobility, difficulty walking, decreased strength, impaired perceived functional ability, and postural dysfunction.   ACTIVITY LIMITATIONS: carrying, lifting, bending, sitting, standing, stairs, and transfers  PARTICIPATION LIMITATIONS: community activity  PERSONAL FACTORS: Time since onset of injury/illness/exacerbation and 1-2 comorbidities:    anxiety, CP, pre-diabetes, seizures, s/p ORIF ankle fracture 10/5/2023are also affecting patient's functional outcome.   REHAB POTENTIAL: Good  CLINICAL DECISION MAKING: Stable/uncomplicated  EVALUATION COMPLEXITY: Low  PLAN:  PT FREQUENCY: 2x/week + 1x week   PT DURATION: 3 weeks + 2 weeks   PLANNED INTERVENTIONS: Therapeutic exercises, Therapeutic activity, Neuromuscular re-education, Balance training, Gait training, Patient/Family education, Self Care, Joint mobilization, Stair training, DME instructions, Dry Needling, Electrical stimulation, Cryotherapy, Moist heat, Taping, Manual therapy, and Re-evaluation  PLAN FOR NEXT SESSION: NA - D/C with updated HEP   Coreen Devoid, PT, DPT  11/06/2023, 9:17 AM

## 2023-11-12 ENCOUNTER — Other Ambulatory Visit: Payer: Self-pay | Admitting: Neurology

## 2024-02-19 ENCOUNTER — Emergency Department (HOSPITAL_COMMUNITY): Payer: MEDICAID

## 2024-02-19 ENCOUNTER — Emergency Department (HOSPITAL_COMMUNITY)
Admission: EM | Admit: 2024-02-19 | Discharge: 2024-02-19 | Disposition: A | Discharge status: Home / Self Care | Payer: MEDICAID | Source: Skilled Nursing Facility | Attending: Emergency Medicine | Admitting: Emergency Medicine

## 2024-02-19 ENCOUNTER — Other Ambulatory Visit: Payer: Self-pay

## 2024-02-19 DIAGNOSIS — R251 Tremor, unspecified: Secondary | ICD-10-CM | POA: Insufficient documentation

## 2024-02-19 DIAGNOSIS — R0789 Other chest pain: Secondary | ICD-10-CM | POA: Insufficient documentation

## 2024-02-19 DIAGNOSIS — R569 Unspecified convulsions: Secondary | ICD-10-CM | POA: Diagnosis not present

## 2024-02-19 LAB — URINALYSIS, W/ REFLEX TO CULTURE (INFECTION SUSPECTED)
Bilirubin Urine: NEGATIVE
Glucose, UA: NEGATIVE mg/dL
Hgb urine dipstick: NEGATIVE
Ketones, ur: NEGATIVE mg/dL
Leukocytes,Ua: NEGATIVE
Nitrite: NEGATIVE
Protein, ur: NEGATIVE mg/dL
Specific Gravity, Urine: 1.02 (ref 1.005–1.030)
pH: 6 (ref 5.0–8.0)

## 2024-02-19 LAB — CBC WITH DIFFERENTIAL/PLATELET
Abs Immature Granulocytes: 0.01 K/uL (ref 0.00–0.07)
Basophils Absolute: 0 K/uL (ref 0.0–0.1)
Basophils Relative: 1 %
Eosinophils Absolute: 0.1 K/uL (ref 0.0–0.5)
Eosinophils Relative: 1 %
HCT: 43.8 % (ref 36.0–46.0)
Hemoglobin: 14.1 g/dL (ref 12.0–15.0)
Immature Granulocytes: 0 %
Lymphocytes Relative: 47 %
Lymphs Abs: 2.3 K/uL (ref 0.7–4.0)
MCH: 30.5 pg (ref 26.0–34.0)
MCHC: 32.2 g/dL (ref 30.0–36.0)
MCV: 94.6 fL (ref 80.0–100.0)
Monocytes Absolute: 0.5 K/uL (ref 0.1–1.0)
Monocytes Relative: 11 %
Neutro Abs: 2 K/uL (ref 1.7–7.7)
Neutrophils Relative %: 40 %
Platelets: 271 K/uL (ref 150–400)
RBC: 4.63 MIL/uL (ref 3.87–5.11)
RDW: 12.9 % (ref 11.5–15.5)
WBC: 5 K/uL (ref 4.0–10.5)
nRBC: 0 % (ref 0.0–0.2)

## 2024-02-19 LAB — COMPREHENSIVE METABOLIC PANEL WITH GFR
ALT: 14 U/L (ref 0–44)
AST: 18 U/L (ref 15–41)
Albumin: 3.8 g/dL (ref 3.5–5.0)
Alkaline Phosphatase: 60 U/L (ref 38–126)
Anion gap: 12 (ref 5–15)
BUN: 14 mg/dL (ref 6–20)
CO2: 19 mmol/L — ABNORMAL LOW (ref 22–32)
Calcium: 9.9 mg/dL (ref 8.9–10.3)
Chloride: 105 mmol/L (ref 98–111)
Creatinine, Ser: 0.69 mg/dL (ref 0.44–1.00)
GFR, Estimated: >60 mL/min (ref >60)
Glucose, Bld: 93 mg/dL (ref 70–99)
Potassium: 3.7 mmol/L (ref 3.5–5.1)
Sodium: 136 mmol/L (ref 135–145)
Total Bilirubin: 0.4 mg/dL (ref 0.0–1.2)
Total Protein: 7.6 g/dL (ref 6.5–8.1)

## 2024-02-19 LAB — LIPASE, BLOOD: Lipase: 36 U/L (ref 11–51)

## 2024-02-19 LAB — TSH: TSH: 1.383 u[IU]/mL (ref 0.350–4.500)

## 2024-02-19 LAB — CK: Total CK: 48 U/L (ref 38–234)

## 2024-02-19 LAB — TROPONIN I (HIGH SENSITIVITY)
Troponin I (High Sensitivity): 4 ng/L (ref <18)
Troponin I (High Sensitivity): 20 ng/L — ABNORMAL HIGH (ref <18)

## 2024-02-19 LAB — HCG, SERUM, QUALITATIVE: Preg, Serum: NEGATIVE

## 2024-02-19 LAB — CBG MONITORING, ED: Glucose-Capillary: 97 mg/dL (ref 70–99)

## 2024-02-19 LAB — MAGNESIUM: Magnesium: 1.8 mg/dL (ref 1.7–2.4)

## 2024-02-19 MED ORDER — SODIUM CHLORIDE 0.9 % IV BOLUS
1000.0000 mL | Freq: Once | INTRAVENOUS | Status: AC
  Administered 2024-02-19: 1000 mL via INTRAVENOUS

## 2024-02-19 NOTE — Discharge Instructions (Signed)
 Your history, exam, workup today was overall reassuring.  Your shaking today did not appear epileptiform on exam and your workup was otherwise reassuring.  Your heart enzymes were not critically elevated and I suspect your chest discomfort was due to some chest wall atypical pain.  Your pain had improved and your workup was otherwise reassuring.  You were well-appearing on reassessment and we feel you are now safe for discharge home.  Please continue your outpatient medications and follow-up with your primary team.  If any symptoms change or worsen acutely, please return to the nearest emergency department.

## 2024-02-19 NOTE — ED Notes (Signed)
 XR at bedside

## 2024-02-19 NOTE — ED Triage Notes (Addendum)
 Patient arrives via [facility] from [facility], hx of seizures. On keppra . Staff said patient had new tremors, staff eased patient to ground. Patient was on ground with convulsions x10 min, able to talk to EMS. Patient endorses no new activity or stress causing events today. Patient recollects transport but doesn't remember feeling off and being put on ground. No incontinence, not postictal. No oral trauma.   BP 120/90 HR 100-115 sinus tach O2 99 on room air CBG 170 per fire  Chest pain center chest- no 12 lead  Cerebral palsy with tremors at baseline

## 2024-02-19 NOTE — ED Notes (Signed)
 Pt d/c home per EDP order with mother. Discharge summary reviewed, pt verbalizes understanding. Off unit via WC. NAD.

## 2024-02-19 NOTE — ED Provider Notes (Addendum)
 [place] EMERGENCY DEPARTMENT AT [facility] Provider Note   CSN: [id number] Arrival date & time: 02/19/24  1205     Patient presents with: Seizures   Shannon Travis is a 38 y.o. female.   The history is provided by the patient and medical records. No language interpreter was used.  Seizures Seizure activity on arrival: no (shaking but did not appear epipeptiform)   Preceding symptoms: no nausea   Initial focality:  None Episode characteristics: generalized shaking   Episode characteristics: no abnormal movements, fully responsive, no stiffening, no tongue biting and responsive   Severity:  Unable to specify Timing:  Once Progression:  Unchanged Context: cerebral palsy   Recent head injury:  No recent head injuries PTA treatment:  None History of seizures: yes        Prior to Admission medications   Medication Sig Start Date End Date Taking? Authorizing Provider  Cholecalciferol (VITAMIN D3 GUMMIES ADULT PO) Take 2 tablets by mouth every Sunday.    [provider]  FLUoxetine  (PROZAC ) 40 MG capsule TAKE 1 CAPSULE (40 MG TOTAL) BY MOUTH DAILY. 10/21/23   Gayland Lauraine PARAS, NP  levETIRAcetam  (KEPPRA ) 1000 MG tablet TAKE 1 TABLET BY MOUTH TWICE A DAY 11/12/23   Gregg Lek, MD  norethindrone  (MICRONOR ) 0.35 MG tablet Take 1 tablet by mouth in the morning. 11/25/19   [provider]  propranolol  (INDERAL ) 10 MG tablet Take 1 tablet (10 mg total) by mouth 2 (two) times daily. 05/07/23 05/01/24  Camara, Amadou, MD    Allergies: Patient has no known allergies.    Review of Systems  Constitutional:  Negative for chills, fatigue and fever.  HENT:  Negative for congestion.   Eyes:  Negative for visual disturbance.  Respiratory:  Negative for cough, chest tightness, shortness of breath and wheezing.   Cardiovascular:  Positive for chest pain. Negative for palpitations.  Gastrointestinal:  Negative for abdominal distention, abdominal pain, constipation,  diarrhea, nausea and vomiting.  Genitourinary:  Negative for dysuria and frequency.  Musculoskeletal:  Negative for back pain and neck pain.  Skin:  Negative for rash and wound.  Neurological:  Positive for tremors. Negative for seizures (hx of), syncope, weakness, light-headedness and headaches.  Psychiatric/Behavioral:  Negative for agitation and confusion.   All other systems reviewed and are negative.   Updated Vital Signs BP 114/72 (BP Location: Left Arm)   Pulse (!) 110   Resp 20   SpO2 99%   Physical Exam Vitals and nursing note reviewed.  Constitutional:      General: She is not in acute distress.    Appearance: She is well-developed. She is not ill-appearing, toxic-appearing or diaphoretic.  HENT:     Head: Normocephalic and atraumatic.     Nose: No congestion or rhinorrhea.     Mouth/Throat:     Mouth: Mucous membranes are dry.  Eyes:     Extraocular Movements: Extraocular movements intact.     Conjunctiva/sclera: Conjunctivae normal.     Pupils: Pupils are equal, round, and reactive to light.  Cardiovascular:     Rate and Rhythm: Normal rate and regular rhythm.     Heart sounds: No murmur heard. Pulmonary:     Effort: Pulmonary effort is normal. No respiratory distress.     Breath sounds: Normal breath sounds. No wheezing, rhonchi or rales.  Chest:     Chest wall: No tenderness.  Abdominal:     General: Abdomen is flat.     Palpations: Abdomen  is soft.     Tenderness: There is no abdominal tenderness. There is no guarding or rebound.  Musculoskeletal:        General: No swelling or tenderness.     Cervical back: Neck supple.  Skin:    General: Skin is warm and dry.     Capillary Refill: Capillary refill takes less than 2 seconds.     Findings: No erythema or rash.  Neurological:     Mental Status: She is alert.     Cranial Nerves: No facial asymmetry.     Sensory: No sensory deficit.     Motor: Tremor present. No weakness or seizure activity.      Comments: Patient has shaking all over with tremors primarily in her face and arms but was not shaking in the legs.  Patient alert and oriented and answering questions appropriately during this.  Her shaking did not appear epileptiform in nature.  Psychiatric:        Mood and Affect: Mood normal.     (all labs ordered are listed, but only abnormal results are displayed) Labs Reviewed  COMPREHENSIVE METABOLIC PANEL WITH GFR - Abnormal; Notable for the following components:      Result Value   CO2 19 (*)    All other components within normal limits  URINALYSIS, W/ REFLEX TO CULTURE (INFECTION SUSPECTED) - Abnormal; Notable for the following components:   APPearance HAZY (*)    Bacteria, UA RARE (*)    All other components within normal limits  CBC WITH DIFFERENTIAL/PLATELET  LIPASE, BLOOD  TSH  MAGNESIUM   CK  HCG, SERUM, QUALITATIVE  CBG MONITORING, ED  TROPONIN I (HIGH SENSITIVITY)  TROPONIN I (HIGH SENSITIVITY)    EKG: EKG Interpretation Date/Time:  Wednesday February 19 2024 12:19:55 EDT Ventricular Rate:  98 PR Interval:  166 QRS Duration:  87 QT Interval:  316 QTC Calculation: 404 R Axis:   12  Text Interpretation: Sinus rhythm Borderline T wave abnormalities no prior ECG for comaprison No STEMI Confirmed by Ginger Barefoot ([id number]) on 02/19/2024 12:40:06 PM  Radiology: DG Chest Portable 1 View Result Date: 02/19/2024 CLINICAL DATA:  Chest pain EXAM: PORTABLE CHEST 1 VIEW COMPARISON:  None Available. FINDINGS: The heart size and mediastinal contours are within normal limits. Both lungs are clear. The visualized skeletal structures are unremarkable. No pneumothorax. IMPRESSION: No active disease. Electronically Signed   By: Franky Crease M.D.   On: 02/19/2024 13:57     Procedures   Medications Ordered in the ED  sodium chloride  0.9 % bolus 1,000 mL (0 mLs Intravenous Stopped 02/19/24 1348)                                    Medical Decision Making Amount and/or  Complexity of Data Reviewed Labs: ordered. Radiology: ordered.    Shannon Travis is a 38 y.o. female with a past medical history significant for anxiety, cerebral palsy, previous diagnosis of epilepsy on Keppra  who presents for tremors.  According to EMS report, patient was at [facility] and had worsening tremors and shaking.  She was gated to the ground and did not fall or hit her head.  She is still conversational during all of this and is just shaking more than her normal.  She normally has tremors she reports.  She is not altered and is not postictal.  She is not confused.  No oral  trauma or incontinence.  She otherwise says she is having some chest discomfort but denies any fevers, chills, congestion, cough, nausea, vomiting, constipation, diarrhea, or urinary changes.  Denies any headache or neck pain.  Denies any other complaints other than the worsened tremors and some of the chest discomfort.  Denies missing medications and otherwise is feeling at her baseline.  EMS reports her glucose was not hypoglycemic.  She otherwise has been doing well.  On exam, patient is following commands and moving all extremities.  She does have tremors in both arms and is shaking her head somewhat.  Her legs had intact sensation and strength and grip strength was symmetric.  Symmetric smile.  Speech is understandable.  Pupil symmetric and reactive with normal extraocular movements.  Chest is tender to palpation and lungs are clear.  Abdomen nontender.  Patient otherwise resting.  Given her lack of headache or head injury, will hold on CT of the head initially.  Will get screening labs and will get a chest pain workup with EKG, chest x-ray, and troponin.  She does have some dry mucous membranes we will give some fluids.  Will check for occult infection and get urinalysis and will get other electrolytes including a CK given all the shaking she is still doing.  Clinically I have low suspicion for  seizure given her normal mental status and ability to communicate well.  Anticipate reassessment after workup to determine disposition.       4:03 PM Patient reassessed and is resting calmly without any tremors with mother in the room.  Patient is feeling much better and has no complaints.  No chest pain now.  No shaking whatsoever.  Workup did not show any critical abnormalities.  I still agree to hold on CT of the head and family agrees as well.  She will follow-up with PCP if second troponin is reassuring.  Anticipate discharge shortly.   4:14 PM Second troponin did increase to 20 which is right near the edge of normal.  We had a shared decision made conversation with patient and mother and they do not want to wait for third troponin as her symptoms have completely resolved and she has no chest pain at this time.  She does not want to get further workup or advanced imaging and is feeling much better.  We again discussed the troponin rose and we would consider getting a third troponin or further workup but they would like to go home.  She will follow-up with her PCP and understood strict return precautions.  She had other questions or concerns and was discharged in good condition peer    Final diagnoses:  Episode of shaking  Atypical chest pain     Clinical Impression: 1. Episode of shaking   2. Atypical chest pain     Disposition: Discharge  Condition: Good  I have discussed the results, Dx and Tx plan with the pt(& family if present). He/she/they expressed understanding and agree(s) with the plan. Discharge instructions discussed at great length. Strict return precautions discussed and pt &/or family have verbalized understanding of the instructions. No further questions at time of discharge.    New Prescriptions   No medications on file    Follow Up: [facility] [address] [place]  [address] [phone]     [facility] Emergency Department at [facility] [address] [place]  [phone]       Ridhaan Dreibelbis,  Lonni PARAS, MD 02/19/24 1604    Arynn Armand, Lonni PARAS, MD 02/19/24 [phone]

## 2024-02-26 ENCOUNTER — Encounter: Payer: Self-pay | Admitting: Neurology

## 2024-02-26 ENCOUNTER — Ambulatory Visit (INDEPENDENT_AMBULATORY_CARE_PROVIDER_SITE_OTHER): Payer: MEDICAID | Admitting: Neurology

## 2024-02-26 VITALS — BP 115/80 | HR 84 | Resp 17 | Ht 67.0 in

## 2024-02-26 DIAGNOSIS — R269 Unspecified abnormalities of gait and mobility: Secondary | ICD-10-CM

## 2024-02-26 DIAGNOSIS — G809 Cerebral palsy, unspecified: Secondary | ICD-10-CM

## 2024-02-26 DIAGNOSIS — R251 Tremor, unspecified: Secondary | ICD-10-CM | POA: Diagnosis not present

## 2024-02-26 DIAGNOSIS — G801 Spastic diplegic cerebral palsy: Secondary | ICD-10-CM

## 2024-02-26 DIAGNOSIS — G40209 Localization-related (focal) (partial) symptomatic epilepsy and epileptic syndromes with complex partial seizures, not intractable, without status epilepticus: Secondary | ICD-10-CM | POA: Diagnosis not present

## 2024-02-26 MED ORDER — PROPRANOLOL HCL 10 MG PO TABS: 10.0000 mg | ORAL_TABLET | Freq: Three times a day (TID) | ORAL | 3 refills | Status: AC

## 2024-02-26 NOTE — Progress Notes (Signed)
 PATIENT: Shannon Travis DOB: 1986-02-22  REASON FOR VISIT: Follow up for cerebral palsy, gait disorder, seizures HISTORY FROM: Patient and her mother PRIMARY NEUROLOGIST: Dr. Gregg since Dr. Jenel retired   HISTORY OF PRESENT ILLNESS: Today 02/26/24 Patient presents today for follow-up, last visit was in August of last year.  Since then she has been doing well until a week ago when she was admitted to the hospital for tremors.  She had bilateral hand tremors, but she was awake and responding appropriately.  She was told that she did not have a seizure.  [facility].  She denies any increase stress and reports compliance with her medication. Denies any recent falls. She has completed physical therapy and continue to do the exercises at home.    INTERVAL HISTORY 02/26/2023 Shannon Travis presents today for follow-up, last visit was in January, since then she has been doing well.  She completed physical therapy and reports improvement in her gait.  Denies any falls.  She still use a cane with ambulation.  In terms of the tremor, she reports a trial of propranolol  was helpful.  Denies any seizure or seizure-like activity since last visit.  She is compliant with her medications, denies any side effects.  Overall she is doing well.   INTERVAL HISTORY 08/06/2022 Shannon Travis presents today for follow-up, she is accompanied by her mother.  Since last visit in July she has not had any additional seizures but continues to have falls.  Outside of the house she no longer uses a cane, and now she has a rolling walker.  She reported last month she fell and broke her ankle, she is set up to start physical therapy next week.  Her last fall was last Thursday.  She denies any dizziness, denies any lightheadedness before the fall, she reports her legs gave out.  Denies any concussion or loss of consciousness with the falls, denies any seizures prior to the fall.  She is compliant with the  seizure medications.  She still struggling with anxiety and tremors, when she is very nervous she has shaking of the bilateral upper extremities.  She reported the fluoxetine  has helped but has not completely resolved her symptoms.   INTERVAL HISTORY 01/31/2022:  Patient presents today for follow-up, she is accompanied by her mother.  Last visit was in January, since then she has not had any additional seizures.  She said that her last seizure was more than a year ago.  She is compliant with the Keppra  1000 mg twice daily.  She is also on fluoxetine  40 mg daily and she reported it helped with her anxiety.  She still having anxiety attack while at church singing and sometimes when crossing the road, denies any falls.  Doing well, last seizure was more than a year    INTERVAL HISTORY 08/02/21 Shannon Travis is here today with follow-up history of cerebral palsy, gait disorder, seizures.  On Keppra .  At last visit Prozac  was added to help with anxiety, generalized tremor events during pain or being emotionally upset. Claims took for some time the titration, then stopped. Doesn't remember if it helped. Anxiety continues, especially with ambulation in public places. Completed PT, it helped with gait, has a rolling walker now, uses as needed. Mostly uses during times of known anxiety with ambulation. No seizures (described as staring spells). Taking metformin to help with weight loss. No falls. Tremor in both hands, more in the right, mild in the head. Doing  well, here today with her mom.   HISTORY  01/18/2021 Dr. 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  The patient has staring episodes as her typical seizures.  The patient however has also had generalized tremor events unassociated with loss of consciousness that are usually associated with pain or being emotionally upset.  The patient had a fall on 19 December 2020 when she was  going out to [employer].  The patient injured her right leg, x-rays were all negative.  The patient has recovered from this.  During that emergency room visit, when she was upset due to the pain she had another typical generalized shaking event unassociated with loss of consciousness.  The typical staring events that represent her seizures have not been observed by her mother in many years.  The patient does not operate a motor vehicle currently.  She is followed through psychiatry for her anxiety, but she has not been on any medications for anxiety.  The patient remains on Keppra  1000 mg twice daily, it is not clear whether or not the Keppra  dosing has worsened the anxiety.  The patient appears to have a lot of fear of falling, she will freeze up frequently when she has to go up on the curb or go into an elevator.  She was to have physical therapy on her last visit but this never occurred.  The patient uses a cane for ambulation.  She does fall with some regularity.  REVIEW OF SYSTEMS: Out of a complete 14 system review of symptoms, the patient complains only of the following symptoms, and all other reviewed systems are negative.  See HPI  ALLERGIES: No Known Allergies  HOME MEDICATIONS: Outpatient Medications Prior to Visit  Medication Sig Dispense Refill   Cholecalciferol (VITAMIN D3 GUMMIES ADULT PO) Take 2 tablets by mouth once a week.     Fish Oil-Cholecalciferol (OMEGA-3 & VITAMIN D3 GUMMIES PO) Take 1 capsule by mouth daily.     FLUoxetine  (PROZAC ) 40 MG capsule TAKE 1 CAPSULE (40 MG TOTAL) BY MOUTH DAILY. 90 capsule 3   levETIRAcetam  (KEPPRA ) 1000 MG tablet TAKE 1 TABLET BY MOUTH TWICE A DAY 180 tablet 3   norethindrone  (MICRONOR ) 0.35 MG tablet Take 1 tablet by mouth in the morning.     propranolol  (INDERAL ) 10 MG tablet Take 1 tablet (10 mg total) by mouth 2 (two) times daily. 180 tablet 3   No facility-administered medications prior to visit.    PAST MEDICAL HISTORY: Past Medical  History:  Diagnosis Date   Abnormality of gait    Anxiety    CP (cerebral palsy) (HCC)    mild per mom   Movement disorder    Pre-diabetes    Seizures (HCC)     PAST SURGICAL HISTORY: Past Surgical History:  Procedure Laterality Date   CYSTECTOMY Left    leg   ORIF ANKLE FRACTURE Right 04/19/2022   Procedure: OPEN REDUCTION INTERNAL FIXATION (ORIF) right ANKLE FRACTURE, lateral malleolus;  Surgeon: Kit Rush, MD;  Location: [facility];  Service: Orthopedics;  Laterality: Right;    FAMILY HISTORY: Family History  Problem Relation Age of Onset   Healthy Mother     SOCIAL HISTORY: Social History   Socioeconomic History   Marital status: Single    Spouse name: Not on file   Number of children: 0   Years of education: 12   Highest education level: Not  on file  Occupational History   Not on file  Tobacco Use   Smoking status: Never   Smokeless tobacco: Never  Substance and Sexual Activity   Alcohol use: No   Drug use: No   Sexual activity: Not on file  Other Topics Concern   Not on file  Social History Narrative   Patient lives at home with her mother Jon Hummer.    Patient is currently working part time.    Patient is single.    Patient has no children.    Patient has high school education.    Social Drivers of Corporate investment banker Strain: Low Risk  (08/12/2023)   Received from [facility]   Overall Financial Resource Strain (CARDIA)    Difficulty of Paying Living Expenses: Not very hard  Food Insecurity: No Food Insecurity (08/12/2023)   Received from [facility]   Hunger Vital Sign    Within the past 12 months, you worried that your food would run out before you got the money to buy more.: Never true    Within the past 12 months, the food you bought just didn't last and you didn't have money to get more.: Never true  Transportation Needs: No Transportation Needs (08/12/2023)   Received from [facility] -  Transportation    Lack of Transportation (Medical): No    Lack of Transportation (Non-Medical): No  Physical Activity: Insufficiently Active (08/12/2023)   Received from [facility]   Exercise Vital Sign    On average, how many days per week do you engage in moderate to strenuous exercise (like a brisk walk)?: 2 days    On average, how many minutes do you engage in exercise at this level?: 30 min  Stress: No Stress Concern Present (08/12/2023)   Received from [facility] Occupational [facility] - Occupational Stress Questionnaire    Feeling of Stress : Only a little  Social Connections: Socially Isolated (08/12/2023)   Received from [facility]   Social Network    How would you rate your social network (family, work, friends)?: Little participation, lonely and socially isolated  Intimate Partner Violence: Not At Risk (08/12/2023)   Received from [facility]   HITS    Over the last 12 months how often did your partner physically hurt you?: Never    Over the last 12 months how often did your partner insult you or talk down to you?: Rarely    Over the last 12 months how often did your partner threaten you with physical harm?: Never    Over the last 12 months how often did your partner scream or curse at you?: Rarely   PHYSICAL EXAM  Vitals:   02/26/24 0958  BP: 115/80  Pulse: 84  Resp: 17  SpO2: 96%  Height: 5' 7 (1.702 m)    Body mass index is 45.81 kg/m.  Generalized: Well developed, in no acute distress   Neurological examination  Mentation: Alert oriented to time, place, history taking. Follows all commands speech and language fluent Cranial nerve II-XII: Pupils were equal round reactive to light. Extraocular movements were full, visual field were full on confrontational test. Facial sensation and strength were normal. Head turning and shoulder shrug  were normal and symmetric. Motor: The motor testing reveals 5 over 5 strength of all 4 extremities.  Good symmetric motor tone is noted throughout.  Sensory: Sensory testing is intact to soft touch on all 4 extremities.  No evidence of extinction is noted.  Coordination: Cerebellar testing reveals good finger-nose-finger but she has enhance physiological tremors. Athetoid movements of head and arms noted. Gait and station: Gait is slightly wide-based, mildly spastic gait, uses a walker   DIAGNOSTIC DATA (LABS, IMAGING, TESTING) - I reviewed patient records, labs, notes, testing and imaging myself where available.  Lab Results  Component Value Date   WBC 5.0 02/19/2024   HGB 14.1 02/19/2024   HCT 43.8 02/19/2024   MCV 94.6 02/19/2024   PLT 271 02/19/2024      Component Value Date/Time   NA 136 02/19/2024 1326   K 3.7 02/19/2024 1326   CL 105 02/19/2024 1326   CO2 19 (L) 02/19/2024 1326   GLUCOSE 93 02/19/2024 1326   BUN 14 02/19/2024 1326   CREATININE 0.69 02/19/2024 1326   CALCIUM 9.9 02/19/2024 1326   PROT 7.6 02/19/2024 1326   ALBUMIN 3.8 02/19/2024 1326   AST 18 02/19/2024 1326   ALT 14 02/19/2024 1326   ALKPHOS 60 02/19/2024 1326   BILITOT 0.4 02/19/2024 1326   GFRNONAA >60 02/19/2024 1326   No results found for: CHOL, HDL, LDLCALC, LDLDIRECT, TRIG, CHOLHDL No results found for: YHAJ8R No results found for: VITAMINB12 Lab Results  Component Value Date   TSH 1.383 02/19/2024    ASSESSMENT AND PLAN 38 y.o. year old female  has a past medical history of Abnormality of gait, Anxiety, CP (cerebral palsy) (HCC), Movement disorder, Pre-diabetes, and Seizures (HCC). here with:  1.  Cerebral palsy, athetoid   2.  Gait disorder   3.  Anxiety disorder   4.  Seizure disorder, under good control  -Tanishi is doing well today, Continue Keppra  1000 mg twice daily -Continue with Fluoxetine  40 mg daily -Will increase Propanolol to 10 three times daily. -Follow-up in 1 year    Pastor Falling, MD 02/26/2024, 10:44 AM [facility] [address], [address] [place], [place] [address] [phone]

## 2024-02-26 NOTE — Patient Instructions (Signed)
 Continue with levetiracetam  1000 mg twice daily Increase propranolol  to 10 mg 3 times daily for the tremors Continue follow-up PCP Set up care with medical weight management Follow-up in 1 year or sooner if worse

## 2024-03-06 ENCOUNTER — Other Ambulatory Visit (HOSPITAL_BASED_OUTPATIENT_CLINIC_OR_DEPARTMENT_OTHER): Payer: Self-pay

## 2024-03-06 MED ORDER — ONDANSETRON 8 MG PO TBDP
8.0000 mg | ORAL_TABLET | Freq: Three times a day (TID) | ORAL | 3 refills | Status: AC | PRN
  Filled 2024-03-06 – 2024-03-25 (×2): qty 20, 7d supply, fill #0

## 2024-03-06 MED ORDER — WEGOVY 0.25 MG/0.5ML ~~LOC~~ SOAJ
0.2500 mg | SUBCUTANEOUS | 0 refills | Status: AC
  Filled 2024-03-06: qty 2, 28d supply, fill #0

## 2024-03-07 ENCOUNTER — Other Ambulatory Visit (HOSPITAL_BASED_OUTPATIENT_CLINIC_OR_DEPARTMENT_OTHER): Payer: Self-pay

## 2024-03-09 ENCOUNTER — Other Ambulatory Visit (HOSPITAL_BASED_OUTPATIENT_CLINIC_OR_DEPARTMENT_OTHER): Payer: Self-pay

## 2024-03-10 ENCOUNTER — Other Ambulatory Visit (HOSPITAL_BASED_OUTPATIENT_CLINIC_OR_DEPARTMENT_OTHER): Payer: Self-pay

## 2024-03-17 ENCOUNTER — Other Ambulatory Visit (HOSPITAL_BASED_OUTPATIENT_CLINIC_OR_DEPARTMENT_OTHER): Payer: Self-pay

## 2024-03-18 ENCOUNTER — Telehealth: Payer: Self-pay | Admitting: Neurology

## 2024-03-18 NOTE — Telephone Encounter (Signed)
 [facility]) Checking on if have received fax on 8/18, 8/27 for prescription and administering propranolol  (INDERAL ) 10 MG tablet. Would like a call back.

## 2024-03-23 ENCOUNTER — Other Ambulatory Visit (HOSPITAL_BASED_OUTPATIENT_CLINIC_OR_DEPARTMENT_OTHER): Payer: Self-pay

## 2024-03-23 NOTE — Telephone Encounter (Signed)
 [facility]ise from [facility] called to informed that she received faxed but the dates are off  and was wondering if she would be able to stop by office to get paper worked signed with correct dates . Karna states that  [employer] were signed for September but  the dates need to say when Pt was prescribe medication .  Karna states  if MD or Nurse  wanted to follow up call at   [phone]

## 2024-03-24 ENCOUNTER — Other Ambulatory Visit (HOSPITAL_BASED_OUTPATIENT_CLINIC_OR_DEPARTMENT_OTHER): Payer: Self-pay

## 2024-03-24 NOTE — Telephone Encounter (Signed)
 Denised called back to informed she will be stopping by office today to get forms dated correctly

## 2024-03-24 NOTE — Telephone Encounter (Signed)
 [facility]) MarshallWill not need to come by your office due to have received the correct document.

## 2024-03-25 ENCOUNTER — Other Ambulatory Visit (HOSPITAL_BASED_OUTPATIENT_CLINIC_OR_DEPARTMENT_OTHER): Payer: Self-pay

## 2025-02-25 ENCOUNTER — Ambulatory Visit: Payer: MEDICAID | Admitting: Neurology
